# Patient Record
Sex: Male | Born: 1935 | Race: White | Hispanic: No | Marital: Married | Smoking: Former smoker
Health system: Southern US, Community
[De-identification: ages and names within clinical notes are randomized; demographics above are authoritative.]

## PROBLEM LIST (undated history)

## (undated) DIAGNOSIS — N189 Chronic kidney disease, unspecified: Secondary | ICD-10-CM

## (undated) DIAGNOSIS — E785 Hyperlipidemia, unspecified: Secondary | ICD-10-CM

## (undated) DIAGNOSIS — T4145XA Adverse effect of unspecified anesthetic, initial encounter: Secondary | ICD-10-CM

## (undated) DIAGNOSIS — T8859XA Other complications of anesthesia, initial encounter: Secondary | ICD-10-CM

## (undated) DIAGNOSIS — G500 Trigeminal neuralgia: Secondary | ICD-10-CM

## (undated) DIAGNOSIS — M1990 Unspecified osteoarthritis, unspecified site: Secondary | ICD-10-CM

## (undated) DIAGNOSIS — I639 Cerebral infarction, unspecified: Secondary | ICD-10-CM

## (undated) DIAGNOSIS — I10 Essential (primary) hypertension: Secondary | ICD-10-CM

## (undated) DIAGNOSIS — R5383 Other fatigue: Secondary | ICD-10-CM

## (undated) DIAGNOSIS — I359 Nonrheumatic aortic valve disorder, unspecified: Secondary | ICD-10-CM

## (undated) HISTORY — DX: Other fatigue: R53.83

## (undated) HISTORY — DX: Essential (primary) hypertension: I10

## (undated) HISTORY — PX: AORTIC VALVE REPLACEMENT: SHX41

## (undated) HISTORY — DX: Nonrheumatic aortic valve disorder, unspecified: I35.9

## (undated) HISTORY — DX: Hyperlipidemia, unspecified: E78.5

## (undated) HISTORY — DX: Trigeminal neuralgia: G50.0

---

## 1997-10-07 ENCOUNTER — Other Ambulatory Visit: Admission: RE | Admit: 1997-10-07 | Discharge: 1997-10-07 | Payer: Self-pay | Admitting: Cardiology

## 1999-03-16 HISTORY — PX: CORONARY ARTERY BYPASS GRAFT: SHX141

## 1999-06-22 ENCOUNTER — Ambulatory Visit (HOSPITAL_COMMUNITY): Admission: RE | Admit: 1999-06-22 | Discharge: 1999-06-22 | Payer: Self-pay | Admitting: Cardiology

## 1999-06-22 HISTORY — PX: CARDIAC CATHETERIZATION: SHX172

## 1999-07-13 ENCOUNTER — Encounter: Payer: Self-pay | Admitting: Surgery

## 1999-07-15 ENCOUNTER — Encounter: Payer: Self-pay | Admitting: Surgery

## 1999-07-15 ENCOUNTER — Inpatient Hospital Stay (HOSPITAL_COMMUNITY): Admission: RE | Admit: 1999-07-15 | Discharge: 1999-07-23 | Payer: Self-pay | Admitting: Surgery

## 1999-07-15 ENCOUNTER — Encounter (INDEPENDENT_AMBULATORY_CARE_PROVIDER_SITE_OTHER): Payer: Self-pay | Admitting: *Deleted

## 1999-07-16 ENCOUNTER — Encounter: Payer: Self-pay | Admitting: Surgery

## 1999-07-17 ENCOUNTER — Encounter: Payer: Self-pay | Admitting: Surgery

## 1999-07-22 ENCOUNTER — Encounter: Payer: Self-pay | Admitting: Surgery

## 1999-08-11 ENCOUNTER — Encounter (HOSPITAL_COMMUNITY): Admission: RE | Admit: 1999-08-11 | Discharge: 1999-11-09 | Payer: Self-pay | Admitting: Cardiology

## 2000-01-20 ENCOUNTER — Encounter (HOSPITAL_COMMUNITY): Admission: RE | Admit: 2000-01-20 | Discharge: 2000-04-19 | Payer: Self-pay | Admitting: Cardiology

## 2000-04-20 ENCOUNTER — Encounter (HOSPITAL_COMMUNITY): Admission: RE | Admit: 2000-04-20 | Discharge: 2000-06-17 | Payer: Self-pay | Admitting: Cardiology

## 2001-10-31 ENCOUNTER — Ambulatory Visit (HOSPITAL_BASED_OUTPATIENT_CLINIC_OR_DEPARTMENT_OTHER): Admission: RE | Admit: 2001-10-31 | Discharge: 2001-10-31 | Payer: Self-pay | Admitting: Plastic Surgery

## 2002-01-10 ENCOUNTER — Ambulatory Visit (HOSPITAL_COMMUNITY): Admission: RE | Admit: 2002-01-10 | Discharge: 2002-01-10 | Payer: Self-pay | Admitting: Ophthalmology

## 2009-10-20 ENCOUNTER — Encounter: Admission: RE | Admit: 2009-10-20 | Discharge: 2009-10-20 | Payer: Self-pay | Admitting: Neurology

## 2009-11-21 ENCOUNTER — Ambulatory Visit: Payer: Self-pay | Admitting: Cardiology

## 2010-03-20 ENCOUNTER — Ambulatory Visit: Payer: Self-pay | Admitting: Cardiology

## 2010-03-30 ENCOUNTER — Ambulatory Visit: Payer: Self-pay | Admitting: Cardiology

## 2010-06-02 ENCOUNTER — Other Ambulatory Visit: Payer: Self-pay | Admitting: Dermatology

## 2010-07-07 ENCOUNTER — Other Ambulatory Visit: Payer: Self-pay | Admitting: Cardiology

## 2010-07-07 DIAGNOSIS — R52 Pain, unspecified: Secondary | ICD-10-CM

## 2010-07-07 NOTE — Telephone Encounter (Signed)
PT USES [facility] MAIL ORDER AND WANTS HYDROCONDONE REFILLED. ANY QUESTIONS, CALL HOME # LISTED AND PT SAID LEAVE MESG IF NO ANSWER. PLACED CHART IN BOX.

## 2010-07-09 NOTE — Telephone Encounter (Signed)
Dr Patty Sermons signed rx and I faxed it to [facility]

## 2010-07-12 MED ORDER — HYDROCODONE-ACETAMINOPHEN 5-325 MG PO TABS: 1.0000 | ORAL_TABLET | Freq: Three times a day (TID) | ORAL | Status: AC | PRN

## 2010-07-31 NOTE — Op Note (Signed)
NAME:  Michael Blanchard, Michael Blanchard                          ACCOUNT NO.:  [id number]   MEDICAL RECORD NO.:  [id number]                   PATIENT TYPE:  OIB   LOCATION:  2890                                 FACILITY:  [facility]   PHYSICIAN:  Robert L. Dione Booze, [name]               DATE OF BIRTH:  [birth year]   DATE OF PROCEDURE:  01/10/2002  DATE OF DISCHARGE:                                 OPERATIVE REPORT   PREOPERATIVE DIAGNOSES:  Severe blepharochalasis with visual impairment.   POSTOPERATIVE DIAGNOSES:  Severe blepharochalasis with visual impairment.   OPERATION PERFORMED:  Upper eyelid blepharoplasties.   SURGEON:  Robert L. Dione Booze, [name]   ANESTHESIA:  1% Xylocaine with epinephrine.   INDICATIONS FOR PROCEDURE:  The patient was seen in my office on November 29, 2001 with reduced vision and significant difficulty with superior visual  field as well as some redundant skin of his upper eyelid.  It could be  corrected to 20/20 but almost had to lift up the skin to read.  The margin  reflex distance was about 1 to 2 mm but actually dropped down to 0 when he  was relaxed.  Confrontation fields were reduced superiorly.  The pupils,  motility, conjunctiva, cornea, anterior chamber and fundus exam was negative  and he does have early cataracts.  He has very severe blepharochalasis with  the skin completely covering his eyelashes and all virtually covering part  of each pupil.  This was documented with photographs and visual field  testing.  He decided he would opt for blepharoplasties and the plan was for  him to stop his aspirin seven days ahead of time.  He was followed medically  by Dr. Ronny Flurry.   JUSTIFICATION FOR PERFORMING PROCEDURE IN OUTPATIENT SETTING:  Routine.   JUSTIFICATION FOR OVERNIGHT STAY:  None.   DESCRIPTION OF PROCEDURE:  The patient arrived in the minor surgery room and  was prepped and draped in routine fashion.  A frontal nerve block was given  and some 1% Xylocaine  with epinephrine and was given to the skin of each  upper eyelid.  The skin to be removed was carefully demarcated and then  excised along with some underlying fatty tissue and bleeding was controlled  with the cautery and pressure.  Please note that there was a significant  amount of bleeding, much more than usual.  Each wound was closed with a  running 6-0 nylon suture and pressure patches were applied.  The patient  left the minor room having done well.    FOLLOW UP:  Patient to be seen in my office in five days to have the sutures  removed.  He is to remove the patches in several hours.  He is to use warm  compresses twice daily.  He is to use Polysporin ointment in his eyes at  night.  Robert L. Dione Booze, [name]    RLG/MEDQ  D:  01/10/2002  T:  01/10/2002  Job:  [id number]   cc:   Cassell Clement, MD

## 2010-07-31 NOTE — H&P (Signed)
[place]. [facility]  Patient:    Michael Blanchard, Michael Blanchard                         MRN: [id number] Adm. Date:  06/22/99 Attending:  Peter M. Swaziland, [name] CC:         Clovis Pu. Patty Sermons, [name]                         History and Physical  CHIEF COMPLAINT: Mr. Farquharson is a 77 year old white male who has a known history of severe aortic stenosis.  Recently he has been experiencing some symptoms of chest discomfort, particularly with strenuous exertion or exercise.  HISTORY OF PRESENT ILLNESS: Last week he has an episode with sudden light headed feeling where he felt swimmy-headed, and afterward developed tightness around his chest.  This was relieved with rest.  Due to the fact that he is now symptomatic it was recommended that he undergo evaluation for possible aortic valve replacement. The patient did have an echocardiogram in August 2000 which showed a peak gradient of 82 mmHg with severe aortic stenosis.  He had moderate LVH with normal systolic function.  He had an echocardiogram repeated yesterday which reportedly showed o change, but that report is not yet available.  PAST MEDICAL HISTORY:  1. Remote history of rectal bleeding 20 years ago.  2. Previous fracture of the arm.  PAST SURGICAL HISTORY: No prior surgery.  ALLERGIES: No known drug allergies.  CURRENT MEDICATIONS:  1. Cozaar 50 mg q.d.  2. Glucosamine t.i.d.  3. Phenergan capsules t.i.d.  4. Enteric-coated aspirin 325 mg q.d.  5. Vitamin E 400 IU q.d.  6. Aleve 2 tablets q.d.  7. Lipitor 10 mg q.d.  8. Multivitamin q.d.  SOCIAL HISTORY: The patient is married and has four children, in good health. e denies tobacco or alcohol use.  He drinks a moderate amount of caffeine.  He is  semi-retired from the trucking business.  FAMILY HISTORY: Father died at age 69 with a blood clot to the brain.  Mother died at age 21 with a stroke and cerebral hemorrhage.  One brother died with  heart disease.  REVIEW OF SYSTEMS: As per HPI.  PHYSICAL EXAMINATION:  GENERAL: The patient is an elderly white male in no apparent distress.  VITAL SIGNS: Blood pressure 164/82, pulse 80 with frequent extrasystole.  HEENT: PERRL.  Conjunctivae clear.  Oropharynx clear without upper and lower dental plates.  He is edentulous.  NECK: Supple, without JVD, adenopathy, thyromegaly.  His carotid upstrokes are markedly diminished and delayed with radiated murmur.  LUNGS: Clear.  CARDIAC: Regular rate and rhythm with normal S1 and soft S2, with grade 3/6 harsh systolic murmur heard best at the aortic area and radiating to the left sternal  border.  There is no diastolic murmur.  PMI is prominent.  ABDOMEN: Soft, nontender, without masses or hepatosplenomegaly.  EXTREMITIES: Without edema or cyanosis.  Pulses are 2+ and symmetric.  NEUROLOGIC: Nonfocal.  LABORATORY DATA: ECG shows normal sinus rhythm with frequent PVCs, probable LVH; ST-T wave changes; consider lateral and inferior ischemia.  Chest x-ray showed o active disease.  Cholesterol was 179, LDL 122, HDL 42, triglycerides 77.  Coags were normal except for PTT slightly high at 34.  CBC showed WBC 6800, hemoglobin 15.5, hematocrit 45.4, platelets 175,000.  IMPRESSION:  1. Severe aortic stenosis, degenerative, now symptomatic.  2. Borderline hypercholesterolemia.  3. Hypertension.  PLAN: The patient will be admitted for right and left heart catheterization, coronary angiography.  Further therapy pending these results. DD:  06/21/99 TD:  06/22/99 Job: [id number]

## 2010-07-31 NOTE — Cardiovascular Report (Signed)
[place]. [facility]  Patient:    Michael Blanchard, Michael Blanchard                         MRN: [id number] Proc. Date: 06/22/99 Attending:  Peter M. Swaziland, [name] CC:         Alleen Borne, [name]             Thomas A. Patty Sermons, [name]                        Cardiac Catheterization  INDICATION FOR PROCEDURE:  The patient is a 77 year old white male who presents  with recent symptoms with chest discomfort and shortness of breath.  He has known severe aortic stenosis.  ACCESS:  Via the right femoral artery and vein using the standard Seldinger technique.  EQUIPMENT:  A 6 French 4 cm right and left Judkins catheter, 6 French pigtail catheter, 6 French arterial sheath, 7 French arterial sheath, 8 French venous sheath, 7 French balloon-tipped Swan-Ganz catheter.  MEDICATIONS:  Local anesthesia with 1% Xylocaine.  CONTRAST:  Omnipaque 125 cc.  COMMENTARY:  The patient tolerated the procedure well without complications.  HEMODYNAMIC DATA:  Right atrial pressure is 18/14 with a mean of 14 mmHg. Right ventricular pressure is 58 with a EDP of 14 mmHg.  Pulmonary artery pressures 54/29 with a mean of 39 mmHg.  Pulmonary capillary wedge pressure is 28/25 with a mean of 23 mmHg.  Aortic pressure is 152/82 with a mean of 105.  Left ventricular pressure is 176 with an EDP of 27 mmHg.  By simultaneous recordings, aortic valve gradient is 29 mmHg with an aortic valve area of 0.9 cm sq.  Cardiac output by thermodilution is 5 L/min. with an index of 2.28.  By Hiram Comber, cardiac output is 3.95 with an index of 1.80.  There is no shunt.  There is no significant mitral valve gradient.  ANGIOGRAPHIC DATA:  LEFT VENTRICULAR ANGIOGRAPHY:  Left ventricular angiography is performed in the RAO view.  This demonstrates mild left ventricular enlargement.  There is severe hypokinesia of the mid to distal inferior wall and apex.  Overall, there is moderate left ventricular dysfunction with  ejection fraction estimated at 45%.  There is no mitral regurgitation or prolapse.  The aortic valve appears heavily  calcified with reduced mobility.  CORONARY ANGIOGRAPHY:  Left coronary artery:  The left coronary artery arises and distributes normally.  Left main:  The left main coronary artery is calcified without significant disease.  Left anterior descending:  The left anterior descending artery is diffusely diseased proximally in a segmental fashion with diffuse 80% narrowing.  There is an intermediate vessel which is somewhat small in caliber which has a 0% stenosis at its ostium.  Left circumflex:  The left circumflex coronary artery is a large dominant vessel. The first marginal vessel bifurcated into two large marginal vessels.  The second of these branches has a 90% stenosis proximally.  The ongoing circumflex at this point has a 50% narrowing before it terminates into the PDA.  Right coronary artery:  The right coronary artery is a small nondominant vessel. It has a 90% stenosis in the mid vessel prior to the terminal right ventricular  marginal branch.  FINAL INTERPRETATION: 1. Severe three-vessel obstructive atherosclerotic coronary artery disease. 2. Moderate to severe aortic stenosis. 3. Moderate left ventricular dysfunction. 4. Moderate pulmonary hypertension.  PLAN:  Would recommended coronary artery bypass surgery and  aortic valve replacement. DD:  06/25/99 TD:  06/26/99 Job: [id number]

## 2010-07-31 NOTE — Discharge Summary (Signed)
[place]. [facility]  Patient:    Michael Blanchard, Michael Blanchard                       MRN: [id number] Adm. Date:  [id number] Disc. Date: [id number] Attending:  [name] Dictator:   Eugenia Pancoast, P.A. CC:         Alleen Borne, [name]             Thomas A. Patty Sermons, [name]             Peter M. Swaziland, [name]                           Discharge Summary  DATE OF BIRTH:  [birth year]  FINAL DIAGNOSES: 1. Coronary artery disease. 2. Aortic valve stenosis. 3. Hypertension. 4. Hyperlipidemia. 5. Benign prostatic hypertrophy.  PROCEDURES:  Jul 15, 1999, coronary artery bypass graft x 5 with LIMA to the LAD, sequential saphenous vein graft to the first and second obtuse marginals, saphenous vein graft to the acute marginal, and a saphenous vein graft to the intermediate branch, aortic valve replacement with a #23 Carpentier-Edwards pericardial tissue valve.  SURGEON:  Dr. Laneta Simmers.  HISTORY OF PRESENT ILLNESS:  This is a 77 year old gentleman with known history of aortic valve stenosis who reports a several-month history of decreased exercise ______ .  Approximately two to three weeks prior to this admission he had an episode of sudden flushed feeling, lightheadedness, and dizziness, followed by a tight squeezing sensation around his chest.  This was relieved with rest.  The patient had never had any of these symptoms before. He had no associated shortness of breath.  He had no lower extremity edema. Denied any PND or orthopnea.  He had an echocardiogram done August 2000, which revealed a peak transaortic valvular gradient of 82 mmHg, consistent with severe aortic valve stenosis and moderate left ventricular hypertrophy with normal systolic function at that time.  His echocardiogram was repeated on June 21, 1999, which showed no significant change.  He subsequently underwent a cardiac catheterization performed by Dr. Peter Swaziland on June 22, 1999, which  showed severe aortic valve stenosis with an aortic valve gradient of 29 mmHg and a calculated aortic valve area of 0.9 sq cm.  The patient had moderate pulmonary hypertension with a PA pressure of ______ and a wedge pressure of 20/25 with a mean of 32.  His right ventricular pressure was 58/16 and his mean right atrial pressure was 14.  His left ventriculogram showed severe inferior hypokinesis with an ejection fraction of 45%.  Aortic valve was heavily calcified.  There was trivial mitral regurgitation.  There was a 78% long segmental stenosis of the proximal LAD.  The circumflex was dominant, with a 99% stenosis of the second marginal branch.  There was also a 90% ostial stenosis of a small intermediate artery.  The right coronary artery was a small nondominant vessel with a 90% mid vessel stenosis.  Because of these findings, he was referred to Dr. Laneta Simmers for surgical revascularization. Dr. Laneta Simmers spoke with the patient.  He discussed the surgery.  Risks and benefits were explained to the patient.  The patient understood and agreed to surgery.  HOSPITAL COURSE:  The patient was admitted to [facility] on Jul 15, 1999.  At that time he underwent coronary artery bypass x 5 with an LIMA to the LAD, sequential saphenous vein graft to  the first and second obtuse marginals, a saphenous vein graft to the intermediate branch, a saphenous vein graft to the acute marginal.  He then underwent aortic valve replacement with a #23 Carpentier-Edwards pericardial tissue valve.  The patient tolerated the procedure well.  No intraoperative complications occurred.  Postoperatively, the patient did quite well.  No untoward events occurred during his stay.  His first postoperative day, he was ready for transfer to the stepdown unit. There, he continued to progress satisfactorily.  He went through his cardiac rehabilitation without difficulty.  He was up and ambulating by the second postoperative day.   He was started on his Coumadin.  The patient did have some evidence of atrial fibrillation that was noted.  At the time of discharge, he was in sinus rhythm.  He had no other complaints during his progress.  His incisions were healing satisfactorily.  There was noted redness surrounding the lower extremity vein harvest incision.  The patient was started on Keflex prophylactically for this and would continue on this as an outpatient.  The patient continued to do well.  There were no signs of any overt infection.  By Jul 22, 1999, he was doing quite well, tolerating a diet satisfactorily, having no complaints, and was at this time prepared for discharge.  DISCHARGE MEDICATIONS: 1. Lasix 40 mg q.d. x 7 days. 2. K-Dur 20 mEq q.d. x 7 days. 3. Keflex 500 mg t.i.d. x 8 days. 4. Flomax 0.4 mg q.d. 5. Coumadin 5 mg as directed. 6. Zocor 20 mg q.d. 7. Cozaar 50 mg q.d. 8. Lopressor 75 mg b.i.d. 9. Percocet 1-2 p.o. q.4-6h. p.r.n. pain.  FOLLOW-UP:  The patient will follow up with Dr. Patty Sermons in two weeks.  He will see Dr. Laneta Simmers initially on Tuesday, Jul 28, 1999, at 12:15.  Dr. Laneta Simmers would like to follow him up to recheck his incision at that time.  DISCHARGE INSTRUCTIONS:  The patient will continue on Coumadin as an outpatient.  His INR was 2.3 on Jul 22, 1999.  At the time of this dictation, the discharge INR was pending.  The patient was given 2.5 mg of Coumadin at that time on Jul 22, 1999.  The patient will have his blood drawn at home by home health nurses on Friday, and the results will be called to Dr. Jenness Corner office.  Dr. Patty Sermons should take care of the patients Coumadin dosing.  DISPOSITION:  The patient is discharged home on Jul 23, 1999.  CONDITION ON DISCHARGE:  Satisfactory and stable condition. DD:  07/22/99 TD:  07/24/99 Job: [id number]

## 2010-07-31 NOTE — Op Note (Signed)
[place]. [facility]  Patient:    OTHAR, CURTO                       MRN: [id number] Proc. Date: 07/15/99 Adm. Date:  [id number] Attending:  [name] CC:         Alleen Borne, [name]             CVTS Office             Thomas A. Patty Sermons, [name]             Cardiac Catheterization Lab                           Operative Report  PREOPERATIVE DIAGNOSIS:  Severe three-vessel coronary artery disease.  Severe aortic stenosis.  POSTOPERATIVE DIAGNOSIS:  Severe three-vessel coronary artery disease.  Severe aortic stenosis.  OPERATIVE PROCEDURES: 1. Median sternotomy. 2. Extracorporeal circulation. 3. Coronary artery bypass graft surgery x 5.  Using the left internal mammary    artery graft to the left anterior descending coronary artery, with a    saphenous vein graft to the intermediate coronary artery, a sequential    saphenous vein graft to the first and second obtuse marginal branch of the    left circumflex coronary artery, and a saphenous vein graft to the acute    marginal branch of the right coronary artery; and aortic valve replacement    using a 23 mm Carpentier-Edwards pericardial valve.  ATTENDING SURGEON:  Alleen Borne, [name]  ASSISTANT:  Salvatore Decent. Cornelius Moras, [name]  SECOND ASSISTANT:  Eugenia Pancoast, P.A.  ANESTHESIA:  General endotracheal.  CLINICAL HISTORY:  This patient is a 77 year old gentleman with a known history of aortic stenosis, who reported a several-month history of decreased exercise tolerance and fatigue.  Approximately two to three weeks prior to presentation he had an episode of lightheadedness and dizziness, with a sudden flushed feeling, followed by a tight squeezing sensation around the chest.  An echocardiogram on June 21, 1999 showed no significant change from an echocardiogram done August 2000, which showed a peak transvalvular gradient across the aortic valve of 82 mmHg.  This consistent with severe  aortic stenosis, as well as moderate left ventricular hypertrophy and normal left ventricular systolic function.  He underwent cardiac catheterization on March 24, 1999, which showed severe aortic stenosis with an aortic valve gradient of 29 mmHg and a calculated valve area of 0.9 sq cm.  There was moderate pulmonary hypertension, with a PA pressure of 54/29; a wedge pressure of 28, with a mean wedge pressure of 33. His right ventricular pressure was elevated at 58/16, with a mean right atrial pressure of 14.  Left ventriculography showed severe inferior hypokinesis with an ejection fraction of 45%.  The aortic valve was heavily calcified.  There was trivial mitral regurgitation.  There is a 70-80% long segmental stenosis in the proximal LAD.  The left circumflex is dominant, with a 99% stenosis of the second marginal branch.  There was also some narrowing of the proximal portion of the left circumflex.  There was a 90% ostial stenosis in the small intermediate artery.  The right coronary artery was a small nondominant vessel with a 90% mid vessel stenosis prior to the acute marginal branch.  After review of the angiogram with the examiner and the patient, it was felt that coronary artery bypass graft surgery and aortic valve  replacement was the best treatment.  I discussed the operative procedure with the patient and his wife, including the alternatives, benefits, and risks (including: bleeding, possible blood transfusion, infection, stroke, myocardial infarction and death).  They understood and agreed to proceed.  We also discussed the type of valve to use, and I felt that a pericardial valve would be the best choice, given his age of 89 with three-vessel coronary disease.  Also significant arthritis and frequent use of nonsteroidal anti-inflammatory agents.  We discussed the pros and cons of tissue versus mechanical valves; felt that a tissue valve would be the best choice for him.   He and his wife understood all of this and agreed with that decision.  OPERATIVE PROCEDURE:  The patient was taken to the operating room and placed on the table in the supine position.  After induction of general endotracheal anesthesia, a Foley catheter was placed in the bladder using sterile technique.  Then, a transesophageal echocardiogram was performed by Cardiology.  This showed left ventricular hypertrophy with good overall left ventricular systolic function, but severe inferior hypokinesis.  The aortic valve was heavily calcified and stenotic.  There is no significant mitral regurgitation, but there was mitral annular calcification.  Then, the chest, abdomen and both lower extremities were prepped and draped in the usual sterile manner.  The chest was entered through a median sternotomy incision.  The pericardium left of the midline.  Examination of the heart showed good ventricular contractility.  The ascending aorta had no palpable plaques in it.  Then, the left internal mammary artery was harvested from the chest wall to the ______ .  This was a medium-caliber vessel, with excellent blood flow through it.  At the same time, a 7 mm greater saphenous vein was harvested from the right leg; this vein was of medium size and good quality.  Then the patient was heparinized and when adequate ACT was achieved, the distal ascending aorta was cannulated using a 22-French aortic cannula for arterial inflow.  Venous outflow was achieved using a large two-stage venous cannula through the right atrial appendage.  An antegrade cardioplegia and vent cannula was inserted in the aortic root.  A retrograde cardioplegia cannula was inserted through the right atrium into the coronary sinus; and a left ventricular vent was inserted through the the right superior pulmonary vein.  The patient was placed on cardiopulmonary bypass and the distal coronaries identified.  The LAD was a large,  graftable vessel with no significant distal  disease in it.  The intermediate coronary artery was a small to medium-sized vessel, and was graftable.  The first marginal was a small to medium-sized vessel that was graftable.  The second marginal branch was a large vessel that was diffusely diseased, extending out to the end of the artery.  The distal portion of the left circumflex terminated as a small, bifurcating branch, that was lying quite high on the posterior wall; was not felt to be graftable.  The right coronary artery gave off a small to medium-sized acute marginal branch that was suitable for grafting.  Then the aorta was crossclamped and 500 cc of cold blood antegrade cardioplegia was administered in the aortic root, with quick arrest of the heart.  This was followed by 300 cc of cold blood retrograde cardioplegia. Systemic hyperthermia at 20 degrees Centigrade and topical hyperthermia iced saline was used.  The temperature probe was placed in the subcutaneous insulating pad and the pericardium.  The first distal anastomosis was performed  to the first marginal branch.  The internal diameter was 1.6 mm.  The conduit used was a 7 mm greater saphenous vein.   The anastomosis performed in a sequential side-to-side manner using continuous 7-0 Prolene suture.  Flow was admitted to the graft, and it was excellent.  The second distal anastomosis was performed to the second marginal branch. The internal diameter was 1.75 mm.  Conduit used was the same 7 mm greater saphenous vein.  The anastomosis was performed in a sequential end-to-side manner, using continuous 7-0 Prolene suture. Flow was admitted to the graft; it was excellent.  Then another dose of cardioplegia was given in the retrograde manner.  The third distal anastomosis was performed to the intermediate coronary artery.  The internal diameter was 1.6 mm.  The conduit used was a second 7 mm greater saphenous vein.  The  anastomosis was performed in an end-to-side manner using continuous 7-0 Prolene suture.   Flow was admitted through the graft; it was excellent.  The fourth distal anastomosis was performed to the acute marginal branch.  The internal diameter was 1.5 mm.  The conduit used was a third 7 mm greater saphenous vein.  The anastomosis was performed in an end-to-side manner using continuous 7-0 Prolene suture.  Flow was admitted through the graft; it was excellent.  Then, another dose of retrograde cardioplegia was given. Additional doses were given at approximately 20-30 min intervals, maintaining myocardial temperature around 10 degrees Centigrade.  Then, the attention was turned to the fifth distal anastomosis.  This was performed through the mid portion of the left anterior descending coronary artery.  The internal diameter was about 2.5 mm.  The conduit used was the left internal mammary artery.  This was brought through an opening in the left pericardium, anterior to the phrenic nerve.  It was then anastomosed to the LAD in an end-to-side manner using continuous 8-0 Prolene suture.  The pedicle was tacked to epicardium with 6-0 Prolene sutures.  Then, attention was turned to aortic valve replacement.  The aorta was opened at the sinotubular junction in a transverse fashion.  Examination of the native aortic valve showed that it was a three-leaflet valve, with heavy calcification of the leaflets (which were immobile).  There was also heavy annular calcification.  The native valves were excised.  The annulus was decalcified with rongeurs, taking care to remove all particulate debris. Then the aortic root and left ventricle were irrigated with large volume of iced saline.  The annulus was sized and a 23 mm Carpentier-Edwards pericardial valve was chosen. Then a series of pledgeted 2-0 Ethibond horizontal mattress sutures were placed around the annulus, with the pledgets in a  subangular position.  The sutures were placed through the sewing ring and the valve lowered into place.  The sutures were then tied sequentially.  The valve seated nicely and there was no subvalvular obstruction.  The patient was then rewarmed to 37 degrees Centigrade.  The aortotomy was closed in two layers using continuous 4-0 Prolene suture, with pledgets on both ends.  Before tying the suture, the left side of the heart was deaired as much as possible.  The sutures were then tied.  The patients head was placed in a Trendelenburg position.  The clamp was removed from the aortic pedicle. There was rapid warming with ventricular septum.  The crossclamp was then removed; time at 141 minutes.  There was spontaneous return of ventricular fibrillation, and the patient was then defibrillated into sinus rhythm.  Then, a partial occlusion clamp was placed in the aortic root. The three proximal vein graft anastamoses were performed in end-to-side manner using continuous 6-0 Prolene suture.  The clamp was removed and the vein graft deaired, and the clasp in front of it.  The proximal and distal anastomosis appeared hemostatic.  The lie of the graft was satisfactory.  Graft margins were placed around the proximal anastomosis.  Two temporary right ventricular and right atrial pacing wires were placed and brought up through the skin.  With the patient having been rewarmed to 37 degrees Centigrade, further deairing maneuvers were performed.  Then the patient was weaned from cardiopulmonary bypass on low-dose Dopamine.  Total bypass time was 210 mins. Cardiac function appeared excellent, with cardiac output of 7 mm/min.  A transesophageal echocardiogram showed a normal functioning aortic valve prosthesis, with no evidence of paravalvular leak.  There was no significant mitral regurgitation.  Left ventricular function appeared good.  Then the patient was given Protamine intravenously.  Aortic  cannulae were removed.  The left ventricular vent and the retrograde cardioplegia cannulae had already been removed.  Hemostasis was achieved.  The patient was given platelets due to low platelet counts and diffuse oozing from the start of the case.  Then, three chest tubes were placed to the posterior pericardium; one  in the left pleural space, one in the antrum and one in the mediastinum.  The pericardium was reapproximated over the heart.  The sternum was closed with #6 stainless steel wires.  The fascia was closed with a continuous #1 Vicryl suture.  Subcutaneous tissue was closed using 2-0 and 3-0 Vicryl.  The skin was closed with 3-0 Vicryl subcuticular closure.  Lower extremity graft harvest site was closed in a similar manner.  The sponge, needle and instrument counts were correct at the close of the procedure.  Dry sterile dressings were applied over the incisions, around the chest tubes; which were electrocauterized and suctioned.  The patient remained hemodynamically stable; was transported to the SICU in guarded but stable condition. DD:  07/15/99 TD:  07/19/99 Job: [id number]

## 2010-07-31 NOTE — Procedures (Signed)
[place]. [facility]  Patient:    Michael Blanchard, Michael Blanchard                       MRN: [id number] Proc. Date: 07/15/99 Adm. Date:  [id number] Attending:  Cleatrice Blanchard CC:         Michael Blanchard, [name]                           Procedure Report  PROCEDURE PERFORMED:  Transesophageal echocardiogram.  ANESTHESIOLOGIST:  Michael Blanchard, [name]  INDICATIONS FOR PROCEDURE:  The patient is a 77 year old gentleman patient of Dr. Evelene Blanchard, who presented today as an outpatient for coronary artery bypass grafting and aortic valve replacement for his coronary artery disease and aortic stenosis.  Our plan is to place a TEE probe for evaluation of cardiac function and structures and post valvular replacement.  DESCRIPTION OF PROCEDURE:  The patient was brought to the holding area the morning or surgery.  Radial arterial and pulmonary lines were inserted under local anesthesia with sedation.  He was then removed to the operating room for routine induction of general anesthesia, the trachea was intubated.  The TEE probe was then passed oropharyngeally into the stomach and then withdrawn for imaging of the cardiac structures.  Precardiopulmonary TEE examination:  The left ventricle:  This is concentrically a hypertrophied left ventricular chamber.  No masses are noted within.  The papillary muscles were well outlined.  Overall contractility is satisfactory in that there is a satisfactory contractile pattern and then thickening of the wall in the anterior lateral segments primarily.  There is hypocontractility  noted in the inferior and septal walls.  Mitral valve:  This is a sclerotic mitral valve apparatus, thickened both anterior and posterior leaflets.  There is annular calcification noted.  It does appear to coapt below the level of the annulus in appropriate fashion and would open appropriately during diastolic filling.  On Doppler examination, there is a very  trace mitral regurgitant flow noted.  Left atrium:  This is a normal left atrial chamber.  The appendages are outlined.  No masses are noted within.  Pulmonary vein visualized.  The interatrial septum is intact.  Aortic valve:  The aortic valve was noted immediately to be heavily calcified. There were three cusps apparent.  Motion was severely limited in both the short axis and long axis views with a very small opening centrally located. On Doppler examination in the long axis view, there is a central, small 1+ aortic insufficiency jet.  On Doppler examination above the level of the valve, there is a significant what would be considered a 3 to 4+ turbulent jet noted above the level of the aortic valve during systolic contraction.  There are no free or mobile attachments to this valve.  Right ventricle:  This is a normal trabeculated right ventricular chamber.  Tricuspid valve:  Normal valve.  Right atrium:  Normal right atrial chamber.  The patient was placed on cardiopulmonary bypass, hypothermia was induced. The diseased aortic valve was excised and replaced with a #23 pericardial tissue valve.  Coronary artery bypass grafting was subsequently carried out. The patient rewarmed and separated from cardiopulmonary bypass with the initial attempt after deairing maneuvers are carried out.  Postcardiopulmonary bypass transesophageal echocardiogram examination (limited exam):  Left ventricle in the early bypass period:  This is a signficantly depressed left ventricular chamber in the inferior septal wall  areas.  There is satisfactory contractility noted in the anterior and anterolateral walls. With the institution of inotropic support there is subsequent improvement in the contractile pattern in all segments including the septal and inferior walls.  The left ventricular chamber remains thickened but is of satisfactoroy filling and appropriate contractility.  Aortic valve:  In place  of the diseased aortic valve is now seen the struts of the pericardial tissue valve.  On closer examination, we are able to see the leaflets opening appropriately during systolic contraction  and closing in satisfactory manner.  On the long axis view there was one small eccentric-looking jet of aortic insufficiency.  It was difficult to tell whether this was perivalvular or within the valvular leak.  On multiple views it was not able to determine where this leak was originating; however, it was very small in its size.  This was considered of no hemodynamic significance at this time.  The valve in both short axis and long axis appeared to appropriately seated and functioned entirely in appropriate manner.  The rest of the cardiac examination was as previously described.  The patient was returned to the cardiac intensive care unit in stable condition. DD:  07/15/99 TD:  07/17/99 Job: [id number]

## 2010-08-06 ENCOUNTER — Telehealth: Payer: Self-pay | Admitting: Cardiology

## 2010-08-06 NOTE — Telephone Encounter (Signed)
Needs to get handicap sign, wife has shigles can he get them, he has several bumps that are under his arm pits and he thinks maybe someone should look at.

## 2010-08-06 NOTE — Telephone Encounter (Signed)
Wife recently had shingles and he now has places under both arms that are itching, denies pain.  Does not know if he has had chicken pox. Also there are three small sores on arm that are painful and one looks like it may have a "puss pocket".  And lastly, should he get shingles vaccine?  Please advise

## 2010-08-06 NOTE — Telephone Encounter (Signed)
His bilateral symptoms do not sound like shingles.  However he should get a shingles vaccine if he has not had it.

## 2010-08-06 NOTE — Telephone Encounter (Signed)
Advise patient.  Patient stated he may go to dermatologist.  Advised if elbow worse with the antibiotic ointment, needs to get it evaluated by MD. rx for shingles given to patient.

## 2010-09-14 ENCOUNTER — Encounter: Payer: Self-pay | Admitting: Cardiology

## 2010-09-18 ENCOUNTER — Other Ambulatory Visit: Payer: Self-pay | Admitting: *Deleted

## 2010-09-18 DIAGNOSIS — E785 Hyperlipidemia, unspecified: Secondary | ICD-10-CM

## 2010-09-18 DIAGNOSIS — Z79899 Other long term (current) drug therapy: Secondary | ICD-10-CM

## 2010-09-22 ENCOUNTER — Encounter: Payer: Self-pay | Admitting: Cardiology

## 2010-09-24 ENCOUNTER — Other Ambulatory Visit (INDEPENDENT_AMBULATORY_CARE_PROVIDER_SITE_OTHER): Payer: Medicare Other | Admitting: *Deleted

## 2010-09-24 ENCOUNTER — Encounter: Payer: Self-pay | Admitting: Cardiology

## 2010-09-24 ENCOUNTER — Telehealth: Payer: Self-pay | Admitting: *Deleted

## 2010-09-24 ENCOUNTER — Ambulatory Visit (INDEPENDENT_AMBULATORY_CARE_PROVIDER_SITE_OTHER): Payer: Medicare Other | Admitting: Cardiology

## 2010-09-24 DIAGNOSIS — E7800 Pure hypercholesterolemia, unspecified: Secondary | ICD-10-CM

## 2010-09-24 DIAGNOSIS — E785 Hyperlipidemia, unspecified: Secondary | ICD-10-CM

## 2010-09-24 DIAGNOSIS — Z954 Presence of other heart-valve replacement: Secondary | ICD-10-CM

## 2010-09-24 DIAGNOSIS — Z951 Presence of aortocoronary bypass graft: Secondary | ICD-10-CM

## 2010-09-24 DIAGNOSIS — Z79899 Other long term (current) drug therapy: Secondary | ICD-10-CM

## 2010-09-24 DIAGNOSIS — I119 Hypertensive heart disease without heart failure: Secondary | ICD-10-CM | POA: Insufficient documentation

## 2010-09-24 DIAGNOSIS — Z953 Presence of xenogenic heart valve: Secondary | ICD-10-CM

## 2010-09-24 DIAGNOSIS — Z9889 Other specified postprocedural states: Secondary | ICD-10-CM

## 2010-09-24 DIAGNOSIS — M1990 Unspecified osteoarthritis, unspecified site: Secondary | ICD-10-CM

## 2010-09-24 LAB — CBC WITH DIFFERENTIAL/PLATELET
Basophils Absolute: 0 10*3/uL (ref 0.0–0.1)
Basophils Relative: 0.4 % (ref 0.0–3.0)
Eosinophils Absolute: 0.2 10*3/uL (ref 0.0–0.7)
Lymphocytes Relative: 25.7 % (ref 12.0–46.0)
MCHC: 34.3 g/dL (ref 30.0–36.0)
Monocytes Absolute: 0.6 10*3/uL (ref 0.1–1.0)
Neutrophils Relative %: 63.5 % (ref 43.0–77.0)
Platelets: 191 10*3/uL (ref 150.0–400.0)
RBC: 4.3 Mil/uL (ref 4.22–5.81)

## 2010-09-24 LAB — HEPATIC FUNCTION PANEL
AST: 19 U/L (ref 0–37)
Alkaline Phosphatase: 89 U/L (ref 39–117)
Bilirubin, Direct: 0.1 mg/dL (ref 0.0–0.3)
Total Bilirubin: 0.5 mg/dL (ref 0.3–1.2)

## 2010-09-24 LAB — BASIC METABOLIC PANEL
Calcium: 9.3 mg/dL (ref 8.4–10.5)
GFR: 68.7 mL/min (ref >60.00)
Glucose, Bld: 89 mg/dL (ref 70–99)
Potassium: 4.7 mEq/L (ref 3.5–5.1)
Sodium: 139 mEq/L (ref 135–145)

## 2010-09-24 LAB — LIPID PANEL
Cholesterol: 180 mg/dL (ref 0–200)
HDL: 46.7 mg/dL (ref >39.00)
LDL Cholesterol: 103 mg/dL — ABNORMAL HIGH (ref 0–99)
VLDL: 30.8 mg/dL (ref 0.0–40.0)

## 2010-09-24 NOTE — Assessment & Plan Note (Signed)
Patient has a history of hypercholesterolemia.  He has been careful with his diet.  Blood work today is pending.He is on Crestor and Zetia

## 2010-09-24 NOTE — Progress Notes (Signed)
Michael Blanchard Date of Birth:  02-Jul-1925 [facility] / [facility] [address]   [address] [place], [place]  [address] [phone]           Fax   [phone]  History of Present Illness: This pleasant elderly gentleman is seen for a scheduled followup office visit.  He had aortic valve replacement and coronary artery bypass graft surgery in 2001.  He has a pericardial tissue valve.  Dr. Laneta Simmers was his surgeon.  He has not been experiencing any recurrent chest pain or angina.  He's not having any symptoms of congestive heart failure.  Patient also has a past history of trigeminal neuralgia.  He has been evaluated for this at [facility].  He has a history of dyslipidemia and is on Crestor and Zetia his last nuclear stress test was 05/06/05 and showed no evidence of ischemia and he had normal LV function with an ejection fraction of 59% his last echocardiogram was 05/02/08 and showed normal left atrial systolic function with impaired relaxation and normal tissue valve aortic valve replacement with no aortic regurgitation  Current Outpatient Prescriptions  Medication Sig Dispense Refill  . ACAI PO Take by mouth. Taking 2 daily       . aspirin 81 MG EC tablet Take 81 mg by mouth daily.        Marland Kitchen CALCIUM PO Take by mouth daily.        Marland Kitchen CRANBERRY PO Take by mouth. Taking  2000 daily       . Docusate Calcium (STOOL SOFTENER PO) Take by mouth. As needed       . ezetimibe (ZETIA) 10 MG tablet Take 10 mg by mouth daily.        Marland Kitchen HYDROcodone-acetaminophen (NORCO) 5-325 MG per tablet Take 1 tablet by mouth every 6 (six) hours as needed.        Marland Kitchen losartan-hydrochlorothiazide (HYZAAR) 100-25 MG per tablet Take 1 tablet by mouth daily.        . metoprolol (LOPRESSOR) 50 MG tablet Take 75 mg by mouth 2 (two) times daily.        . Multiple Vitamin (MULTIVITAMIN) tablet Take 1 tablet by mouth daily.        . nitroGLYCERIN (NITROSTAT) 0.4 MG SL tablet Place 0.4 mg under the tongue every 5  (five) minutes as needed.        . rosuvastatin (CRESTOR) 10 MG tablet Take 5 mg by mouth every other day.          Allergies  Allergen Reactions  . Gabapentin   . Lipitor (Atorvastatin Calcium)   . Niaspan (Niacin)     Patient Active Problem List  Diagnoses  . Benign hypertensive heart disease without heart failure  . Hx of CABG  . S/P aortic valve replacement with bioprosthetic valve  . Hypercholesterolemia  . Osteoarthritis    History  Smoking status  . Former Smoker  . Quit date: 09/13/1985  Smokeless tobacco  . Not on file    History  Alcohol Use No    Family History  Problem Relation Age of Onset  . Stroke Mother   . Aneurysm Mother   . Aneurysm Father   . Heart disease Brother     Review of Systems: Constitutional: no fever chills diaphoresis or fatigue or change in weight.  Head and neck: no hearing loss, no epistaxis, no photophobia or visual disturbance. Respiratory: No cough, shortness of breath or wheezing. Cardiovascular: No chest pain peripheral  edema, palpitations. Gastrointestinal: No abdominal distention, no abdominal pain, no change in bowel habits hematochezia or melena. Genitourinary: No dysuria, no frequency, no urgency, no nocturia. Musculoskeletal:No arthralgias, no back pain, no gait disturbance or myalgias. Neurological: No dizziness, no headaches, no numbness, no seizures, no syncope, no weakness, no tremors. Hematologic: No lymphadenopathy, no easy bruising. Psychiatric: No confusion, no hallucinations, no sleep disturbance.    Physical Exam: Filed Vitals:   09/24/10 0843  BP: 146/84  Pulse: 76  The general appearance reveals an elderly gentleman in no acute distress.Pupils equal and reactive.   Extraocular Movements are full.  There is no scleral icterus.  The mouth and pharynx are normal.  The neck is supple.  The carotids reveal no bruits.  The jugular venous pressure is normal.  The thyroid is not enlarged.  There is no  lymphadenopathy.The chest is clear to percussion and auscultation. There are no rales or rhonchi. Expansion of the chest is symmetrical.  The heart reveals a soft systolic ejection murmur at the aortic area.  No diastolic murmur.  No gallop or rub.The abdomen is soft and nontender. Bowel sounds are normal. The liver and spleen are not enlarged. There Are no abdominal masses. There are no bruits.The pedal pulses are good.  There is no phlebitis or edema.  There is no cyanosis or clubbing.Strength is normal and symmetrical in all extremities.  There is no lateralizing weakness.  There are no sensory deficits.   Assessment / Plan: Continue present medication.  Recheck in 4 months for followup office visit lipid panel chemistries.

## 2010-09-24 NOTE — Assessment & Plan Note (Signed)
The patient has a past history of essential hypertension.  He has not been having any problems recently with his blood pressure.  He's having no dizziness or syncope.  No TIA symptoms.

## 2010-09-24 NOTE — Assessment & Plan Note (Signed)
Patient is status post aortic valve replacement and CABG.  He's not expressing any symptoms of congestive heart failure or angina pectoris.

## 2010-09-24 NOTE — Telephone Encounter (Signed)
Advised patient of lab

## 2010-09-28 ENCOUNTER — Other Ambulatory Visit: Payer: Self-pay | Admitting: *Deleted

## 2010-09-28 DIAGNOSIS — E7800 Pure hypercholesterolemia, unspecified: Secondary | ICD-10-CM

## 2010-11-20 ENCOUNTER — Other Ambulatory Visit: Payer: Self-pay | Admitting: *Deleted

## 2010-11-20 DIAGNOSIS — M1990 Unspecified osteoarthritis, unspecified site: Secondary | ICD-10-CM

## 2010-11-20 MED ORDER — ALCLOMETASONE DIPROPIONATE 0.05 % EX CREA: TOPICAL_CREAM | CUTANEOUS | Status: DC

## 2010-11-20 NOTE — Telephone Encounter (Signed)
Patient walked in requesting refill on cream.  Not on EMR med sheet, however in paper chart.  [facility]ed into [facility].  Also need refill to [facility] for hydrocodone.  Advised would be next week before  Dr. Patty Sermons in to sign.  Bottle patient brought in for vicodin 5/500, chart says 5/325

## 2010-11-23 ENCOUNTER — Other Ambulatory Visit: Payer: Self-pay | Admitting: *Deleted

## 2010-11-23 DIAGNOSIS — R52 Pain, unspecified: Secondary | ICD-10-CM

## 2010-11-23 MED ORDER — HYDROCODONE-ACETAMINOPHEN 5-500 MG PO TABS: 1.0000 | ORAL_TABLET | Freq: Three times a day (TID) | ORAL | Status: DC | PRN

## 2010-11-23 MED ORDER — HYDROCODONE-ACETAMINOPHEN 5-325 MG PO TABS: 1.0000 | ORAL_TABLET | Freq: Four times a day (QID) | ORAL | Status: DC | PRN

## 2010-12-08 ENCOUNTER — Other Ambulatory Visit: Payer: Self-pay | Admitting: Dermatology

## 2011-01-06 ENCOUNTER — Other Ambulatory Visit: Payer: Self-pay | Admitting: Dermatology

## 2011-01-14 ENCOUNTER — Telehealth: Payer: Self-pay | Admitting: Cardiology

## 2011-01-15 ENCOUNTER — Ambulatory Visit (INDEPENDENT_AMBULATORY_CARE_PROVIDER_SITE_OTHER): Payer: Medicare Other | Admitting: *Deleted

## 2011-01-15 DIAGNOSIS — Z9889 Other specified postprocedural states: Secondary | ICD-10-CM

## 2011-01-15 DIAGNOSIS — Z953 Presence of xenogenic heart valve: Secondary | ICD-10-CM

## 2011-01-15 DIAGNOSIS — M1990 Unspecified osteoarthritis, unspecified site: Secondary | ICD-10-CM

## 2011-01-15 DIAGNOSIS — Z951 Presence of aortocoronary bypass graft: Secondary | ICD-10-CM

## 2011-01-15 DIAGNOSIS — E7800 Pure hypercholesterolemia, unspecified: Secondary | ICD-10-CM

## 2011-01-15 DIAGNOSIS — I119 Hypertensive heart disease without heart failure: Secondary | ICD-10-CM

## 2011-01-15 LAB — BASIC METABOLIC PANEL
Calcium: 9.4 mg/dL (ref 8.4–10.5)
Chloride: 102 mEq/L (ref 96–112)
Creatinine, Ser: 1.2 mg/dL (ref 0.4–1.5)
GFR: 59.71 mL/min — ABNORMAL LOW (ref >60.00)

## 2011-01-15 LAB — CBC WITH DIFFERENTIAL/PLATELET
Basophils Relative: 0.3 % (ref 0.0–3.0)
Eosinophils Relative: 2.6 % (ref 0.0–5.0)
Lymphocytes Relative: 24.6 % (ref 12.0–46.0)
Monocytes Relative: 8 % (ref 3.0–12.0)
Neutrophils Relative %: 64.5 % (ref 43.0–77.0)
RBC: 4.24 Mil/uL (ref 4.22–5.81)
WBC: 8.2 10*3/uL (ref 4.5–10.5)

## 2011-01-15 LAB — HEPATIC FUNCTION PANEL
ALT: 13 U/L (ref 0–53)
Albumin: 4.2 g/dL (ref 3.5–5.2)
Alkaline Phosphatase: 91 U/L (ref 39–117)
Total Protein: 7.3 g/dL (ref 6.0–8.3)

## 2011-01-15 LAB — LIPID PANEL
Cholesterol: 168 mg/dL (ref 0–200)
LDL Cholesterol: 92 mg/dL (ref 0–99)
Triglycerides: 164 mg/dL — ABNORMAL HIGH (ref 0.0–149.0)

## 2011-01-21 ENCOUNTER — Other Ambulatory Visit: Payer: Medicare Other | Admitting: *Deleted

## 2011-01-25 ENCOUNTER — Ambulatory Visit (INDEPENDENT_AMBULATORY_CARE_PROVIDER_SITE_OTHER): Payer: Medicare Other | Admitting: Cardiology

## 2011-01-25 ENCOUNTER — Encounter: Payer: Self-pay | Admitting: Cardiology

## 2011-01-25 VITALS — BP 118/56 | HR 60 | Ht 70.0 in | Wt 205.0 lb

## 2011-01-25 DIAGNOSIS — R5381 Other malaise: Secondary | ICD-10-CM

## 2011-01-25 DIAGNOSIS — R52 Pain, unspecified: Secondary | ICD-10-CM

## 2011-01-25 DIAGNOSIS — Z953 Presence of xenogenic heart valve: Secondary | ICD-10-CM

## 2011-01-25 DIAGNOSIS — I119 Hypertensive heart disease without heart failure: Secondary | ICD-10-CM

## 2011-01-25 DIAGNOSIS — R5383 Other fatigue: Secondary | ICD-10-CM | POA: Insufficient documentation

## 2011-01-25 DIAGNOSIS — I359 Nonrheumatic aortic valve disorder, unspecified: Secondary | ICD-10-CM

## 2011-01-25 DIAGNOSIS — E7800 Pure hypercholesterolemia, unspecified: Secondary | ICD-10-CM

## 2011-01-25 MED ORDER — LOSARTAN POTASSIUM-HCTZ 100-25 MG PO TABS: 1.0000 | ORAL_TABLET | Freq: Every day | ORAL | Status: DC

## 2011-01-25 MED ORDER — HYDROCODONE-ACETAMINOPHEN 5-500 MG PO TABS: 1.0000 | ORAL_TABLET | Freq: Three times a day (TID) | ORAL | Status: AC | PRN

## 2011-01-25 NOTE — Assessment & Plan Note (Signed)
Patient is tolerating his current blood pressure medicines without any side effects

## 2011-01-25 NOTE — Assessment & Plan Note (Signed)
The patient is not having angina pectoris.  Is not having any symptoms of CHF.

## 2011-01-25 NOTE — Telephone Encounter (Signed)
Wanted to give info needed for refills, done in office encounter

## 2011-01-25 NOTE — Telephone Encounter (Signed)
Pt was here this morning . He wants to talk to you

## 2011-01-25 NOTE — Assessment & Plan Note (Signed)
Patient complains of lack of energy.  His heart rate is only 60.  We will cut back on his metoprolol to just 50 mg twice a day instead of 75 mg twice a day and see if he does better.  When he returns at his next visit we will also check a TSH

## 2011-01-25 NOTE — Patient Instructions (Signed)
Decrease your Lopressor (metoprolol) from 75 mg twice daily to 50 mg twice daily Your physician recommends that you schedule a follow-up appointment in: 4 months with fasting labs (lp/bmet/hfp/tsh)

## 2011-01-25 NOTE — Progress Notes (Signed)
Michael Blanchard Date of Birth:  Jan 05, 1926 [facility] [address]   [address] [place], [place]  [address] [phone]           Fax   [phone]  History of Present Illness: This pleasant 77 year old gentleman is seen for a scheduled followup office visit.  He has a history of ischemic heart disease and valvular heart disease.  He had coronary artery bypass graft surgery and aortic valve replacement with a tissue valve in 2001.  Dr. Lavinia Sharps is his surgeon.  The patient has done well since then with no recurrent angina.  His last nuclear stress test was in 2007 and showed no evidence of ischemia and his ejection fraction was 59%.  Echocardiogram was 05/02/08 showing normal left ventricular systolic function with impaired relaxation and normal tissue valve aortic valve replacement with no aortic insufficiency.  Current Outpatient Prescriptions  Medication Sig Dispense Refill  . ACAI PO Take by mouth. Taking 2 daily       . alclomethasone (ACLOVATE) 0.05 % cream As directed  15 g  4  . aspirin 81 MG EC tablet Take 81 mg by mouth daily.        Marland Kitchen CALCIUM PO Take by mouth daily.        Marland Kitchen CRANBERRY PO Take by mouth. Taking  2000 daily       . Docusate Calcium (STOOL SOFTENER PO) Take by mouth. As needed       . ezetimibe (ZETIA) 10 MG tablet Take 10 mg by mouth daily.        Marland Kitchen losartan-hydrochlorothiazide (HYZAAR) 100-25 MG per tablet Take 1 tablet by mouth daily.        . metoprolol (LOPRESSOR) 50 MG tablet Take 50 mg by mouth 2 (two) times daily.       . Multiple Vitamin (MULTIVITAMIN) tablet Take 1 tablet by mouth daily.        . nitroGLYCERIN (NITROSTAT) 0.4 MG SL tablet Place 0.4 mg under the tongue every 5 (five) minutes as needed.        . rosuvastatin (CRESTOR) 10 MG tablet Take 5 mg by mouth every other day.          Allergies  Allergen Reactions  . Gabapentin   . Lipitor (Atorvastatin Calcium)   . Niaspan (Niacin)     Patient Active Problem List    Diagnoses  . Benign hypertensive heart disease without heart failure  . Hx of CABG  . S/P aortic valve replacement with bioprosthetic valve  . Hypercholesterolemia  . Osteoarthritis    History  Smoking status  . Former Smoker  . Quit date: 09/13/1985  Smokeless tobacco  . Not on file    History  Alcohol Use No    Family History  Problem Relation Age of Onset  . Stroke Mother   . Aneurysm Mother   . Aneurysm Father   . Heart disease Brother     Review of Systems: Constitutional: no fever chills diaphoresis or fatigue or change in weight.  Head and neck: no hearing loss, no epistaxis, no photophobia or visual disturbance. Respiratory: No cough, shortness of breath or wheezing. Cardiovascular: No chest pain peripheral edema, palpitations. Gastrointestinal: No abdominal distention, no abdominal pain, no change in bowel habits hematochezia or melena. Genitourinary: No dysuria, no frequency, no urgency, no nocturia. Musculoskeletal:No arthralgias, no back pain, no gait disturbance or myalgias. Neurological: No dizziness, no headaches, no numbness, no seizures, no syncope, no weakness, no  tremors. Hematologic: No lymphadenopathy, no easy bruising. Psychiatric: No confusion, no hallucinations, no sleep disturbance.    Physical Exam: Filed Vitals:   01/25/11 0929  BP: 118/56  Pulse: 60   The patient appears to be in no distress.  Head and neck exam reveals that the pupils are equal and reactive.  The extraocular movements are full.  There is no scleral icterus.  Mouth and pharynx are benign.  No lymphadenopathy.  No carotid bruits.  The jugular venous pressure is normal.  Thyroid is not enlarged or tender.  Chest is clear to percussion and auscultation.  No rales or rhonchi.  Expansion of the chest is symmetrical.  Heart reveals no abnormal lift or heave.  First and second heart sounds are normal.  There is no  gallop rub or click.  There is a soft systolic murmur  across his prosthetic aortic valve.  No diastolic murmur.  The abdomen is soft and nontender.  Bowel sounds are normoactive.  There is no hepatosplenomegaly or mass.  There are no abdominal bruits.  Extremities reveal no phlebitis or edema.  Pedal pulses are good.  There is no cyanosis or clubbing.  Neurologic exam is normal strength and no lateralizing weakness.  No sensory deficits.  Integument reveals no rash   Assessment / Plan:  Continue same medication except for reduction and metoprolol dose.  Recheck 4 months for office visit fasting lab work and TSH

## 2011-01-25 NOTE — Assessment & Plan Note (Signed)
Patient has a history of hypercholesterolemia.  We reviewed his recent labs which are satisfactory on current therapy of Crestor and Zetia.  No significant side effects from his medication

## 2011-02-12 ENCOUNTER — Telehealth: Payer: Self-pay | Admitting: *Deleted

## 2011-02-12 DIAGNOSIS — H9209 Otalgia, unspecified ear: Secondary | ICD-10-CM

## 2011-02-12 MED ORDER — NEOMYCIN-POLYMYXIN-HC 1 % OT SOLN: OTIC | Status: DC

## 2011-02-12 NOTE — Telephone Encounter (Signed)
Patient came in for labs and spoke with  Dr. Patty Sermons,  requested refill for ear drops.  Ok per  Dr. Patty Sermons

## 2011-02-15 ENCOUNTER — Telehealth: Payer: Self-pay | Admitting: Cardiology

## 2011-02-15 DIAGNOSIS — H9209 Otalgia, unspecified ear: Secondary | ICD-10-CM

## 2011-02-15 MED ORDER — AZITHROMYCIN 250 MG PO TABS: ORAL_TABLET | ORAL | Status: AC

## 2011-02-15 NOTE — Telephone Encounter (Signed)
Okay to call in a Z-Pak

## 2011-02-15 NOTE — Telephone Encounter (Signed)
Productive cough with colored sputum and ear pain.  Getting ready to go out of town and would like a Zpak called in.

## 2011-02-15 NOTE — Telephone Encounter (Signed)
New Msg: Pt calling wanting to speak to nurse about getting c-pap? Pt c/o cough that he can't seem to get rid of. As well as [facility] that [facility]. Please return pt call to discuss further.

## 2011-02-15 NOTE — Telephone Encounter (Signed)
Advised patient

## 2011-03-17 ENCOUNTER — Other Ambulatory Visit: Payer: Self-pay | Admitting: Cardiology

## 2011-03-17 MED ORDER — METOPROLOL TARTRATE 50 MG PO TABS: 50.0000 mg | ORAL_TABLET | Freq: Two times a day (BID) | ORAL | Status: DC

## 2011-04-21 DIAGNOSIS — H02039 Senile entropion of unspecified eye, unspecified eyelid: Secondary | ICD-10-CM | POA: Diagnosis not present

## 2011-04-22 ENCOUNTER — Telehealth: Payer: Self-pay | Admitting: Cardiology

## 2011-04-22 ENCOUNTER — Telehealth: Payer: Self-pay | Admitting: Cardiovascular Disease

## 2011-04-22 NOTE — Telephone Encounter (Signed)
Michael Blanchard return call to triage because they need to know Is it ok for pt to come off asa for 5-10 days prior to procedure

## 2011-04-22 NOTE — Telephone Encounter (Signed)
See phone note from Dr. Laruth Bouchard office regarding holding of ASA.  I spoke with Michael Blanchard and told him we would be in touch with him after we heard from Dr. Patty Sermons

## 2011-04-22 NOTE — Telephone Encounter (Signed)
Okay to hold ASA 5-7 days before eye surgery

## 2011-04-22 NOTE — Telephone Encounter (Signed)
Spoke with Lyla Son at Dr. Laruth Bouchard office and told her Dr. Patty Sermons is out of office today and I will route to him to address when back in office. She requests call back as soon as addressed so she can contact pt. (see previous phone call from pt regarding procedure).

## 2011-04-22 NOTE — Telephone Encounter (Signed)
New Problem   Patient would like a return call regarding procedure he is scheduled for tomorrow, he can be reached at hm# .  Please respond ASAP surgery depends on meds

## 2011-04-22 NOTE — Telephone Encounter (Signed)
Spoke with pt. He reports his right lower eyelid/lash is turning in toward eye ball and Dr. Dione Booze would like to do surgery to repair this.  Pt states Dr. Dione Booze is asking about ASA and if OK to do surgery.  Pt not sure if clearance needed or if ASA needs to be held and would like [place] to contact Keri at Dr. Laruth Bouchard office to clarify.  Phone is [phone]. Left message at Dr. Laruth Bouchard office for Lorina Rabon to call us

## 2011-04-23 NOTE — Telephone Encounter (Signed)
Lynnell Grain and sent to Dr Laruth Bouchard office

## 2011-04-30 DIAGNOSIS — H02039 Senile entropion of unspecified eye, unspecified eyelid: Secondary | ICD-10-CM | POA: Diagnosis not present

## 2011-05-25 ENCOUNTER — Ambulatory Visit (INDEPENDENT_AMBULATORY_CARE_PROVIDER_SITE_OTHER): Payer: Medicare Other | Admitting: Cardiology

## 2011-05-25 ENCOUNTER — Encounter: Payer: Self-pay | Admitting: Cardiology

## 2011-05-25 ENCOUNTER — Other Ambulatory Visit: Payer: Medicare Other

## 2011-05-25 DIAGNOSIS — Z954 Presence of other heart-valve replacement: Secondary | ICD-10-CM | POA: Diagnosis not present

## 2011-05-25 DIAGNOSIS — Z951 Presence of aortocoronary bypass graft: Secondary | ICD-10-CM

## 2011-05-25 DIAGNOSIS — E7800 Pure hypercholesterolemia, unspecified: Secondary | ICD-10-CM | POA: Diagnosis not present

## 2011-05-25 DIAGNOSIS — M1990 Unspecified osteoarthritis, unspecified site: Secondary | ICD-10-CM

## 2011-05-25 DIAGNOSIS — R5381 Other malaise: Secondary | ICD-10-CM | POA: Diagnosis not present

## 2011-05-25 DIAGNOSIS — R5383 Other fatigue: Secondary | ICD-10-CM | POA: Diagnosis not present

## 2011-05-25 DIAGNOSIS — Z952 Presence of prosthetic heart valve: Secondary | ICD-10-CM

## 2011-05-25 DIAGNOSIS — I119 Hypertensive heart disease without heart failure: Secondary | ICD-10-CM

## 2011-05-25 LAB — HEPATIC FUNCTION PANEL
AST: 18 U/L (ref 0–37)
Alkaline Phosphatase: 83 U/L (ref 39–117)
Bilirubin, Direct: 0.1 mg/dL (ref 0.0–0.3)
Total Bilirubin: 0.3 mg/dL (ref 0.3–1.2)

## 2011-05-25 LAB — LIPID PANEL
HDL: 46.5 mg/dL (ref >39.00)
LDL Cholesterol: 102 mg/dL — ABNORMAL HIGH (ref 0–99)
Total CHOL/HDL Ratio: 4
VLDL: 33.2 mg/dL (ref 0.0–40.0)

## 2011-05-25 LAB — TSH: TSH: 1.28 u[IU]/mL (ref 0.35–5.50)

## 2011-05-25 LAB — BASIC METABOLIC PANEL
GFR: 64.48 mL/min (ref >60.00)
Glucose, Bld: 93 mg/dL (ref 70–99)
Potassium: 4.1 mEq/L (ref 3.5–5.1)
Sodium: 139 mEq/L (ref 135–145)

## 2011-05-25 MED ORDER — NITROGLYCERIN 0.4 MG SL SUBL: 0.4000 mg | SUBLINGUAL_TABLET | SUBLINGUAL | Status: DC | PRN

## 2011-05-25 MED ORDER — AMLODIPINE BESYLATE 2.5 MG PO TABS: 2.5000 mg | ORAL_TABLET | Freq: Every day | ORAL | Status: DC

## 2011-05-25 NOTE — Assessment & Plan Note (Signed)
The patient is on Crestor and ezetimibe 4 hypercholesterolemia.  We are checking blood work today.  The patient complains of lack of energy and we will also check a CBC and TSH

## 2011-05-25 NOTE — Progress Notes (Signed)
Quick Note:  Please report to patient. The recent labs are stable. Continue same medication and careful diet. ______ 

## 2011-05-25 NOTE — Assessment & Plan Note (Signed)
Patient has not been expressing any recurrent angina pectoris.

## 2011-05-25 NOTE — Patient Instructions (Signed)
Will obtain labs today and call you with the results (LP/BMET/HFP/TSH)  Add Amlodipine 2.5 mg daily to your medications for blood pressure   Your physician wants you to follow-up in: 4 MONTHS  You will receive a reminder letter in the mail two months in advance. If you don't receive a letter, please call our office to schedule the follow-up appointment.

## 2011-05-25 NOTE — Progress Notes (Signed)
Michael Blanchard Date of Birth:  09-18-1925 [facility] [address] [place], [place]  [address] [phone]         Fax   [phone]  History of Present Illness: This pleasant 78 year old gentleman is seen for a scheduled followup office visit.  He has a past history of coronary artery disease and valvular heart disease.  He had coronary artery bypass graft surgery and aortic valve replacement using a tissue valve in 2001.  The patient has done well since then.  His last nuclear stress test was in 2007 and showed no evidence of ischemia.  His ejection fraction was 59%.  His echocardiogram in February 2010 showed normal LV systolic function with impaired relaxation and normal tissue valve aortic valve replacement with no aortic valve insufficiency.  She has a history of hypercholesterolemia.  He has a history of essential hypertension.  Recently his blood pressures have been running higher.  Current Outpatient Prescriptions  Medication Sig Dispense Refill  . ACAI PO Take by mouth. Taking 2 daily       . alclomethasone (ACLOVATE) 0.05 % cream As directed  15 g  4  . aspirin 81 MG EC tablet Take 81 mg by mouth daily.        Marland Kitchen CALCIUM PO Take by mouth daily.        Marland Kitchen CRANBERRY PO Take by mouth. Taking  2000 daily       . Docusate Calcium (STOOL SOFTENER PO) Take by mouth. As needed       . ezetimibe (ZETIA) 10 MG tablet Take 10 mg by mouth daily.        Marland Kitchen HYDROcodone-acetaminophen (LORTAB) 10-500 MG per tablet Take 1 tablet by mouth every 6 (six) hours as needed.      Marland Kitchen losartan-hydrochlorothiazide (HYZAAR) 100-25 MG per tablet Take 1 tablet by mouth daily.  90 tablet  3  . metoprolol (LOPRESSOR) 50 MG tablet Take 1 tablet (50 mg total) by mouth 2 (two) times daily.  90 tablet  3  . Multiple Vitamin (MULTIVITAMIN) tablet Take 1 tablet by mouth daily.        . NEOMYCIN-POLYMYXIN-HC, OTIC, (CORTISPORIN) 1 % SOLN 2 drops in ear 3 times a day  10 mL  1  . nitroGLYCERIN  (NITROSTAT) 0.4 MG SL tablet Place 1 tablet (0.4 mg total) under the tongue every 5 (five) minutes as needed.  25 tablet  prn  . rosuvastatin (CRESTOR) 10 MG tablet Take 5 mg by mouth every other day.        Marland Kitchen amLODipine (NORVASC) 2.5 MG tablet Take 1 tablet (2.5 mg total) by mouth daily.  30 tablet  5    Allergies  Allergen Reactions  . Gabapentin   . Lipitor (Atorvastatin Calcium)   . Niaspan (Niacin)     Patient Active Problem List  Diagnoses  . Benign hypertensive heart disease without heart failure  . Hx of CABG  . S/P aortic valve replacement with bioprosthetic valve  . Hypercholesterolemia  . Osteoarthritis  . Malaise and fatigue    History  Smoking status  . Former Smoker  . Quit date: 09/13/1985  Smokeless tobacco  . Not on file    History  Alcohol Use No    Family History  Problem Relation Age of Onset  . Stroke Mother   . Aneurysm Mother   . Aneurysm Father   . Heart disease Brother     Review of Systems: Constitutional: no fever chills diaphoresis  or fatigue or change in weight.  Head and neck: no hearing loss, no epistaxis, no photophobia or visual disturbance. Respiratory: No cough, shortness of breath or wheezing. Cardiovascular: No chest pain peripheral edema, palpitations. Gastrointestinal: No abdominal distention, no abdominal pain, no change in bowel habits hematochezia or melena. Genitourinary: No dysuria, no frequency, no urgency, no nocturia. Musculoskeletal:No arthralgias, no back pain, no gait disturbance or myalgias. Neurological: No dizziness, no headaches, no numbness, no seizures, no syncope, no weakness, no tremors. Hematologic: No lymphadenopathy, no easy bruising. Psychiatric: No confusion, no hallucinations, no sleep disturbance.    Physical Exam: Filed Vitals:   05/25/11 0955  BP: 160/80  Pulse: 54   the general appearance reveals a well-developed well-nourished elderly gentleman in no distress.Pupils equal and reactive.    Extraocular Movements are full.  There is no scleral icterus.  The mouth and pharynx are normal.  The neck is supple.  The carotids reveal no bruits.  The jugular venous pressure is normal.  The thyroid is not enlarged.  There is no lymphadenopathy.  The chest is clear to percussion and auscultation. There are no rales or rhonchi. Expansion of the chest is symmetrical.  Heart reveals a soft systolic ejection murmur across the prosthetic aortic valve.  No diastolic murmur.The abdomen is soft and nontender. Bowel sounds are normal. The liver and spleen are not enlarged. There Are no abdominal masses. There are no bruits.  The pedal pulses are good.  There is no phlebitis or edema.  There is no cyanosis or clubbing. Strength is normal and symmetrical in all extremities.  There is no lateralizing weakness.  There are no sensory deficits.     Assessment / Plan: Continue same medication and add amlodipine 2.5 mg one daily.  Recheck in 4 months for followup office visit and fasting lab work

## 2011-05-25 NOTE — Assessment & Plan Note (Signed)
Patient continues to have a lot of problems with osteoarthritis of the knees which limits his ability to ambulate

## 2011-05-25 NOTE — Assessment & Plan Note (Signed)
Blood pressure has been running high on current therapy.  His pulse is not rapid.  We will add amlodipine 2.5 mg one daily.

## 2011-05-26 ENCOUNTER — Telehealth: Payer: Self-pay | Admitting: *Deleted

## 2011-05-26 NOTE — Telephone Encounter (Signed)
Message copied by Burnell Blanks on Wed May 26, 2011  9:17 AM ------      Message from: Cassell Clement      Created: Tue May 25, 2011  9:50 PM       Please report to patient.  The recent labs are stable. Continue same medication and careful diet.

## 2011-05-26 NOTE — Telephone Encounter (Signed)
Mailed copy of labs and left message to call if any questions  

## 2011-06-08 ENCOUNTER — Other Ambulatory Visit: Payer: Self-pay | Admitting: Dermatology

## 2011-06-08 ENCOUNTER — Other Ambulatory Visit: Payer: Self-pay | Admitting: Cardiology

## 2011-06-08 DIAGNOSIS — D239 Other benign neoplasm of skin, unspecified: Secondary | ICD-10-CM | POA: Diagnosis not present

## 2011-06-08 DIAGNOSIS — Z85828 Personal history of other malignant neoplasm of skin: Secondary | ICD-10-CM | POA: Diagnosis not present

## 2011-06-08 DIAGNOSIS — I119 Hypertensive heart disease without heart failure: Secondary | ICD-10-CM

## 2011-06-08 DIAGNOSIS — L570 Actinic keratosis: Secondary | ICD-10-CM | POA: Diagnosis not present

## 2011-06-08 MED ORDER — AMLODIPINE BESYLATE 2.5 MG PO TABS: 2.5000 mg | ORAL_TABLET | Freq: Every day | ORAL | Status: DC

## 2011-06-15 DIAGNOSIS — G500 Trigeminal neuralgia: Secondary | ICD-10-CM | POA: Diagnosis not present

## 2011-07-05 ENCOUNTER — Telehealth: Payer: Self-pay | Admitting: Cardiology

## 2011-07-05 NOTE — Telephone Encounter (Signed)
Had lunch and went to bedroom to change a light bulb.  He took the bulb out and when he went to put the new one in he became what he described as numb all over.  He stated he lost control of his body and could not move or do anything.  He said it didn't last long but has never had this happen to him before.  He didn't know if he should call  Dr. Patty Sermons or Dr Sandria Manly.  Advised him  Dr. Patty Sermons not in the office and to call Dr Sandria Manly.  If Dr Sandria Manly unable to see him he needs to go to urgent care to evaluated.  Verbalized understanding.

## 2011-07-05 NOTE — Telephone Encounter (Signed)
Agree with advice given

## 2011-07-05 NOTE — Telephone Encounter (Signed)
Pt calling some symptoms he is having, pls call

## 2011-07-06 DIAGNOSIS — G459 Transient cerebral ischemic attack, unspecified: Secondary | ICD-10-CM | POA: Diagnosis not present

## 2011-07-13 ENCOUNTER — Other Ambulatory Visit: Payer: Self-pay | Admitting: Neurology

## 2011-07-13 DIAGNOSIS — G459 Transient cerebral ischemic attack, unspecified: Secondary | ICD-10-CM

## 2011-07-20 ENCOUNTER — Ambulatory Visit
Admission: RE | Admit: 2011-07-20 | Discharge: 2011-07-20 | Disposition: A | Discharge status: Home / Self Care | Payer: Medicare Other | Source: Ambulatory Visit | Attending: Neurology | Admitting: Neurology

## 2011-07-20 DIAGNOSIS — G459 Transient cerebral ischemic attack, unspecified: Secondary | ICD-10-CM

## 2011-07-20 MED ORDER — GADOBENATE DIMEGLUMINE 529 MG/ML IV SOLN
18.0000 mL | Freq: Once | INTRAVENOUS | Status: AC | PRN
  Administered 2011-07-20: 18 mL via INTRAVENOUS

## 2011-07-27 ENCOUNTER — Other Ambulatory Visit: Payer: Self-pay | Admitting: Cardiology

## 2011-07-27 MED ORDER — EZETIMIBE 10 MG PO TABS: 10.0000 mg | ORAL_TABLET | Freq: Every day | ORAL | Status: DC

## 2011-08-02 ENCOUNTER — Other Ambulatory Visit: Payer: Self-pay | Admitting: Cardiology

## 2011-08-02 DIAGNOSIS — M1990 Unspecified osteoarthritis, unspecified site: Secondary | ICD-10-CM

## 2011-08-02 NOTE — Telephone Encounter (Signed)
Refill- HYDROcodone-acetaminophen (LORTAB) 10-500 MG per tablet   verified preferred [facility], patient can be reached at [phone], patient is running low

## 2011-08-02 NOTE — Telephone Encounter (Signed)
Refill on hydrocodone

## 2011-08-04 NOTE — Telephone Encounter (Signed)
Spoke with patient and advised Dr. Patty Sermons  Not in ,would check with him on his return  And call him.

## 2011-08-05 DIAGNOSIS — Z961 Presence of intraocular lens: Secondary | ICD-10-CM | POA: Diagnosis not present

## 2011-08-05 DIAGNOSIS — H02139 Senile ectropion of unspecified eye, unspecified eyelid: Secondary | ICD-10-CM | POA: Diagnosis not present

## 2011-08-05 DIAGNOSIS — H1130 Conjunctival hemorrhage, unspecified eye: Secondary | ICD-10-CM | POA: Diagnosis not present

## 2011-08-09 MED ORDER — HYDROCODONE-ACETAMINOPHEN 10-500 MG PO TABS: 1.0000 | ORAL_TABLET | Freq: Four times a day (QID) | ORAL | Status: DC | PRN

## 2011-08-10 DIAGNOSIS — Z961 Presence of intraocular lens: Secondary | ICD-10-CM | POA: Diagnosis not present

## 2011-08-10 DIAGNOSIS — H1130 Conjunctival hemorrhage, unspecified eye: Secondary | ICD-10-CM | POA: Diagnosis not present

## 2011-08-13 ENCOUNTER — Telehealth: Payer: Self-pay | Admitting: Cardiology

## 2011-08-13 NOTE — Telephone Encounter (Signed)
Refill f/u- HYDROcodone-acetaminophen (LORTAB) 10-500 MG per tablet   Patient said he is waiting on this RX, Verified [facility] as preferred.  Patient checked with [facility] today and they have not received a prescription for this RX since last November 2012.  Please research and advise patient, [phone].      Med Notes state 08/09/11 refill .

## 2011-08-17 NOTE — Telephone Encounter (Signed)
Med was not received by [facility], called script in for lortab as rx states

## 2011-08-17 NOTE — Telephone Encounter (Signed)
Fu call Pt called back again for this refill he hasnt heard anything. He is out of pills.

## 2011-08-17 NOTE — Telephone Encounter (Signed)
Called pt and informed it appears that the med was filled 5/13, i will call [facility] and inquire on medication, pt agreed to plan.

## 2011-08-18 DIAGNOSIS — G459 Transient cerebral ischemic attack, unspecified: Secondary | ICD-10-CM | POA: Diagnosis not present

## 2011-08-31 ENCOUNTER — Telehealth: Payer: Self-pay | Admitting: Cardiology

## 2011-08-31 DIAGNOSIS — M1990 Unspecified osteoarthritis, unspecified site: Secondary | ICD-10-CM

## 2011-08-31 MED ORDER — HYDROCODONE-ACETAMINOPHEN 10-500 MG PO TABS: 1.0000 | ORAL_TABLET | Freq: Four times a day (QID) | ORAL | Status: DC | PRN

## 2011-08-31 NOTE — Telephone Encounter (Signed)
3 months supply ok by Dr. Domenica Fail of 300 with 1 refill

## 2011-09-21 ENCOUNTER — Other Ambulatory Visit: Payer: Medicare Other

## 2011-09-22 ENCOUNTER — Encounter: Payer: Self-pay | Admitting: Cardiology

## 2011-09-22 ENCOUNTER — Ambulatory Visit (INDEPENDENT_AMBULATORY_CARE_PROVIDER_SITE_OTHER): Payer: Medicare Other | Admitting: Cardiology

## 2011-09-22 ENCOUNTER — Other Ambulatory Visit (INDEPENDENT_AMBULATORY_CARE_PROVIDER_SITE_OTHER): Payer: Medicare Other

## 2011-09-22 VITALS — BP 128/70 | HR 78 | Ht 70.0 in | Wt 189.0 lb

## 2011-09-22 DIAGNOSIS — I2510 Atherosclerotic heart disease of native coronary artery without angina pectoris: Secondary | ICD-10-CM | POA: Diagnosis not present

## 2011-09-22 DIAGNOSIS — I119 Hypertensive heart disease without heart failure: Secondary | ICD-10-CM | POA: Diagnosis not present

## 2011-09-22 DIAGNOSIS — E7800 Pure hypercholesterolemia, unspecified: Secondary | ICD-10-CM | POA: Diagnosis not present

## 2011-09-22 DIAGNOSIS — M1990 Unspecified osteoarthritis, unspecified site: Secondary | ICD-10-CM | POA: Diagnosis not present

## 2011-09-22 LAB — LIPID PANEL
HDL: 44 mg/dL (ref >39.00)
LDL Cholesterol: 76 mg/dL (ref 0–99)
Total CHOL/HDL Ratio: 3
Triglycerides: 113 mg/dL (ref 0.0–149.0)
VLDL: 22.6 mg/dL (ref 0.0–40.0)

## 2011-09-22 LAB — HEPATIC FUNCTION PANEL
Alkaline Phosphatase: 72 U/L (ref 39–117)
Bilirubin, Direct: 0.1 mg/dL (ref 0.0–0.3)
Total Bilirubin: 0.8 mg/dL (ref 0.3–1.2)
Total Protein: 7.6 g/dL (ref 6.0–8.3)

## 2011-09-22 LAB — BASIC METABOLIC PANEL
CO2: 27 mEq/L (ref 19–32)
Calcium: 9.9 mg/dL (ref 8.4–10.5)
GFR: 54.95 mL/min — ABNORMAL LOW (ref >60.00)
Sodium: 140 mEq/L (ref 135–145)

## 2011-09-22 NOTE — Assessment & Plan Note (Signed)
The patient has severe osteoarthritis involving especially his knees.  Because of his wife's blindness he has had to do all the housework and cooking etc. and he stays tired

## 2011-09-22 NOTE — Progress Notes (Signed)
Michael Blanchard Date of Birth:  [birth year] [facility] [address] [place], [place]  [address] [phone]         Fax   [phone]  History of Present Illness: This pleasant 78 year old gentleman is seen for a scheduled two-month followup office visit.  He has a history of ischemic heart disease and valvular heart disease.  He had coronary artery bypass graft surgery as well as aortic valve replacement using a tissue valve in 2001.  He has done well postoperatively.  He had a nuclear stress test in 2007 showing no evidence of ischemia.  His ejection fraction was 59%.  He had an echocardiogram in February 2010 showing normal LV systolic function with impaired relaxation and showed normal tissue aortic valve replacement with no aortic insufficiency.  He has a history of high blood pressure and history of hypercholesterolemia.  Current Outpatient Prescriptions  Medication Sig Dispense Refill  . ACAI PO Take by mouth. Taking 2 daily       . amLODipine (NORVASC) 2.5 MG tablet Take 1 tablet (2.5 mg total) by mouth daily.  90 tablet  3  . CALCIUM PO Take by mouth daily.        . carbamazepine (TEGRETOL) 200 MG tablet Take 200 mg by mouth Daily.      . clopidogrel (PLAVIX) 75 MG tablet Take 75 mg by mouth Daily.      Marland Kitchen CRANBERRY PO Take by mouth. Taking  2000 daily       . Docusate Calcium (STOOL SOFTENER PO) Take by mouth. As needed       . ezetimibe (ZETIA) 10 MG tablet Take 1 tablet (10 mg total) by mouth daily.  90 tablet  3  . HYDROcodone-acetaminophen (LORTAB) 10-500 MG per tablet Take 1 tablet by mouth every 6 (six) hours as needed.  300 tablet  1  . losartan-hydrochlorothiazide (HYZAAR) 100-25 MG per tablet Take 1 tablet by mouth daily.  90 tablet  3  . metoprolol (LOPRESSOR) 50 MG tablet Take 1 tablet (50 mg total) by mouth 2 (two) times daily.  90 tablet  3  . Multiple Vitamin (MULTIVITAMIN) tablet Take 1 tablet by mouth daily.        . nitroGLYCERIN (NITROSTAT)  0.4 MG SL tablet Place 1 tablet (0.4 mg total) under the tongue every 5 (five) minutes as needed.  25 tablet  prn  . rosuvastatin (CRESTOR) 10 MG tablet Take 5 mg by mouth every other day.          Allergies  Allergen Reactions  . Gabapentin   . Lipitor (Atorvastatin Calcium)   . Niaspan (Niacin)     Patient Active Problem List  Diagnosis  . Benign hypertensive heart disease without heart failure  . Hx of CABG  . S/P aortic valve replacement with bioprosthetic valve  . Hypercholesterolemia  . Osteoarthritis  . Malaise and fatigue    History  Smoking status  . Former Smoker  . Quit date: 09/13/1985  Smokeless tobacco  . Not on file    History  Alcohol Use No    Family History  Problem Relation Age of Onset  . Stroke Mother   . Aneurysm Mother   . Aneurysm Father   . Heart disease Brother     Review of Systems: Constitutional: no fever chills diaphoresis or fatigue or change in weight.  Head and neck: no hearing loss, no epistaxis, no photophobia or visual disturbance. Respiratory: No cough, shortness of breath or wheezing.  Cardiovascular: No chest pain peripheral edema, palpitations. Gastrointestinal: No abdominal distention, no abdominal pain, no change in bowel habits hematochezia or melena. Genitourinary: No dysuria, no frequency, no urgency, no nocturia. Musculoskeletal:No arthralgias, no back pain, no gait disturbance or myalgias. Neurological: No dizziness, no headaches, no numbness, no seizures, no syncope, no weakness, no tremors. Hematologic: No lymphadenopathy, no easy bruising. Psychiatric: No confusion, no hallucinations, no sleep disturbance.    Physical Exam: Filed Vitals:   09/22/11 0934  BP: 128/70  Pulse: 78   The general appearance reveals an elderly gentleman in no acute distress.  The head and neck exam reveals pupils equal and reactive.  Extraocular movements are full.  There is no scleral icterus.  The mouth and pharynx are normal.   The neck is supple.  The carotids reveal no bruits.  The jugular venous pressure is normal.  The  thyroid is not enlarged.  There is no lymphadenopathy.  The chest is clear to percussion and auscultation.  There are no rales or rhonchi.  Expansion of the chest is symmetrical.  The precordium is quiet.  The first heart sound is normal.  The second heart sound is physiologically split.  There is soft systolic ejection murmur at the base across his prosthetic aortic valve.  There is no abnormal lift or heave.  The abdomen is soft and nontender.  The bowel sounds are normal.  The liver and spleen are not enlarged.  There are no abdominal masses.  There are no abdominal bruits.  Extremities reveal good pedal pulses.  There is no phlebitis or edema.  There is no cyanosis or clubbing.  Strength is normal and symmetrical in all extremities.  There is no lateralizing weakness.  There are no sensory deficits.  The skin is warm and dry.  There is no rash.   Assessment / Plan: The patient is to continue same medication.  He has lost 12 pounds intentionally since last visit.  I have encouraged him to continue to try to walk each day for exercise and also stays busy with housework.  Recheck in 4 months for followup office visit and fasting lab work

## 2011-09-22 NOTE — Patient Instructions (Signed)
Your physician recommends that you continue on your current medications as directed. Please refer to the Current Medication list given to you today.  Your physician recommends that you schedule a follow-up appointment in: 4 months with fasting labs (lp/bmet/hfp)  

## 2011-09-22 NOTE — Assessment & Plan Note (Signed)
Patient has a history of hypercholesterolemia and is on Crestor and ezetimibe.  Cholesterol is being checked today.  He has significant osteoarthritis but is not having any myalgias from the statin therapy

## 2011-09-22 NOTE — Assessment & Plan Note (Signed)
His last visit his systolic blood pressures were running high and we added amlodipine 2.5 mg one daily which she has tolerated well.  His blood pressures have come down to normal.  He has not had any ankle edema problems

## 2011-09-23 ENCOUNTER — Ambulatory Visit: Payer: Medicare Other | Admitting: Cardiology

## 2011-09-23 NOTE — Progress Notes (Signed)
Quick Note:  Please report to patient. The recent labs are stable. Continue same medication and careful diet. ______ 

## 2011-09-28 ENCOUNTER — Telehealth: Payer: Self-pay | Admitting: *Deleted

## 2011-09-28 NOTE — Telephone Encounter (Signed)
Message copied by Burnell Blanks on Tue Sep 28, 2011  9:17 AM ------      Message from: Cassell Clement      Created: Thu Sep 23, 2011  1:03 PM       Please report to patient.  The recent labs are stable. Continue same medication and careful diet.

## 2011-09-28 NOTE — Telephone Encounter (Signed)
Advised of labs 

## 2011-10-04 ENCOUNTER — Telehealth: Payer: Self-pay | Admitting: Cardiology

## 2011-10-04 NOTE — Telephone Encounter (Signed)
Pt calling re a test he had last week

## 2011-10-04 NOTE — Telephone Encounter (Signed)
Called patient, actually calling about his wife.  Advised wife of urinalysis

## 2011-10-21 ENCOUNTER — Other Ambulatory Visit: Payer: Self-pay | Admitting: *Deleted

## 2011-10-21 MED ORDER — METOPROLOL TARTRATE 50 MG PO TABS: 50.0000 mg | ORAL_TABLET | Freq: Two times a day (BID) | ORAL | Status: DC

## 2011-10-21 NOTE — Telephone Encounter (Signed)
Refilled lopressor

## 2011-10-27 ENCOUNTER — Other Ambulatory Visit: Payer: Self-pay | Admitting: *Deleted

## 2011-10-27 MED ORDER — METOPROLOL TARTRATE 50 MG PO TABS: 50.0000 mg | ORAL_TABLET | Freq: Two times a day (BID) | ORAL | Status: DC

## 2011-10-27 NOTE — Telephone Encounter (Signed)
Refilled lopressor

## 2011-11-03 DIAGNOSIS — I2510 Atherosclerotic heart disease of native coronary artery without angina pectoris: Secondary | ICD-10-CM | POA: Diagnosis not present

## 2011-11-03 DIAGNOSIS — E7800 Pure hypercholesterolemia, unspecified: Secondary | ICD-10-CM | POA: Diagnosis not present

## 2011-11-03 DIAGNOSIS — I10 Essential (primary) hypertension: Secondary | ICD-10-CM | POA: Diagnosis not present

## 2011-11-03 DIAGNOSIS — G500 Trigeminal neuralgia: Secondary | ICD-10-CM | POA: Diagnosis not present

## 2011-11-18 ENCOUNTER — Other Ambulatory Visit: Payer: Self-pay | Admitting: Cardiology

## 2011-11-18 DIAGNOSIS — L309 Dermatitis, unspecified: Secondary | ICD-10-CM

## 2011-12-06 DIAGNOSIS — E7800 Pure hypercholesterolemia, unspecified: Secondary | ICD-10-CM | POA: Diagnosis not present

## 2011-12-06 DIAGNOSIS — Z23 Encounter for immunization: Secondary | ICD-10-CM | POA: Diagnosis not present

## 2011-12-06 DIAGNOSIS — I10 Essential (primary) hypertension: Secondary | ICD-10-CM | POA: Diagnosis not present

## 2011-12-21 ENCOUNTER — Other Ambulatory Visit: Payer: Self-pay | Admitting: *Deleted

## 2011-12-21 DIAGNOSIS — I119 Hypertensive heart disease without heart failure: Secondary | ICD-10-CM

## 2011-12-21 MED ORDER — LOSARTAN POTASSIUM-HCTZ 100-25 MG PO TABS: 1.0000 | ORAL_TABLET | Freq: Every day | ORAL | Status: DC

## 2012-01-19 DIAGNOSIS — H02039 Senile entropion of unspecified eye, unspecified eyelid: Secondary | ICD-10-CM | POA: Diagnosis not present

## 2012-01-19 DIAGNOSIS — Z961 Presence of intraocular lens: Secondary | ICD-10-CM | POA: Diagnosis not present

## 2012-01-25 ENCOUNTER — Other Ambulatory Visit (INDEPENDENT_AMBULATORY_CARE_PROVIDER_SITE_OTHER): Payer: Medicare Other

## 2012-01-25 ENCOUNTER — Ambulatory Visit (INDEPENDENT_AMBULATORY_CARE_PROVIDER_SITE_OTHER): Payer: Medicare Other | Admitting: Cardiology

## 2012-01-25 ENCOUNTER — Telehealth: Payer: Self-pay | Admitting: *Deleted

## 2012-01-25 VITALS — BP 156/57 | HR 52 | Wt 183.0 lb

## 2012-01-25 DIAGNOSIS — Z952 Presence of prosthetic heart valve: Secondary | ICD-10-CM

## 2012-01-25 DIAGNOSIS — Z951 Presence of aortocoronary bypass graft: Secondary | ICD-10-CM

## 2012-01-25 DIAGNOSIS — E7800 Pure hypercholesterolemia, unspecified: Secondary | ICD-10-CM | POA: Diagnosis not present

## 2012-01-25 DIAGNOSIS — I119 Hypertensive heart disease without heart failure: Secondary | ICD-10-CM | POA: Diagnosis not present

## 2012-01-25 DIAGNOSIS — Z954 Presence of other heart-valve replacement: Secondary | ICD-10-CM

## 2012-01-25 DIAGNOSIS — R0989 Other specified symptoms and signs involving the circulatory and respiratory systems: Secondary | ICD-10-CM

## 2012-01-25 DIAGNOSIS — Z953 Presence of xenogenic heart valve: Secondary | ICD-10-CM

## 2012-01-25 LAB — BASIC METABOLIC PANEL WITH GFR
BUN: 20 mg/dL (ref 6–23)
CO2: 30 meq/L (ref 19–32)
Calcium: 9.4 mg/dL (ref 8.4–10.5)
Chloride: 101 meq/L (ref 96–112)
Creatinine, Ser: 1.2 mg/dL (ref 0.4–1.5)
GFR: 59.01 mL/min — ABNORMAL LOW
Glucose, Bld: 87 mg/dL (ref 70–99)
Potassium: 4.1 meq/L (ref 3.5–5.1)
Sodium: 138 meq/L (ref 135–145)

## 2012-01-25 LAB — HEPATIC FUNCTION PANEL
ALT: 10 U/L (ref 0–53)
AST: 17 U/L (ref 0–37)
Albumin: 4 g/dL (ref 3.5–5.2)
Alkaline Phosphatase: 68 U/L (ref 39–117)
Bilirubin, Direct: 0.1 mg/dL (ref 0.0–0.3)
Total Bilirubin: 0.9 mg/dL (ref 0.3–1.2)
Total Protein: 7.1 g/dL (ref 6.0–8.3)

## 2012-01-25 LAB — LIPID PANEL: Cholesterol: 127 mg/dL (ref 0–200)

## 2012-01-25 NOTE — Patient Instructions (Signed)
Will obtain labs today and call you with the results (lp/bmet/hfp)  Your physician recommends that you continue on your current medications as directed. Please refer to the Current Medication list given to you today.  Your physician recommends that you schedule a follow-up appointment in: 4 months with fasting labs (lp/bmet/hfp)  

## 2012-01-25 NOTE — Assessment & Plan Note (Signed)
Blood work was drawn today.  The patient is not having any symptoms from his Crestor in terms of myalgias

## 2012-01-25 NOTE — Progress Notes (Signed)
Michael Blanchard Date of Birth:  12-Jul-1925 [facility] [address] [place], [place]  [address] [phone]         Fax   [phone]  History of Present Illness: This pleasant 78 year old gentleman is seen for a scheduled  followup office visit. He has a history of ischemic heart disease and valvular heart disease. He had coronary artery bypass graft surgery as well as aortic valve replacement using a tissue valve in 2001. He has done well postoperatively. He had a nuclear stress test in 2007 showing no evidence of ischemia. His ejection fraction was 59%. He had an echocardiogram in February 2010 showing normal LV systolic function with impaired relaxation and showed normal tissue aortic valve replacement with no aortic insufficiency. He has a history of high blood pressure and history of hypercholesterolemia.  Since last visit he has been doing well except for chronic arthritis of his knees.  His primary care provider is Dr. Nehemiah Settle.  Current Outpatient Prescriptions  Medication Sig Dispense Refill  . ACAI PO Take by mouth. Taking 2 daily       . alclomethasone (ACLOVATE) 0.05 % cream USE AS DIRECTED  15 g  4  . amLODipine (NORVASC) 2.5 MG tablet Take 1 tablet (2.5 mg total) by mouth daily.  90 tablet  3  . CALCIUM PO Take by mouth daily.        . carbamazepine (TEGRETOL) 200 MG tablet Take 200 mg by mouth Daily.      . clopidogrel (PLAVIX) 75 MG tablet Take 75 mg by mouth Daily.      Marland Kitchen CRANBERRY PO Take by mouth. Taking  2000 daily       . Docusate Calcium (STOOL SOFTENER PO) Take by mouth. As needed       . ezetimibe (ZETIA) 10 MG tablet Take 1 tablet (10 mg total) by mouth daily.  90 tablet  3  . HYDROcodone-acetaminophen (LORTAB) 10-500 MG per tablet Take 1 tablet by mouth every 6 (six) hours as needed.  300 tablet  1  . losartan-hydrochlorothiazide (HYZAAR) 100-25 MG per tablet Take 1 tablet by mouth daily.  90 tablet  3  . metoprolol (LOPRESSOR) 50 MG tablet  Take 1 tablet (50 mg total) by mouth 2 (two) times daily.  180 tablet  3  . Multiple Vitamin (MULTIVITAMIN) tablet Take 1 tablet by mouth daily.        . nitroGLYCERIN (NITROSTAT) 0.4 MG SL tablet Place 1 tablet (0.4 mg total) under the tongue every 5 (five) minutes as needed.  25 tablet  prn  . rosuvastatin (CRESTOR) 10 MG tablet Take 5 mg by mouth every other day.          Allergies  Allergen Reactions  . Gabapentin   . Lipitor (Atorvastatin Calcium)   . Niaspan (Niacin)     Patient Active Problem List  Diagnosis  . Benign hypertensive heart disease without heart failure  . Hx of CABG  . S/P aortic valve replacement with bioprosthetic valve  . Hypercholesterolemia  . Osteoarthritis  . Malaise and fatigue    History  Smoking status  . Former Smoker  . Quit date: 09/13/1985  Smokeless tobacco  . Not on file    History  Alcohol Use No    Family History  Problem Relation Age of Onset  . Stroke Mother   . Aneurysm Mother   . Aneurysm Father   . Heart disease Brother     Review of Systems: Constitutional:  no fever chills diaphoresis or fatigue or change in weight.  Head and neck: no hearing loss, no epistaxis, no photophobia or visual disturbance. Respiratory: No cough, shortness of breath or wheezing. Cardiovascular: No chest pain peripheral edema, palpitations. Gastrointestinal: No abdominal distention, no abdominal pain, no change in bowel habits hematochezia or melena. Genitourinary: No dysuria, no frequency, no urgency, no nocturia. Musculoskeletal:No arthralgias, no back pain, no gait disturbance or myalgias. Neurological: No dizziness, no headaches, no numbness, no seizures, no syncope, no weakness, no tremors. Hematologic: No lymphadenopathy, no easy bruising. Psychiatric: No confusion, no hallucinations, no sleep disturbance.    Physical Exam: Filed Vitals:   01/25/12 1023  BP: 156/57  Pulse: 52   the general appearance reveals a well-developed  elderly gentleman in no distress.The head and neck exam reveals pupils equal and reactive.  Extraocular movements are full.  There is no scleral icterus.  The mouth and pharynx are normal.  The neck is supple.  The carotids reveal no bruits.  The jugular venous pressure is normal.  The  thyroid is not enlarged.  There is no lymphadenopathy.  The chest is clear to percussion and auscultation.  There are no rales or rhonchi.  Expansion of the chest is symmetrical.  The precordium is quiet.  The first heart sound is normal.  The second heart sound is physiologically split.  There is a grade 2/6 systolic ejection murmur at the aortic area across the prosthetic valve.  There is no diastolic murmur.  There is no abnormal lift or heave.  The abdomen is soft and nontender.  The bowel sounds are normal.  The liver and spleen are not enlarged.  There are no abdominal masses.  There are no abdominal bruits.  Extremities reveal good pedal pulses.  There is no phlebitis or edema.  There is no cyanosis or clubbing.  Strength is normal and symmetrical in all extremities.  There is no lateralizing weakness.  There are no sensory deficits.  The skin is warm and dry.  There is no rash.    Assessment / Plan: Continue same medication.  Lab work drawn today.  Recheck in 4 months for office visit lipid panel hepatic function panel and basal metabolic panel

## 2012-01-25 NOTE — Assessment & Plan Note (Signed)
No dizziness or syncope.  No symptoms of congestive heart failure.  No angina.

## 2012-01-25 NOTE — Assessment & Plan Note (Signed)
Energy level is satisfactory.  The patient is having to do all of the housework and cooking because his wife is a semi-invalid because of her blindness.

## 2012-01-25 NOTE — Telephone Encounter (Signed)
Advised patient of lab results  

## 2012-01-25 NOTE — Telephone Encounter (Signed)
Message copied by Burnell Blanks on Tue Jan 25, 2012  5:02 PM ------      Message from: Cassell Clement      Created: Tue Jan 25, 2012  4:49 PM       Please report to patient.  The recent labs are stable. Continue same medication and careful diet.

## 2012-01-25 NOTE — Assessment & Plan Note (Signed)
The patient has not been experiencing any chest pain or angina. 

## 2012-01-25 NOTE — Progress Notes (Signed)
Quick Note:  Please report to patient. The recent labs are stable. Continue same medication and careful diet. ______ 

## 2012-02-15 DIAGNOSIS — I10 Essential (primary) hypertension: Secondary | ICD-10-CM | POA: Diagnosis not present

## 2012-02-15 DIAGNOSIS — E7800 Pure hypercholesterolemia, unspecified: Secondary | ICD-10-CM | POA: Diagnosis not present

## 2012-02-15 DIAGNOSIS — M25569 Pain in unspecified knee: Secondary | ICD-10-CM | POA: Diagnosis not present

## 2012-02-28 DIAGNOSIS — M25569 Pain in unspecified knee: Secondary | ICD-10-CM | POA: Diagnosis not present

## 2012-02-28 DIAGNOSIS — M1710 Unilateral primary osteoarthritis, unspecified knee: Secondary | ICD-10-CM | POA: Diagnosis not present

## 2012-03-16 DIAGNOSIS — M25569 Pain in unspecified knee: Secondary | ICD-10-CM | POA: Diagnosis not present

## 2012-03-16 DIAGNOSIS — R413 Other amnesia: Secondary | ICD-10-CM | POA: Diagnosis not present

## 2012-03-16 DIAGNOSIS — G459 Transient cerebral ischemic attack, unspecified: Secondary | ICD-10-CM | POA: Diagnosis not present

## 2012-03-28 ENCOUNTER — Other Ambulatory Visit: Payer: Self-pay | Admitting: *Deleted

## 2012-03-28 DIAGNOSIS — I119 Hypertensive heart disease without heart failure: Secondary | ICD-10-CM

## 2012-03-28 MED ORDER — AMLODIPINE BESYLATE 2.5 MG PO TABS: 2.5000 mg | ORAL_TABLET | Freq: Every day | ORAL | Status: DC

## 2012-05-08 ENCOUNTER — Other Ambulatory Visit: Payer: Self-pay | Admitting: *Deleted

## 2012-05-08 MED ORDER — EZETIMIBE 10 MG PO TABS: 10.0000 mg | ORAL_TABLET | Freq: Every day | ORAL | Status: DC

## 2012-05-11 DIAGNOSIS — I10 Essential (primary) hypertension: Secondary | ICD-10-CM | POA: Diagnosis not present

## 2012-05-22 ENCOUNTER — Ambulatory Visit (INDEPENDENT_AMBULATORY_CARE_PROVIDER_SITE_OTHER): Payer: Medicare Other | Admitting: *Deleted

## 2012-05-22 DIAGNOSIS — I119 Hypertensive heart disease without heart failure: Secondary | ICD-10-CM

## 2012-05-22 DIAGNOSIS — Z953 Presence of xenogenic heart valve: Secondary | ICD-10-CM

## 2012-05-22 DIAGNOSIS — E7800 Pure hypercholesterolemia, unspecified: Secondary | ICD-10-CM

## 2012-05-22 DIAGNOSIS — Z952 Presence of prosthetic heart valve: Secondary | ICD-10-CM | POA: Diagnosis not present

## 2012-05-22 LAB — LIPID PANEL
Cholesterol: 159 mg/dL (ref 0–200)
LDL Cholesterol: 89 mg/dL (ref 0–99)
Total CHOL/HDL Ratio: 4
Triglycerides: 131 mg/dL (ref 0.0–149.0)
VLDL: 26.2 mg/dL (ref 0.0–40.0)

## 2012-05-22 LAB — CBC WITH DIFFERENTIAL/PLATELET
Basophils Relative: 0.3 % (ref 0.0–3.0)
Eosinophils Relative: 1.3 % (ref 0.0–5.0)
HCT: 39.6 % (ref 39.0–52.0)
Lymphs Abs: 2 10*3/uL (ref 0.7–4.0)
MCV: 96.5 fl (ref 78.0–100.0)
Monocytes Absolute: 0.5 10*3/uL (ref 0.1–1.0)
Monocytes Relative: 6.6 % (ref 3.0–12.0)
Neutrophils Relative %: 67.3 % (ref 43.0–77.0)
Platelets: 214 10*3/uL (ref 150.0–400.0)
RBC: 4.11 Mil/uL — ABNORMAL LOW (ref 4.22–5.81)
WBC: 8.3 10*3/uL (ref 4.5–10.5)

## 2012-05-22 LAB — BASIC METABOLIC PANEL
BUN: 23 mg/dL (ref 6–23)
Calcium: 9.6 mg/dL (ref 8.4–10.5)
Creatinine, Ser: 1.2 mg/dL (ref 0.4–1.5)
GFR: 60.66 mL/min (ref >60.00)

## 2012-05-22 LAB — HEPATIC FUNCTION PANEL
AST: 16 U/L (ref 0–37)
Albumin: 4.2 g/dL (ref 3.5–5.2)
Total Bilirubin: 0.8 mg/dL (ref 0.3–1.2)

## 2012-05-22 NOTE — Progress Notes (Signed)
Quick Note:  Please make copy of labs for patient visit. ______ 

## 2012-05-23 ENCOUNTER — Encounter: Payer: Self-pay | Admitting: Cardiology

## 2012-05-23 ENCOUNTER — Ambulatory Visit (INDEPENDENT_AMBULATORY_CARE_PROVIDER_SITE_OTHER): Payer: Medicare Other | Admitting: Cardiology

## 2012-05-23 VITALS — BP 116/60 | HR 59 | Ht 70.0 in | Wt 180.8 lb

## 2012-05-23 DIAGNOSIS — I119 Hypertensive heart disease without heart failure: Secondary | ICD-10-CM

## 2012-05-23 DIAGNOSIS — E7800 Pure hypercholesterolemia, unspecified: Secondary | ICD-10-CM

## 2012-05-23 DIAGNOSIS — Z951 Presence of aortocoronary bypass graft: Secondary | ICD-10-CM

## 2012-05-23 MED ORDER — NITROGLYCERIN 0.4 MG SL SUBL: 0.4000 mg | SUBLINGUAL_TABLET | SUBLINGUAL | Status: DC | PRN

## 2012-05-23 NOTE — Assessment & Plan Note (Signed)
The patient has had no recurrent chest pain or angina.  He carries sublingual nitroglycerin in his pocket but has not had to use them.  We gave him a new prescription today because his old ones are outdated.

## 2012-05-23 NOTE — Patient Instructions (Signed)
Your physician recommends that you continue on your current medications as directed. Please refer to the Current Medication list given to you today.  Your physician wants you to follow-up in: 4 months with fasting labs (lp/bmet/hfp) You will receive a reminder letter in the mail two months in advance. If you don't receive a letter, please call our office to schedule the follow-up appointment.  

## 2012-05-23 NOTE — Progress Notes (Signed)
Michael Blanchard Date of Birth:  07-08-1925 [address] [place], [place]  [address] [phone]         Fax   [phone]  History of Present Illness: This pleasant 79 year old gentleman is seen for a scheduled followup office visit. He has a history of ischemic heart disease and valvular heart disease. He had coronary artery bypass graft surgery as well as aortic valve replacement using a tissue valve in 2001. He has done well postoperatively. He had a nuclear stress test in 2007 showing no evidence of ischemia. His ejection fraction was 59%. He had an echocardiogram in February 2010 showing normal LV systolic function with impaired relaxation and showed normal tissue aortic valve replacement with no aortic insufficiency. He has a history of high blood pressure and history of hypercholesterolemia. Since last visit he has been doing well except for chronic arthritis of his knees. His primary care provider is Dr. Nehemiah Settle.   Current Outpatient Prescriptions  Medication Sig Dispense Refill  . ACAI PO Take by mouth. Taking 2 daily       . alclomethasone (ACLOVATE) 0.05 % cream USE AS DIRECTED  15 g  4  . amLODipine (NORVASC) 2.5 MG tablet Take 1 tablet (2.5 mg total) by mouth daily.  90 tablet  3  . CALCIUM PO Take by mouth daily.        . carbamazepine (TEGRETOL) 200 MG tablet Take 200 mg by mouth Daily.      . clopidogrel (PLAVIX) 75 MG tablet Take 75 mg by mouth Daily.      Marland Kitchen CRANBERRY PO Take by mouth. Taking  2000 daily       . Docusate Calcium (STOOL SOFTENER PO) Take by mouth. As needed       . ezetimibe (ZETIA) 10 MG tablet Take 1 tablet (10 mg total) by mouth daily.  90 tablet  3  . HYDROcodone-acetaminophen (LORTAB) 10-500 MG per tablet Take 1 tablet by mouth every 6 (six) hours as needed.  300 tablet  1  . losartan-hydrochlorothiazide (HYZAAR) 100-25 MG per tablet Take 1 tablet by mouth daily.  90 tablet  3  . metoprolol (LOPRESSOR) 50 MG tablet Take  1 tablet (50 mg total) by mouth 2 (two) times daily.  180 tablet  3  . Misc Natural Products (OSTEO BI-FLEX JOINT SHIELD PO) Take by mouth 2 (two) times daily.      . Multiple Vitamin (MULTIVITAMIN) tablet Take 1 tablet by mouth daily.        . nitroGLYCERIN (NITROSTAT) 0.4 MG SL tablet Place 1 tablet (0.4 mg total) under the tongue every 5 (five) minutes as needed.  100 tablet  prn  . rosuvastatin (CRESTOR) 10 MG tablet Take 5 mg by mouth every other day.         No current facility-administered medications for this visit.    Allergies  Allergen Reactions  . Gabapentin   . Lipitor (Atorvastatin Calcium)   . Niaspan (Niacin)     Patient Active Problem List  Diagnosis  . Benign hypertensive heart disease without heart failure  . Hx of CABG  . S/P aortic valve replacement with bioprosthetic valve  . Hypercholesterolemia  . Osteoarthritis  . Malaise and fatigue    History  Smoking status  . Former Smoker  . Quit date: 09/13/1985  Smokeless tobacco  . Not on file    History  Alcohol Use No    Family History  Problem Relation Age of Onset  .  Stroke Mother   . Aneurysm Mother   . Aneurysm Father   . Heart disease Brother     Review of Systems: Constitutional: no fever chills diaphoresis or fatigue or change in weight.  Head and neck: no hearing loss, no epistaxis, no photophobia or visual disturbance. Respiratory: No cough, shortness of breath or wheezing. Cardiovascular: No chest pain peripheral edema, palpitations. Gastrointestinal: No abdominal distention, no abdominal pain, no change in bowel habits hematochezia or melena. Genitourinary: No dysuria, no frequency, no urgency, no nocturia. Musculoskeletal:No arthralgias, no back pain, no gait disturbance or myalgias. Neurological: No dizziness, no headaches, no numbness, no seizures, no syncope, no weakness, no tremors. Hematologic: No lymphadenopathy, no easy bruising. Psychiatric: No confusion, no hallucinations,  no sleep disturbance.    Physical Exam: Filed Vitals:   05/23/12 1041  BP: 116/60  Pulse: 59   the general appearance reveals an elderly gentleman in no acute distress.The head and neck exam reveals pupils equal and reactive.  Extraocular movements are full.  There is no scleral icterus.  The mouth and pharynx are normal.  The neck is supple.  The carotids reveal no bruits.  The jugular venous pressure is normal.  The  thyroid is not enlarged.  There is no lymphadenopathy.  The chest is clear to percussion and auscultation.  There are no rales or rhonchi.  Expansion of the chest is symmetrical.  The precordium is quiet.  The first heart sound is normal.  The second heart sound is physiologically split.  There is no  gallop rub or click.  There is a soft systolic flow murmur across his prosthetic aortic valve.  No diastolic murmur.  There is no abnormal lift or heave.  The abdomen is soft and nontender.  The bowel sounds are normal.  The liver and spleen are not enlarged.  There are no abdominal masses.  There are no abdominal bruits.  Extremities reveal good pedal pulses.  There is no phlebitis or edema.  There is no cyanosis or clubbing.  Strength is normal and symmetrical in all extremities.  There is no lateralizing weakness.  There are no sensory deficits.  The skin is warm and dry.  There is no rash.     Assessment / Plan: Continue same medication.  Recheck in 4 months for followup office visit CBC lipid panel hepatic function panel and basal metabolic panel.  Continue heart healthy diet.  He and his wife continue to be able to live in their own home.  A daughter lives nearby and prepares his medications one week at a time it also comes and helps him pain his bowls once a week etc.

## 2012-05-23 NOTE — Assessment & Plan Note (Signed)
The patient is on ezetimibe and Crestor for his hypercholesterolemia.  He is not having any myalgias.  Blood work is satisfactory.Marland Kitchen

## 2012-05-23 NOTE — Assessment & Plan Note (Signed)
His blood pressure is running good on current therapy.  He is not having symptoms of dizziness or syncope.  No symptoms of CHF.

## 2012-06-15 DIAGNOSIS — M25569 Pain in unspecified knee: Secondary | ICD-10-CM | POA: Diagnosis not present

## 2012-08-02 ENCOUNTER — Other Ambulatory Visit: Payer: Self-pay | Admitting: *Deleted

## 2012-08-02 MED ORDER — METOPROLOL TARTRATE 50 MG PO TABS: 50.0000 mg | ORAL_TABLET | Freq: Two times a day (BID) | ORAL | Status: DC

## 2012-08-31 DIAGNOSIS — G8929 Other chronic pain: Secondary | ICD-10-CM | POA: Diagnosis not present

## 2012-08-31 DIAGNOSIS — I10 Essential (primary) hypertension: Secondary | ICD-10-CM | POA: Diagnosis not present

## 2012-08-31 DIAGNOSIS — E7800 Pure hypercholesterolemia, unspecified: Secondary | ICD-10-CM | POA: Diagnosis not present

## 2012-09-18 ENCOUNTER — Encounter: Payer: Self-pay | Admitting: Neurology

## 2012-09-18 ENCOUNTER — Ambulatory Visit (INDEPENDENT_AMBULATORY_CARE_PROVIDER_SITE_OTHER): Payer: Medicare Other | Admitting: Neurology

## 2012-09-18 VITALS — BP 125/55 | HR 60 | Temp 98.3°F | Ht 69.0 in | Wt 177.0 lb

## 2012-09-18 DIAGNOSIS — R413 Other amnesia: Secondary | ICD-10-CM

## 2012-09-18 DIAGNOSIS — G500 Trigeminal neuralgia: Secondary | ICD-10-CM

## 2012-09-18 DIAGNOSIS — Z8673 Personal history of transient ischemic attack (TIA), and cerebral infarction without residual deficits: Secondary | ICD-10-CM

## 2012-09-18 MED ORDER — CARBAMAZEPINE 200 MG PO TABS: 200.0000 mg | ORAL_TABLET | Freq: Every day | ORAL | Status: DC

## 2012-09-18 NOTE — Patient Instructions (Addendum)
I think overall you are doing fairly well but I do want to suggest a few things today:  Remember to drink plenty of fluid, eat healthy meals and do not skip any meals. Try to eat protein with a every meal and eat a healthy snack such as fruit or nuts in between meals. Try to keep a regular sleep-wake schedule and try to exercise daily, particularly in the form of walking, 20-30 minutes a day, if you can.   Engage in social activities in your community and with your family and try to keep up with current events by reading the newspaper or watching the news.   As far as your medications are concerned, I would like to suggest no medication changes.    As far as diagnostic testing: blood work today.  I suggested followup in 6 months with one of my associates. We will have our nurse, Hermenia Fiscal call you for your appointment.  Our phone number is [phone]. We also have an after hours call service for urgent matters and there is a physician on-call for urgent questions. For any emergencies you know to call 911 or go to the nearest emergency room.

## 2012-09-18 NOTE — Progress Notes (Signed)
Subjective:    Patient ID: Michael Blanchard is a 79 y.o. male.  HPI   Interim history:   Mr. Michael Blanchard is a very pleasant 70 -year-old right-handed gentleman who presents for followup consultation of his memory loss, TN, and prior history of TIA. The patient is unaccompanied today. This is his first followup appointment after Dr. Imagene Gurney retirement and he was last seen by Dr. Sandria Manly on 03/16/2012, and which time Dr. Sandria Manly felt that the patient was stable. The patient has an underlying medical history of hypertension, hyperlipidemia, heart disease, status post aortic valve replacement and five-vessel CABG 25 years ago, trigeminal neuralgia, arthritis and memory loss. His current medications include calcium and vitamin D, metoprolol, Plavix, amlodipine, Osteo Bi-Flex, Crestor, Cynthia, losartan-hydrochlorothiazide, multivitamin, cranberry capsules, Acai capsules, hydrocodone, carbamazepine 200 mg daily.  I reviewed Dr. losartan notes and the patient's records and below is a summary of that revealed:  79 year old right-handed gentleman with a history of trigeminal neuralgia of 25 less years duration. He was first treated with gabapentin for this. He was then placed on carbamazepine. He started having memory problems. In April 2013 he had a TIA with weakness and fall. He was on a baby aspirin daily and Plavix was added. 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 There was kinking of the left ICA. MRA brain showed diffuse atherosclerotic disease with severe narrowing of the bilateral internal carotid arteries at the siphon and moderate to severe stenosis of the inferior basilar artery and focal stenosis of the right PCA. MRI brain showed mild diffuse and moderate mesial temporal atrophy and chronic left cerebellar ischemic infarct. In January 2014 his MMSE was 28, clock drawing was 4, animal fluency was 15.  He states his trigeminal neuralgia is stable. At times, he has to take a second pill in the day.  He has pain in both knees, and wears braces on both knees. He helps take care of his wife who is blind due to glaucoma and macular degeneration.  His Past Medical History Is Significant For: Past Medical History  Diagnosis Date  . Hyperlipidemia   . Hypertension   . AVD (aortic valve disease)   . Coronary atherosclerosis   . Fatigue   . Trigeminal neuralgia     His Past Surgical History Is Significant For: Past Surgical History  Procedure Laterality Date  . Cardiac catheterization  06/22/1999    SEVERE HYPOKINESIA OF THE MID TO DISTAL INFERIOR WALL AND APEX.MODERATE LEFT VENTRICULAR DYSFUNCTION WITH EF 45%  . Aortic valve replacement    . Coronary artery bypass graft  2001    His Family History Is Significant For: Family History  Problem Relation Age of Onset  . Stroke Mother   . Aneurysm Mother   . Aneurysm Father   . Heart disease Brother     His Social History Is Significant For: History   Social History  . Marital Status: Married    Spouse Name: Karena Addison    Number of Children: N/A  . Years of Education: N/A   Occupational History  .     Social History Main Topics  . Smoking status: Former Smoker    Quit date: 09/13/1985  . Smokeless tobacco: None  . Alcohol Use: No  . Drug Use: No  . Sexually Active:    Other Topics Concern  . None   Social History Narrative   Pt lives at home with his spouse.   Caffeine Use:    His Allergies Are:  Allergies  Allergen Reactions  . Gabapentin   . Lipitor (Atorvastatin Calcium)   . Niaspan (Niacin)   :   His Current Medications Are:  Outpatient Encounter Prescriptions as of 09/18/2012  Medication Sig Dispense Refill  . amLODipine (NORVASC) 2.5 MG tablet Take 1 tablet (2.5 mg total) by mouth daily.  90 tablet  3  . Calcium Carbonate-Vitamin D (CALCIUM 600 + D PO) Take 1 tablet by mouth daily.      . carbamazepine (TEGRETOL) 200 MG tablet Take 1 tablet (200 mg total) by mouth daily.  90 tablet  3  . clopidogrel  (PLAVIX) 75 MG tablet Take 75 mg by mouth Daily.      Marland Kitchen CRANBERRY PO Take by mouth. Taking  2000 daily       . ezetimibe (ZETIA) 10 MG tablet Take 1 tablet (10 mg total) by mouth daily.  90 tablet  3  . HYDROcodone-acetaminophen (LORTAB) 10-500 MG per tablet Take 1 tablet by mouth every 6 (six) hours as needed.  300 tablet  1  . losartan-hydrochlorothiazide (HYZAAR) 100-25 MG per tablet Take 1 tablet by mouth daily.  90 tablet  3  . metoprolol (LOPRESSOR) 50 MG tablet Take 1 tablet (50 mg total) by mouth 2 (two) times daily.  180 tablet  3  . Misc Natural Products (OSTEO BI-FLEX JOINT SHIELD PO) Take by mouth 2 (two) times daily.      . Multiple Vitamin (MULTIVITAMIN) tablet Take 1 tablet by mouth daily.        . rosuvastatin (CRESTOR) 10 MG tablet Take 5 mg by mouth every other day.        . [DISCONTINUED] carbamazepine (TEGRETOL) 200 MG tablet Take 200 mg by mouth Daily.      . [DISCONTINUED] ACAI PO Take by mouth. Taking 2 daily       . [DISCONTINUED] alclomethasone (ACLOVATE) 0.05 % cream USE AS DIRECTED  15 g  4  . [DISCONTINUED] CALCIUM PO Take by mouth daily.        . [DISCONTINUED] Docusate Calcium (STOOL SOFTENER PO) Take by mouth. As needed       . [DISCONTINUED] nitroGLYCERIN (NITROSTAT) 0.4 MG SL tablet Place 1 tablet (0.4 mg total) under the tongue every 5 (five) minutes as needed.  100 tablet  prn   No facility-administered encounter medications on file as of 09/18/2012.   Review of Systems  Constitutional: Positive for fatigue.  HENT: Positive for hearing loss.   Eyes: Positive for pain.  Respiratory: Positive for shortness of breath.   Cardiovascular: Positive for leg swelling.  Musculoskeletal: Positive for arthralgias.  Neurological:       Memory loss  Psychiatric/Behavioral: Positive for sleep disturbance (restles legs, not enough sleep).    Objective:  Neurologic Exam  Physical Exam Physical Examination:   Filed Vitals:   09/18/12 1439  BP: 125/55  Pulse: 60   Temp: 98.3 F (36.8 C)    General Examination: The patient is a very pleasant 79 y.o. male in no acute distress. He appears well-developed and well-nourished and well groomed.   HEENT: Normocephalic, atraumatic, pupils are equal, round and reactive to light and accommodation. Funduscopic exam is normal with sharp disc margins noted. Extraocular tracking is good without limitation to gaze excursion or nystagmus noted. Normal smooth pursuit is noted. Hearing is grossly intact. Tympanic membranes are clear bilaterally. Face is symmetric with normal facial animation and normal facial sensation. Speech is clear with no dysarthria noted. There is no hypophonia. There is no  lip, neck/head, jaw or voice tremor. Neck is supple with full range of passive and active motion. There are no carotid bruits on auscultation. Oropharynx exam reveals: mild mouth dryness, adequate dental hygiene and mild airway crowding. Mallampati is class I. Tongue protrudes centrally and palate elevates symmetrically.    Chest: Clear to auscultation without wheezing, rhonchi or crackles noted.  Heart: S1+S2+0, regular and normal without murmurs, rubs or gallops noted.   Abdomen: Soft, non-tender and non-distended with normal bowel sounds appreciated on auscultation.  Extremities: There is 1+ pitting edema in the distal lower extremities bilaterally around the ankles. He wears braces on both knees.    Skin: Warm and dry without trophic changes noted.  Musculoskeletal: exam reveals no obvious joint deformities, tenderness or joint swelling or erythema.   Neurologically:  Mental status: The patient is awake, alert and oriented in all 4 spheres. His memory, attention, language and knowledge are fairly well preserved. There is no aphasia, agnosia, apraxia or anomia. Speech is clear with normal prosody and enunciation. Thought process is linear. Mood is congruent and affect is normal.  Cranial nerves are as described above under  HEENT exam. In addition, shoulder shrug is normal with equal shoulder height noted. Motor exam: Normal bulk, strength and tone is noted. There is no drift, tremor or rebound. Reflexes are 1+ throughout. Fine motor skills are mildly impaired.  Cerebellar testing shows no dysmetria or intention tremor on finger to nose testing. There is no truncal or gait ataxia.  Sensory exam is intact to light touch, pinprick, vibration, temperature sense and proprioception in the upper and lower extremities.  Gait, station and balance: he stands slowly and walks slowly with a slight waddle. No veering to one side is noted. No leaning to one side is noted. Posture is age-appropriate and stance is mildly wide based. He turns in 3 steps.                Assessment and Plan:   Assessment and Plan:  In summary, KISEAN ROLLO is a very pleasant 79 y.o.-year old male with a history of trigeminal neuralgia, hx of TIA, and mild memory loss. His physical exam is stable and he has not progressed in the last 6 months. I reassured the patient in that regard.  I had a long chat with the patient my findings and the diagnosis of TN, TIA and memory loss. We talked about medical treatments and non-pharmacological approaches. We talked about trying to maintain a healthy lifestyle in general. I encouraged the patient to eat healthy, exercise daily and keep well hydrated, to keep a scheduled bedtime and wake time routine, to not skip any meals and eat healthy snacks in between meals and to have protein with every meal. He tries to keep up with current events.  I suggested no new medication changes today, but didn't suggest some blood work today because he is on chronic Tegretol treatment. Clinically,  he is stable and I would suggest a followup in 6 months. I asked him to followup with one of my associates. I explained to him that since Dr. Imagene Gurney retirement we are trying to make sure that all his patients are getting a smooth transition. We  are also trying to get his patients scheduled with the neurologist with the most appropriate subspecialty training which is right for the patient. He understands this and is in agreement. To that and I will have our nurse, Hermenia Fiscal, call him for his appointment. I renewed his  Tegretol prescription and we will be calling him with his blood work results.

## 2012-09-19 LAB — COMPREHENSIVE METABOLIC PANEL
ALT: 11 IU/L (ref 0–44)
Albumin: 4.5 g/dL (ref 3.5–4.7)
BUN: 26 mg/dL (ref 8–27)
CO2: 25 mmol/L (ref 18–29)
Calcium: 9.6 mg/dL (ref 8.6–10.2)
Chloride: 103 mmol/L (ref 97–108)
Glucose: 89 mg/dL (ref 65–99)
Potassium: 4.1 mmol/L (ref 3.5–5.2)
Total Bilirubin: 0.1 mg/dL (ref 0.0–1.2)
Total Protein: 7.2 g/dL (ref 6.0–8.5)

## 2012-09-19 LAB — CBC WITH DIFFERENTIAL
Basos: 0 % (ref 0–3)
Eosinophils Absolute: 0.2 10*3/uL (ref 0.0–0.4)
Hemoglobin: 13.1 g/dL (ref 12.6–17.7)
Immature Grans (Abs): 0 10*3/uL (ref 0.0–0.1)
Lymphs: 30 % (ref 14–46)
MCHC: 33.2 g/dL (ref 31.5–35.7)
Monocytes: 8 % (ref 4–12)
Neutrophils Relative %: 59 % (ref 40–74)
RBC: 4.03 x10E6/uL — ABNORMAL LOW (ref 4.14–5.80)
WBC: 7.2 10*3/uL (ref 3.4–10.8)

## 2012-09-19 LAB — CARBAMAZEPINE LEVEL, TOTAL: Carbamazepine Lvl: 3.4 ug/mL — ABNORMAL LOW (ref 4.0–12.0)

## 2012-09-19 NOTE — Progress Notes (Signed)
Quick Note:  Please call and advise the patient that the recent labs we checked were within normal limits. We checked cell count, blood sugar, electrolytes, kidney function, liver function. No further action is required on these tests at this time. Please remind patient to keep any upcoming appointments or tests and to call us with any interim questions, concerns, problems or updates. Thanks,  Shonta Bourque, MD, PhD    ______ 

## 2012-09-20 NOTE — Progress Notes (Signed)
Quick Note:  Spoke with patient and relayed results of blood work. Patient understood and had no questions.  ______ 

## 2012-09-21 ENCOUNTER — Ambulatory Visit (INDEPENDENT_AMBULATORY_CARE_PROVIDER_SITE_OTHER): Payer: Medicare Other | Admitting: Cardiology

## 2012-09-21 ENCOUNTER — Encounter: Payer: Self-pay | Admitting: Cardiology

## 2012-09-21 VITALS — BP 126/62 | HR 60 | Ht 69.0 in | Wt 179.4 lb

## 2012-09-21 DIAGNOSIS — Z952 Presence of prosthetic heart valve: Secondary | ICD-10-CM

## 2012-09-21 DIAGNOSIS — Z954 Presence of other heart-valve replacement: Secondary | ICD-10-CM

## 2012-09-21 DIAGNOSIS — E7800 Pure hypercholesterolemia, unspecified: Secondary | ICD-10-CM | POA: Diagnosis not present

## 2012-09-21 DIAGNOSIS — I119 Hypertensive heart disease without heart failure: Secondary | ICD-10-CM | POA: Diagnosis not present

## 2012-09-21 DIAGNOSIS — Z951 Presence of aortocoronary bypass graft: Secondary | ICD-10-CM

## 2012-09-21 DIAGNOSIS — Z953 Presence of xenogenic heart valve: Secondary | ICD-10-CM

## 2012-09-21 NOTE — Patient Instructions (Addendum)
Your physician recommends that you continue on your current medications as directed. Please refer to the Current Medication list given to you today.  Your physician wants you to follow-up in: 4 month ov/ekg  You will receive a reminder letter in the mail two months in advance. If you don't receive a letter, please call our office to schedule the follow-up appointment.  

## 2012-09-21 NOTE — Assessment & Plan Note (Signed)
No angina pectoris. 

## 2012-09-21 NOTE — Assessment & Plan Note (Signed)
The patient has not been having any symptoms of congestive heart failure.  No angina pectoris.  No dizziness or syncope.. 

## 2012-09-21 NOTE — Assessment & Plan Note (Addendum)
The patient has a history of hypercholesterolemia.  He is on low-dose Crestor 5 mg every other day.  He had recent lab work from his PCP stated to be good.  He has not been having any myalgias from the statin therapy.  He is also on ezetimibe

## 2012-09-21 NOTE — Assessment & Plan Note (Signed)
Blood pressure was remaining stable on current therapy 

## 2012-09-21 NOTE — Progress Notes (Signed)
Michael Blanchard Date of Birth:  September 21, 1925 [facility] [address] [place], [place]  [address] [phone]         Fax   [phone]  History of Present Illness: This pleasant 79 year old gentleman is seen for a scheduled followup office visit. He has a history of ischemic heart disease and valvular heart disease. He had coronary artery bypass graft surgery as well as aortic valve replacement using a tissue valve in 2001. He has done well postoperatively. He had a nuclear stress test in 2007 showing no evidence of ischemia. His ejection fraction was 59%. He had an echocardiogram in February 2010 showing normal LV systolic function with impaired relaxation and showed normal tissue aortic valve replacement with no aortic insufficiency. He has a history of high blood pressure and history of hypercholesterolemia. Since last visit he has been doing well except for chronic arthritis of both of his knees. His primary care provider is Dr. Nehemiah Settle.   Current Outpatient Prescriptions  Medication Sig Dispense Refill  . amLODipine (NORVASC) 2.5 MG tablet Take 1 tablet (2.5 mg total) by mouth daily.  90 tablet  3  . Calcium Carbonate-Vitamin D (CALCIUM 600 + D PO) Take 1 tablet by mouth daily.      . carbamazepine (TEGRETOL) 200 MG tablet Take 1 tablet (200 mg total) by mouth daily.  90 tablet  3  . clopidogrel (PLAVIX) 75 MG tablet Take 75 mg by mouth Daily.      Marland Kitchen CRANBERRY PO Take by mouth. Taking  2000 daily       . ezetimibe (ZETIA) 10 MG tablet Take 1 tablet (10 mg total) by mouth daily.  90 tablet  3  . HYDROcodone-acetaminophen (LORTAB) 10-500 MG per tablet Take 1 tablet by mouth every 6 (six) hours as needed.  300 tablet  1  . losartan-hydrochlorothiazide (HYZAAR) 100-25 MG per tablet Take 1 tablet by mouth daily.  90 tablet  3  . metoprolol (LOPRESSOR) 50 MG tablet Take 1 tablet (50 mg total) by mouth 2 (two) times daily.  180 tablet  3  . Misc Natural Products (OSTEO  BI-FLEX JOINT SHIELD PO) Take by mouth 2 (two) times daily.      . Multiple Vitamin (MULTIVITAMIN) tablet Take 1 tablet by mouth daily.        . rosuvastatin (CRESTOR) 10 MG tablet Take 5 mg by mouth every other day.         No current facility-administered medications for this visit.    Allergies  Allergen Reactions  . Gabapentin   . Lipitor (Atorvastatin Calcium)   . Niaspan (Niacin)     Patient Active Problem List   Diagnosis Date Noted  . Malaise and fatigue 01/25/2011  . Benign hypertensive heart disease without heart failure 09/24/2010  . Hx of CABG 09/24/2010  . S/P aortic valve replacement with bioprosthetic valve 09/24/2010  . Hypercholesterolemia 09/24/2010  . Osteoarthritis 09/24/2010    History  Smoking status  . Former Smoker  . Quit date: 09/13/1985  Smokeless tobacco  . Not on file    History  Alcohol Use No    Family History  Problem Relation Age of Onset  . Stroke Mother   . Aneurysm Mother   . Aneurysm Father   . Heart disease Brother     Review of Systems: Constitutional: no fever chills diaphoresis or fatigue or change in weight.  Head and neck: no hearing loss, no epistaxis, no photophobia or visual disturbance. Respiratory: No  cough, shortness of breath or wheezing. Cardiovascular: No chest pain peripheral edema, palpitations. Gastrointestinal: No abdominal distention, no abdominal pain, no change in bowel habits hematochezia or melena. Genitourinary: No dysuria, no frequency, no urgency, no nocturia. Musculoskeletal:No arthralgias, no back pain, no gait disturbance or myalgias. Neurological: No dizziness, no headaches, no numbness, no seizures, no syncope, no weakness, no tremors. Hematologic: No lymphadenopathy, no easy bruising. Psychiatric: No confusion, no hallucinations, no sleep disturbance.    Physical Exam: Filed Vitals:   09/21/12 0935  BP: 126/62  Pulse: 60   the general appearance reveals a well-developed well-nourished  elderly gentleman in no distress.The head and neck exam reveals pupils equal and reactive.  Extraocular movements are full.  There is no scleral icterus.  The mouth and pharynx are normal.  The neck is supple.  The carotids reveal no bruits.  The jugular venous pressure is normal.  The  thyroid is not enlarged.  There is no lymphadenopathy.  The chest is clear to percussion and auscultation.  There are no rales or rhonchi.  Expansion of the chest is symmetrical.  The precordium is quiet.  The first heart sound is normal.  The second heart sound is physiologically split.  There is  a soft systolic ejection murmur across her prosthetic aortic valve.  There is no diastolic murmur There is no abnormal lift or heave.  The abdomen is soft and nontender.  The bowel sounds are normal.  The liver and spleen are not enlarged.  There are no abdominal masses.  There are no abdominal bruits.  Extremities reveal good pedal pulses.  There is no phlebitis or edema.  There is no cyanosis or clubbing.  Strength is normal and symmetrical in all extremities.  There is no lateralizing weakness.  There are no sensory deficits.  The skin is warm and dry.  There is no rash.    Assessment / Plan: Continue on same medication.  No blood work today since he recently had blood work at [employer] in 4 months for office visit and EKG

## 2012-10-31 ENCOUNTER — Other Ambulatory Visit: Payer: Self-pay

## 2012-10-31 MED ORDER — CLOPIDOGREL BISULFATE 75 MG PO TABS: 75.0000 mg | ORAL_TABLET | Freq: Every day | ORAL | Status: DC

## 2012-11-03 ENCOUNTER — Telehealth: Payer: Self-pay | Admitting: Cardiology

## 2012-11-03 NOTE — Telephone Encounter (Signed)
New Prob  Pt daughter would like to speak with a nurse a medication of her dads.

## 2012-11-03 NOTE — Telephone Encounter (Signed)
I spoke with patient's daughter and let her know at last office visit with Dr Patty Sermons ASA 81mg  was not on his list.  She appreciated my call

## 2012-11-08 ENCOUNTER — Telehealth: Payer: Self-pay | Admitting: Cardiology

## 2012-11-08 NOTE — Telephone Encounter (Signed)
**Note De-Identified  Obfuscation** [name], pts daughter, advised. She verbalized understanding.

## 2012-11-08 NOTE — Telephone Encounter (Signed)
New Problem  Daughter would like to discuss medications for father.. Confused about BP medications.

## 2013-01-24 ENCOUNTER — Ambulatory Visit (INDEPENDENT_AMBULATORY_CARE_PROVIDER_SITE_OTHER): Payer: Medicare Other | Admitting: Cardiology

## 2013-01-24 ENCOUNTER — Encounter: Payer: Self-pay | Admitting: Cardiology

## 2013-01-24 VITALS — BP 122/68 | HR 56 | Ht 69.0 in | Wt 170.0 lb

## 2013-01-24 DIAGNOSIS — Z951 Presence of aortocoronary bypass graft: Secondary | ICD-10-CM

## 2013-01-24 DIAGNOSIS — Z952 Presence of prosthetic heart valve: Secondary | ICD-10-CM

## 2013-01-24 DIAGNOSIS — I119 Hypertensive heart disease without heart failure: Secondary | ICD-10-CM

## 2013-01-24 DIAGNOSIS — E7800 Pure hypercholesterolemia, unspecified: Secondary | ICD-10-CM | POA: Diagnosis not present

## 2013-01-24 DIAGNOSIS — Z953 Presence of xenogenic heart valve: Secondary | ICD-10-CM

## 2013-01-24 NOTE — Assessment & Plan Note (Signed)
The patient has not had any symptoms of CHF.  No fever chills or night sweats or constitutional symptoms.

## 2013-01-24 NOTE — Assessment & Plan Note (Signed)
Blood pressure was remaining stable.  No headaches or dizzy spells.

## 2013-01-24 NOTE — Patient Instructions (Signed)
Your physician recommends that you continue on your current medications as directed. Please refer to the Current Medication list given to you today.  Your physician recommends that you schedule a follow-up appointment in: 4 months with  Dr. Patty Sermons.

## 2013-01-24 NOTE — Assessment & Plan Note (Signed)
The patient has not been experiencing any exertional chest discomfort or angina pectoris.  Because his wife is blind, the patient has to do all of the housework as well as caring for his wife.  This gives him very busy.  Fortunately he is not experiencing any exertional chest pain.

## 2013-01-24 NOTE — Progress Notes (Signed)
Michael Blanchard Date of Birth:  1926/03/08 [facility] [address] [place], [place]  [address] [phone]         Fax   [phone]  History of Present Illness: This pleasant 79 year old gentleman is seen for a scheduled followup office visit. He has a history of ischemic heart disease and valvular heart disease. He had coronary artery bypass graft surgery as well as aortic valve replacement using a tissue valve in 2001. He has done well postoperatively. He had a nuclear stress test in 2007 showing no evidence of ischemia. His ejection fraction was 59%. He had an echocardiogram in February 2010 showing normal LV systolic function with impaired relaxation and showed normal tissue aortic valve replacement with no aortic insufficiency. He has a history of high blood pressure and history of hypercholesterolemia. Since last visit he has been doing well except for chronic arthritis of both of his knees. His primary care provider is Dr. Nehemiah Settle.   Current Outpatient Prescriptions  Medication Sig Dispense Refill  . amLODipine (NORVASC) 2.5 MG tablet Take 1 tablet (2.5 mg total) by mouth daily.  90 tablet  3  . Calcium Carbonate-Vitamin D (CALCIUM 600 + D PO) Take 1 tablet by mouth daily.      . carbamazepine (TEGRETOL) 200 MG tablet Take 1 tablet (200 mg total) by mouth daily.  90 tablet  3  . clopidogrel (PLAVIX) 75 MG tablet Take 1 tablet (75 mg total) by mouth daily.  90 tablet  1  . CRANBERRY PO Take by mouth. Taking  2000 daily       . ezetimibe (ZETIA) 10 MG tablet Take 1 tablet (10 mg total) by mouth daily.  90 tablet  3  . HYDROcodone-acetaminophen (LORTAB) 10-500 MG per tablet Take 1 tablet by mouth every 6 (six) hours as needed.  300 tablet  1  . losartan-hydrochlorothiazide (HYZAAR) 100-25 MG per tablet Take 1 tablet by mouth daily.  90 tablet  3  . metoprolol (LOPRESSOR) 50 MG tablet Take 1 tablet (50 mg total) by mouth 2 (two) times daily.  180 tablet  3  . Misc  Natural Products (OSTEO BI-FLEX JOINT SHIELD PO) Take by mouth 2 (two) times daily.      . Multiple Vitamin (MULTIVITAMIN) tablet Take 1 tablet by mouth daily.        . rosuvastatin (CRESTOR) 10 MG tablet Take 5 mg by mouth every other day.         No current facility-administered medications for this visit.    Allergies  Allergen Reactions  . Gabapentin   . Lipitor [Atorvastatin Calcium]   . Niaspan [Niacin]     Patient Active Problem List   Diagnosis Date Noted  . Malaise and fatigue 01/25/2011  . Benign hypertensive heart disease without heart failure 09/24/2010  . Hx of CABG 09/24/2010  . S/P aortic valve replacement with bioprosthetic valve 09/24/2010  . Hypercholesterolemia 09/24/2010  . Osteoarthritis 09/24/2010    History  Smoking status  . Former Smoker  . Quit date: 09/13/1985  Smokeless tobacco  . Not on file    History  Alcohol Use No    Family History  Problem Relation Age of Onset  . Stroke Mother   . Aneurysm Mother   . Aneurysm Father   . Heart disease Brother     Review of Systems: Constitutional: no fever chills diaphoresis or fatigue or change in weight.  Head and neck: no hearing loss, no epistaxis, no photophobia or  visual disturbance. Respiratory: No cough, shortness of breath or wheezing. Cardiovascular: No chest pain peripheral edema, palpitations. Gastrointestinal: No abdominal distention, no abdominal pain, no change in bowel habits hematochezia or melena. Genitourinary: No dysuria, no frequency, no urgency, no nocturia. Musculoskeletal:No arthralgias, no back pain, no gait disturbance or myalgias. Neurological: No dizziness, no headaches, no numbness, no seizures, no syncope, no weakness, no tremors. Hematologic: No lymphadenopathy, no easy bruising. Psychiatric: No confusion, no hallucinations, no sleep disturbance.    Physical Exam: Filed Vitals:   01/24/13 1635  BP: 122/68  Pulse: 56   the general appearance reveals a  well-developed well-nourished elderly gentleman in no distress.The head and neck exam reveals pupils equal and reactive.  Extraocular movements are full.  There is no scleral icterus.  The mouth and pharynx are normal.  The neck is supple.  The carotids reveal no bruits.  The jugular venous pressure is normal.  The  thyroid is not enlarged.  There is no lymphadenopathy.  The chest is clear to percussion and auscultation.  There are no rales or rhonchi.  Expansion of the chest is symmetrical.  The precordium is quiet.  The first heart sound is normal.  The second heart sound is physiologically split.  There is  a soft systolic ejection murmur across her prosthetic aortic valve.  There is no diastolic murmur There is no abnormal lift or heave.  The abdomen is soft and nontender.  The bowel sounds are normal.  The liver and spleen are not enlarged.  There are no abdominal masses.  There are no abdominal bruits.  Extremities reveal good pedal pulses.  There is no phlebitis or edema.  There is no cyanosis or clubbing.  Strength is normal and symmetrical in all extremities.  There is no lateralizing weakness.  There are no sensory deficits.  The skin is warm and dry.  There is no rash.  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  No old EKGs to compare.  Assessment / Plan: Continue on same medication.  He will be getting his blood work at his PCP office Recheck in 4 months for office visit and repeat EKG at that time to assure stability.

## 2013-01-26 ENCOUNTER — Other Ambulatory Visit: Payer: Self-pay

## 2013-01-26 DIAGNOSIS — I119 Hypertensive heart disease without heart failure: Secondary | ICD-10-CM

## 2013-01-26 MED ORDER — LOSARTAN POTASSIUM-HCTZ 100-25 MG PO TABS: 1.0000 | ORAL_TABLET | Freq: Every day | ORAL | Status: DC

## 2013-03-01 DIAGNOSIS — G894 Chronic pain syndrome: Secondary | ICD-10-CM | POA: Diagnosis not present

## 2013-03-01 DIAGNOSIS — Z79899 Other long term (current) drug therapy: Secondary | ICD-10-CM | POA: Diagnosis not present

## 2013-03-01 DIAGNOSIS — M19019 Primary osteoarthritis, unspecified shoulder: Secondary | ICD-10-CM | POA: Diagnosis not present

## 2013-03-01 DIAGNOSIS — M79609 Pain in unspecified limb: Secondary | ICD-10-CM | POA: Diagnosis not present

## 2013-03-02 ENCOUNTER — Other Ambulatory Visit: Payer: Self-pay | Admitting: *Deleted

## 2013-03-02 DIAGNOSIS — I119 Hypertensive heart disease without heart failure: Secondary | ICD-10-CM

## 2013-03-02 MED ORDER — AMLODIPINE BESYLATE 2.5 MG PO TABS: 2.5000 mg | ORAL_TABLET | Freq: Every day | ORAL | Status: DC

## 2013-03-20 DIAGNOSIS — I10 Essential (primary) hypertension: Secondary | ICD-10-CM | POA: Diagnosis not present

## 2013-03-20 DIAGNOSIS — Z23 Encounter for immunization: Secondary | ICD-10-CM | POA: Diagnosis not present

## 2013-03-20 DIAGNOSIS — E7800 Pure hypercholesterolemia, unspecified: Secondary | ICD-10-CM | POA: Diagnosis not present

## 2013-03-20 DIAGNOSIS — L989 Disorder of the skin and subcutaneous tissue, unspecified: Secondary | ICD-10-CM | POA: Diagnosis not present

## 2013-03-20 DIAGNOSIS — G8929 Other chronic pain: Secondary | ICD-10-CM | POA: Diagnosis not present

## 2013-03-21 ENCOUNTER — Ambulatory Visit (INDEPENDENT_AMBULATORY_CARE_PROVIDER_SITE_OTHER): Payer: Medicare Other | Admitting: Neurology

## 2013-03-21 ENCOUNTER — Encounter (INDEPENDENT_AMBULATORY_CARE_PROVIDER_SITE_OTHER): Payer: Self-pay

## 2013-03-21 ENCOUNTER — Encounter: Payer: Self-pay | Admitting: Neurology

## 2013-03-21 VITALS — BP 158/64 | HR 63 | Ht 69.0 in | Wt 180.0 lb

## 2013-03-21 DIAGNOSIS — G500 Trigeminal neuralgia: Secondary | ICD-10-CM | POA: Insufficient documentation

## 2013-03-21 DIAGNOSIS — E7800 Pure hypercholesterolemia, unspecified: Secondary | ICD-10-CM | POA: Diagnosis not present

## 2013-03-21 DIAGNOSIS — Z951 Presence of aortocoronary bypass graft: Secondary | ICD-10-CM

## 2013-03-21 DIAGNOSIS — Z952 Presence of prosthetic heart valve: Secondary | ICD-10-CM

## 2013-03-21 DIAGNOSIS — M1990 Unspecified osteoarthritis, unspecified site: Secondary | ICD-10-CM

## 2013-03-21 DIAGNOSIS — I119 Hypertensive heart disease without heart failure: Secondary | ICD-10-CM

## 2013-03-21 DIAGNOSIS — Z953 Presence of xenogenic heart valve: Secondary | ICD-10-CM

## 2013-03-21 MED ORDER — CLOPIDOGREL BISULFATE 75 MG PO TABS: 75.0000 mg | ORAL_TABLET | Freq: Every day | ORAL | Status: DC

## 2013-03-21 MED ORDER — CARBAMAZEPINE 200 MG PO TABS: 200.0000 mg | ORAL_TABLET | Freq: Every day | ORAL | Status: DC

## 2013-03-21 NOTE — Progress Notes (Signed)
Subjective:    Patient ID: Michael Blanchard is a 80 y.o. male.  HPI   Interim history:   Michael Blanchard is a very pleasant 33 -year-old right-handed gentleman who presents for followup consultation of his memory loss, trigeminal neuralgia, and prior history of TIA. The patient is unaccompanied today. Last visit was with Dr. Rexene Alberts in July 2014.   He was last seen by Dr. Erling Cruz in 03/16/2012,  He has past medical history of hypertension, hyperlipidemia, heart disease, status post aortic valve replacement and five-vessel CABG 25 years ago, trigeminal neuralgia, arthritis and memory loss. His current medications include calcium and vitamin D, metoprolol, Plavix, amlodipine, Osteo Bi-Flex, Crestor, Cynthia, losartan-hydrochlorothiazide, multivitamin, cranberry capsules, Acai capsules, hydrocodone, carbamazepine 200 mg daily.  He had a history of trigeminal neuralgia of 25 less years duration. He was first treated with gabapentin for this. He was then placed on carbamazepine. He started having memory problems. In April 2013 he had a TIA with weakness and fall. He was on a baby aspirin daily and Plavix was added. 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 There was kinking of the left ICA. MRA brain showed diffuse atherosclerotic disease with severe narrowing of the bilateral internal carotid arteries at the siphon and moderate to severe stenosis of the inferior basilar artery and focal stenosis of the right PCA. MRI brain showed mild diffuse and moderate mesial temporal atrophy and chronic left cerebellar ischemic infarct.  In January 2014 his MMSE was 28, clock drawing was 4, animal fluency was 15.  He states his trigeminal neuralgia is stable. He is taking carbamazepine 200 mg daily, he still has occasionally left upper eyelid, left nasal bridge shooting pain, he is also on frequent oxycodone, hydrocodone for his chronic bilateral knee pain, and low back pain, he is happy about current pain  control,   Last Tegretol level was 3 point 4 in July 2014  Past Medical History Is Significant For: Past Medical History  Diagnosis Date  . Hyperlipidemia   . Hypertension   . AVD (aortic valve disease)   . Coronary atherosclerosis   . Fatigue   . Trigeminal neuralgia     His Past Surgical History Is Significant For: Past Surgical History  Procedure Laterality Date  . Cardiac catheterization  06/22/1999    SEVERE HYPOKINESIA OF THE MID TO DISTAL INFERIOR WALL AND APEX.MODERATE LEFT VENTRICULAR DYSFUNCTION WITH EF 45%  . Aortic valve replacement    . Coronary artery bypass graft  2001    His Family History Is Significant For: Family History  Problem Relation Age of Onset  . Stroke Mother   . Aneurysm Mother   . Aneurysm Father   . Heart disease Brother     His Social History Is Significant For: History   Social History  . Marital Status: Married    Spouse Name: Murray Hodgkins    Number of Children: N/A  . Years of Education: N/A   Occupational History  .     Social History Main Topics  . Smoking status: Former Smoker    Quit date: 09/13/1985  . Smokeless tobacco: Not on file  . Alcohol Use: No  . Drug Use: No  . Sexual Activity:    Other Topics Concern  . Not on file   Social History Narrative   Pt lives at home with his spouse.   Caffeine Use:    His Allergies Are:  Allergies  Allergen Reactions  . Gabapentin   . Lipitor [Atorvastatin Calcium]   .  Niaspan [Niacin]   :   His Current Medications Are:  Outpatient Encounter Prescriptions as of 03/21/2013  Medication Sig  . amLODipine (NORVASC) 2.5 MG tablet Take 1 tablet (2.5 mg total) by mouth daily.  . Calcium Carbonate-Vitamin D (CALCIUM 600 + D PO) Take 1 tablet by mouth daily.  . carbamazepine (TEGRETOL) 200 MG tablet Take 1 tablet (200 mg total) by mouth daily.  . clopidogrel (PLAVIX) 75 MG tablet Take 1 tablet (75 mg total) by mouth daily.  Marland Kitchen CRANBERRY PO Take by mouth. Taking  2000 daily   .  ezetimibe (ZETIA) 10 MG tablet Take 1 tablet (10 mg total) by mouth daily.  Marland Kitchen HYDROcodone-acetaminophen (LORTAB) 10-500 MG per tablet Take 1 tablet by mouth every 6 (six) hours as needed.  Marland Kitchen losartan-hydrochlorothiazide (HYZAAR) 100-25 MG per tablet Take 1 tablet by mouth daily.  . metoprolol (LOPRESSOR) 50 MG tablet Take 1 tablet (50 mg total) by mouth 2 (two) times daily.  . Misc Natural Products (OSTEO BI-FLEX JOINT SHIELD PO) Take by mouth 2 (two) times daily.  . Multiple Vitamin (MULTIVITAMIN) tablet Take 1 tablet by mouth daily.    . rosuvastatin (CRESTOR) 10 MG tablet Take 5 mg by mouth every other day.     Review of Systems Is positive for bilateral knee pain, gait difficulty,   PHYSICAL EXAMINATOINS:  Generalized: In no acute distress  Neck: Supple, no carotid bruits   Cardiac: Regular rate rhythm  Pulmonary: Clear to auscultation bilaterally  Musculoskeletal: No deformity  Neurological examination  Mentation: Alert oriented to time, place, history taking, and causual conversation, MMSE 30/30  Cranial nerve II-XII: Pupils were equal round reactive to light extraocular movements were full, visual field were full on confrontational test.  Bilateral fundi were sharp  Facial sensation and strength were normal. hearing was intact to finger rubbing bilaterally. Uvula tongue midline.  head turning and shoulder shrug and were normal and symmetric.Tongue protrusion into cheek strength was normal.  Motor: normal tone, bulk and strength.  Sensory: Intact to fine touch   Coordination: Normal finger to nose, heel-to-shin bilaterally there was no truncal ataxia  Gait: atalgic, cautious gait.  Deep tendon reflexes: Brachioradialis 1/1, biceps 1/1, triceps 1/1, patellar absent, Achilles absent, plantar responses were flexor bilaterally.  Assessment and Plan:  80 years old Caucasian male, with past medical history of left trigeminal neuralgia, doing very well, with his current dose of  carbamazepine, 200 mg every day, he is also has multiple vascular risk factors, on aspirin, and Plavix, and refill his Plavix, and  carbamazepine, a year supply, return to clinic in one year,

## 2013-03-29 DIAGNOSIS — Z79899 Other long term (current) drug therapy: Secondary | ICD-10-CM | POA: Diagnosis not present

## 2013-03-29 DIAGNOSIS — IMO0002 Reserved for concepts with insufficient information to code with codable children: Secondary | ICD-10-CM | POA: Diagnosis not present

## 2013-03-29 DIAGNOSIS — G894 Chronic pain syndrome: Secondary | ICD-10-CM | POA: Diagnosis not present

## 2013-04-09 ENCOUNTER — Other Ambulatory Visit: Payer: Self-pay | Admitting: Dermatology

## 2013-04-09 DIAGNOSIS — D485 Neoplasm of uncertain behavior of skin: Secondary | ICD-10-CM | POA: Diagnosis not present

## 2013-04-09 DIAGNOSIS — L570 Actinic keratosis: Secondary | ICD-10-CM | POA: Diagnosis not present

## 2013-04-09 DIAGNOSIS — Z85828 Personal history of other malignant neoplasm of skin: Secondary | ICD-10-CM | POA: Diagnosis not present

## 2013-04-09 DIAGNOSIS — C44319 Basal cell carcinoma of skin of other parts of face: Secondary | ICD-10-CM | POA: Diagnosis not present

## 2013-04-26 DIAGNOSIS — M19019 Primary osteoarthritis, unspecified shoulder: Secondary | ICD-10-CM | POA: Diagnosis not present

## 2013-04-26 DIAGNOSIS — B0222 Postherpetic trigeminal neuralgia: Secondary | ICD-10-CM | POA: Diagnosis not present

## 2013-04-26 DIAGNOSIS — M1710 Unilateral primary osteoarthritis, unspecified knee: Secondary | ICD-10-CM | POA: Diagnosis not present

## 2013-04-26 DIAGNOSIS — IMO0002 Reserved for concepts with insufficient information to code with codable children: Secondary | ICD-10-CM | POA: Diagnosis not present

## 2013-04-26 DIAGNOSIS — G894 Chronic pain syndrome: Secondary | ICD-10-CM | POA: Diagnosis not present

## 2013-06-01 ENCOUNTER — Ambulatory Visit: Payer: Medicare Other | Admitting: Cardiology

## 2013-06-07 DIAGNOSIS — G894 Chronic pain syndrome: Secondary | ICD-10-CM | POA: Diagnosis not present

## 2013-06-07 DIAGNOSIS — G5690 Unspecified mononeuropathy of unspecified upper limb: Secondary | ICD-10-CM | POA: Diagnosis not present

## 2013-06-07 DIAGNOSIS — G500 Trigeminal neuralgia: Secondary | ICD-10-CM | POA: Diagnosis not present

## 2013-06-13 ENCOUNTER — Other Ambulatory Visit: Payer: Self-pay

## 2013-06-13 DIAGNOSIS — I119 Hypertensive heart disease without heart failure: Secondary | ICD-10-CM

## 2013-06-13 MED ORDER — LOSARTAN POTASSIUM-HCTZ 100-25 MG PO TABS: 1.0000 | ORAL_TABLET | Freq: Every day | ORAL | Status: DC

## 2013-06-13 MED ORDER — EZETIMIBE 10 MG PO TABS: 10.0000 mg | ORAL_TABLET | Freq: Every day | ORAL | Status: DC

## 2013-06-21 ENCOUNTER — Other Ambulatory Visit: Payer: Self-pay

## 2013-06-21 DIAGNOSIS — I119 Hypertensive heart disease without heart failure: Secondary | ICD-10-CM

## 2013-06-21 MED ORDER — AMLODIPINE BESYLATE 2.5 MG PO TABS: 2.5000 mg | ORAL_TABLET | Freq: Every day | ORAL | Status: DC

## 2013-06-27 ENCOUNTER — Telehealth: Payer: Self-pay | Admitting: Cardiology

## 2013-06-27 NOTE — Telephone Encounter (Signed)
Okay to stop Zetia and just continue his statin Crestor.

## 2013-06-27 NOTE — Telephone Encounter (Signed)
New Message  Pt daughter called requests a call back to discuss alternative medications for Zetia. Zetia is considered a tier 3 at $300 per refill. The alternatives suggested was Lovastatin or Pravastatin. Please call back to discuss.

## 2013-06-27 NOTE — Telephone Encounter (Signed)
[name] pt's daughter is aware to stop Zetia and continue with his statin Crestor. [name] verbalized understanding.

## 2013-06-27 NOTE — Telephone Encounter (Signed)
Michael Blanchard pt's daughter called she said that pt will start to pay closed to $300 dollars for a 90 day refill of Zetia 10 mg daily. Michael Blanchard said that she knows her father; and  when he gets to that; pt will stop taking this medication. Michael Blanchard states that the pharmacist suggested a tear 1 medication:  Lovastatin or Pravastatin, and pt did not have a co-pay. Michael Blanchard would like for Dr. Mare Ferrari to prescribe one of these medications for pt. Michael Blanchard is aware that I will send this message to Dr. Mare Ferrari for recommendations.

## 2013-07-17 DIAGNOSIS — G8929 Other chronic pain: Secondary | ICD-10-CM | POA: Diagnosis not present

## 2013-07-17 DIAGNOSIS — E7800 Pure hypercholesterolemia, unspecified: Secondary | ICD-10-CM | POA: Diagnosis not present

## 2013-07-17 DIAGNOSIS — R350 Frequency of micturition: Secondary | ICD-10-CM | POA: Diagnosis not present

## 2013-07-17 DIAGNOSIS — I10 Essential (primary) hypertension: Secondary | ICD-10-CM | POA: Diagnosis not present

## 2013-07-19 DIAGNOSIS — G894 Chronic pain syndrome: Secondary | ICD-10-CM | POA: Diagnosis not present

## 2013-07-19 DIAGNOSIS — IMO0001 Reserved for inherently not codable concepts without codable children: Secondary | ICD-10-CM | POA: Diagnosis not present

## 2013-07-19 DIAGNOSIS — M169 Osteoarthritis of hip, unspecified: Secondary | ICD-10-CM | POA: Diagnosis not present

## 2013-07-19 DIAGNOSIS — M1710 Unilateral primary osteoarthritis, unspecified knee: Secondary | ICD-10-CM | POA: Diagnosis not present

## 2013-07-19 DIAGNOSIS — M1610 Unilateral primary osteoarthritis, unspecified hip: Secondary | ICD-10-CM | POA: Diagnosis not present

## 2013-07-20 ENCOUNTER — Other Ambulatory Visit: Payer: Self-pay | Admitting: Dermatology

## 2013-07-20 DIAGNOSIS — D485 Neoplasm of uncertain behavior of skin: Secondary | ICD-10-CM | POA: Diagnosis not present

## 2013-07-20 DIAGNOSIS — Z85828 Personal history of other malignant neoplasm of skin: Secondary | ICD-10-CM | POA: Diagnosis not present

## 2013-07-20 DIAGNOSIS — L570 Actinic keratosis: Secondary | ICD-10-CM | POA: Diagnosis not present

## 2013-07-20 DIAGNOSIS — C44320 Squamous cell carcinoma of skin of unspecified parts of face: Secondary | ICD-10-CM | POA: Diagnosis not present

## 2013-07-26 DIAGNOSIS — N390 Urinary tract infection, site not specified: Secondary | ICD-10-CM | POA: Diagnosis not present

## 2013-08-03 DIAGNOSIS — N3941 Urge incontinence: Secondary | ICD-10-CM | POA: Diagnosis not present

## 2013-08-03 DIAGNOSIS — R350 Frequency of micturition: Secondary | ICD-10-CM | POA: Diagnosis not present

## 2013-08-03 DIAGNOSIS — R351 Nocturia: Secondary | ICD-10-CM | POA: Diagnosis not present

## 2013-08-30 DIAGNOSIS — G894 Chronic pain syndrome: Secondary | ICD-10-CM | POA: Diagnosis not present

## 2013-08-30 DIAGNOSIS — M1710 Unilateral primary osteoarthritis, unspecified knee: Secondary | ICD-10-CM | POA: Diagnosis not present

## 2013-08-30 DIAGNOSIS — G500 Trigeminal neuralgia: Secondary | ICD-10-CM | POA: Diagnosis not present

## 2013-08-30 DIAGNOSIS — Z79899 Other long term (current) drug therapy: Secondary | ICD-10-CM | POA: Diagnosis not present

## 2013-09-11 ENCOUNTER — Telehealth: Payer: Self-pay | Admitting: Neurology

## 2013-09-11 DIAGNOSIS — G500 Trigeminal neuralgia: Secondary | ICD-10-CM

## 2013-09-11 MED ORDER — CARBAMAZEPINE 200 MG PO TABS: 200.0000 mg | ORAL_TABLET | Freq: Two times a day (BID) | ORAL | Status: DC

## 2013-09-11 NOTE — Telephone Encounter (Signed)
Patient's daughter [name] calling to state that patient has had to take his carbamazepine 2 times a day and now he needs a refill. [name] states that if he doesn't take his medication 2 times a day he cannot touch his eye without setting off the pain. Please return call to patient's daughter and advise.

## 2013-09-11 NOTE — Telephone Encounter (Signed)
Patients daughter indicates the patient had pain, so they decided on their own to increase Carbamazepine from one tab daily to one tab twice daily.  They would like the Rx updated to refect these directions so they can get a refill.  Okay to change?  Please advise.  Thank you.

## 2013-09-11 NOTE — Telephone Encounter (Signed)
I have called his daughter, his father Mr. Hatfield is having some increased facial pain, doing well taking Carbamazepine 200mg  bid.  Renewed his Rx.200mg  bid 90 days supple

## 2013-09-19 DIAGNOSIS — N3941 Urge incontinence: Secondary | ICD-10-CM | POA: Diagnosis not present

## 2013-09-19 DIAGNOSIS — R350 Frequency of micturition: Secondary | ICD-10-CM | POA: Diagnosis not present

## 2013-09-20 DIAGNOSIS — H6120 Impacted cerumen, unspecified ear: Secondary | ICD-10-CM | POA: Diagnosis not present

## 2013-09-20 DIAGNOSIS — H905 Unspecified sensorineural hearing loss: Secondary | ICD-10-CM | POA: Diagnosis not present

## 2013-09-20 DIAGNOSIS — H9190 Unspecified hearing loss, unspecified ear: Secondary | ICD-10-CM | POA: Diagnosis not present

## 2013-10-05 ENCOUNTER — Other Ambulatory Visit: Payer: Self-pay

## 2013-10-05 DIAGNOSIS — I119 Hypertensive heart disease without heart failure: Secondary | ICD-10-CM

## 2013-10-05 MED ORDER — AMLODIPINE BESYLATE 2.5 MG PO TABS: 2.5000 mg | ORAL_TABLET | Freq: Every day | ORAL | Status: DC

## 2013-10-10 ENCOUNTER — Other Ambulatory Visit: Payer: Self-pay

## 2013-10-10 MED ORDER — METOPROLOL TARTRATE 50 MG PO TABS: 50.0000 mg | ORAL_TABLET | Freq: Two times a day (BID) | ORAL | Status: DC

## 2013-10-25 DIAGNOSIS — M1710 Unilateral primary osteoarthritis, unspecified knee: Secondary | ICD-10-CM | POA: Diagnosis not present

## 2013-10-25 DIAGNOSIS — M1990 Unspecified osteoarthritis, unspecified site: Secondary | ICD-10-CM | POA: Diagnosis not present

## 2013-10-25 DIAGNOSIS — G894 Chronic pain syndrome: Secondary | ICD-10-CM | POA: Diagnosis not present

## 2013-10-29 DIAGNOSIS — M1710 Unilateral primary osteoarthritis, unspecified knee: Secondary | ICD-10-CM | POA: Diagnosis not present

## 2013-10-29 DIAGNOSIS — M25519 Pain in unspecified shoulder: Secondary | ICD-10-CM | POA: Diagnosis not present

## 2013-10-29 DIAGNOSIS — M75120 Complete rotator cuff tear or rupture of unspecified shoulder, not specified as traumatic: Secondary | ICD-10-CM | POA: Diagnosis not present

## 2013-10-31 ENCOUNTER — Telehealth: Payer: Self-pay | Admitting: Neurology

## 2013-10-31 MED ORDER — DONEPEZIL HCL 10 MG PO TABS: 10.0000 mg | ORAL_TABLET | Freq: Every day | ORAL | Status: DC

## 2013-10-31 NOTE — Telephone Encounter (Signed)
Patient's daughter called saying the patient was previously prescribed Donepezil 10mg  from Dr Vira Blanco, however, they will no longer provide this Rx, as they feel neurologist should prescribe it.  She would like to know if Dr Krista Blue would be able to start filling it instead.   Dr Krista Blue is out of the office.  Forwarding request to [facility] for review.  Please advise.  Thank you.

## 2013-10-31 NOTE — Telephone Encounter (Signed)
I called patient, talked with the daughter. The patient has been placed on Aricept by her primary care doctor, and this has helped his memory. They wish to continue the medication, I will call in a 90 day supply to [facility].

## 2013-10-31 NOTE — Telephone Encounter (Signed)
Michael Blanchard DOB 07/25/2025--patient's daughter [name] is calling--Patient had previously seen Dr. Vira Blanco and he had prescribed Donepezil 10mg  for his memory--has been on the medication 3-4 months and is working--he needs a new Rx called in because Dr. Vira Blanco says since he is seeing a neurologist we should call Rx in--[facility]-Fax#-[phone]--thank you.

## 2013-11-20 DIAGNOSIS — R413 Other amnesia: Secondary | ICD-10-CM | POA: Diagnosis not present

## 2013-11-20 DIAGNOSIS — G894 Chronic pain syndrome: Secondary | ICD-10-CM | POA: Diagnosis not present

## 2013-11-20 DIAGNOSIS — I10 Essential (primary) hypertension: Secondary | ICD-10-CM | POA: Diagnosis not present

## 2013-11-20 DIAGNOSIS — E7800 Pure hypercholesterolemia, unspecified: Secondary | ICD-10-CM | POA: Diagnosis not present

## 2013-11-20 DIAGNOSIS — Z23 Encounter for immunization: Secondary | ICD-10-CM | POA: Diagnosis not present

## 2013-11-20 DIAGNOSIS — N183 Chronic kidney disease, stage 3 unspecified: Secondary | ICD-10-CM | POA: Diagnosis not present

## 2013-11-21 DIAGNOSIS — Z79899 Other long term (current) drug therapy: Secondary | ICD-10-CM | POA: Diagnosis not present

## 2013-11-21 DIAGNOSIS — G894 Chronic pain syndrome: Secondary | ICD-10-CM | POA: Diagnosis not present

## 2013-11-21 DIAGNOSIS — M1710 Unilateral primary osteoarthritis, unspecified knee: Secondary | ICD-10-CM | POA: Diagnosis not present

## 2013-11-21 DIAGNOSIS — IMO0002 Reserved for concepts with insufficient information to code with codable children: Secondary | ICD-10-CM | POA: Diagnosis not present

## 2013-11-21 DIAGNOSIS — M1990 Unspecified osteoarthritis, unspecified site: Secondary | ICD-10-CM | POA: Diagnosis not present

## 2013-11-23 DIAGNOSIS — Z85828 Personal history of other malignant neoplasm of skin: Secondary | ICD-10-CM | POA: Diagnosis not present

## 2013-11-23 DIAGNOSIS — D692 Other nonthrombocytopenic purpura: Secondary | ICD-10-CM | POA: Diagnosis not present

## 2013-11-23 DIAGNOSIS — L570 Actinic keratosis: Secondary | ICD-10-CM | POA: Diagnosis not present

## 2013-11-23 DIAGNOSIS — L821 Other seborrheic keratosis: Secondary | ICD-10-CM | POA: Diagnosis not present

## 2013-11-27 ENCOUNTER — Other Ambulatory Visit: Payer: Self-pay | Admitting: *Deleted

## 2013-11-27 DIAGNOSIS — I119 Hypertensive heart disease without heart failure: Secondary | ICD-10-CM

## 2013-11-27 MED ORDER — LOSARTAN POTASSIUM-HCTZ 100-25 MG PO TABS: 1.0000 | ORAL_TABLET | Freq: Every day | ORAL | Status: DC

## 2013-12-28 NOTE — Telephone Encounter (Signed)
Nothing was typed/tmj 

## 2014-01-22 DIAGNOSIS — H02831 Dermatochalasis of right upper eyelid: Secondary | ICD-10-CM | POA: Diagnosis not present

## 2014-01-22 DIAGNOSIS — H04123 Dry eye syndrome of bilateral lacrimal glands: Secondary | ICD-10-CM | POA: Diagnosis not present

## 2014-01-22 DIAGNOSIS — Z961 Presence of intraocular lens: Secondary | ICD-10-CM | POA: Diagnosis not present

## 2014-01-22 DIAGNOSIS — H02834 Dermatochalasis of left upper eyelid: Secondary | ICD-10-CM | POA: Diagnosis not present

## 2014-01-22 DIAGNOSIS — H26491 Other secondary cataract, right eye: Secondary | ICD-10-CM | POA: Diagnosis not present

## 2014-01-28 DIAGNOSIS — M170 Bilateral primary osteoarthritis of knee: Secondary | ICD-10-CM | POA: Diagnosis not present

## 2014-01-29 ENCOUNTER — Ambulatory Visit (INDEPENDENT_AMBULATORY_CARE_PROVIDER_SITE_OTHER): Payer: Medicare Other | Admitting: Cardiology

## 2014-01-29 ENCOUNTER — Encounter: Payer: Self-pay | Admitting: Cardiology

## 2014-01-29 VITALS — BP 146/64 | HR 83 | Ht 69.0 in | Wt 179.1 lb

## 2014-01-29 DIAGNOSIS — Z953 Presence of xenogenic heart valve: Secondary | ICD-10-CM

## 2014-01-29 DIAGNOSIS — Z954 Presence of other heart-valve replacement: Secondary | ICD-10-CM

## 2014-01-29 DIAGNOSIS — M150 Primary generalized (osteo)arthritis: Secondary | ICD-10-CM | POA: Diagnosis not present

## 2014-01-29 DIAGNOSIS — I119 Hypertensive heart disease without heart failure: Secondary | ICD-10-CM

## 2014-01-29 DIAGNOSIS — Z951 Presence of aortocoronary bypass graft: Secondary | ICD-10-CM

## 2014-01-29 DIAGNOSIS — E7800 Pure hypercholesterolemia, unspecified: Secondary | ICD-10-CM

## 2014-01-29 DIAGNOSIS — M159 Polyosteoarthritis, unspecified: Secondary | ICD-10-CM

## 2014-01-29 MED ORDER — AMLODIPINE BESYLATE 2.5 MG PO TABS: 2.5000 mg | ORAL_TABLET | Freq: Every day | ORAL | Status: DC

## 2014-01-29 MED ORDER — METOPROLOL TARTRATE 50 MG PO TABS: 50.0000 mg | ORAL_TABLET | Freq: Two times a day (BID) | ORAL | Status: DC

## 2014-01-29 MED ORDER — LOSARTAN POTASSIUM-HCTZ 100-25 MG PO TABS: 1.0000 | ORAL_TABLET | Freq: Every day | ORAL | Status: DC

## 2014-01-29 NOTE — Assessment & Plan Note (Signed)
His valve continues to work well.  No evidence of CHF

## 2014-01-29 NOTE — Assessment & Plan Note (Signed)
The patient has had no angina pectoris.  He carries sublingual nitroglycerin but has not had to use it

## 2014-01-29 NOTE — Assessment & Plan Note (Signed)
No headaches.  No dizzy spells.  No symptoms of CHF.

## 2014-01-29 NOTE — Progress Notes (Signed)
Michael Blanchard Date of Birth:  10-07-1925 [place] [address] [place], [place]  [address] [phone]        Fax   [phone]   History of Present Illness: This pleasant 80 year old gentleman is seen for a scheduled followup office visit. He has a history of ischemic heart disease and valvular heart disease. He had coronary artery bypass graft surgery as well as aortic valve replacement using a tissue valve in 2001. He has done well postoperatively. He had a nuclear stress test in 2007 showing no evidence of ischemia. His ejection fraction was 59%. He had an echocardiogram in February 2010 showing normal LV systolic function with impaired relaxation and showed normal tissue aortic valve replacement with no aortic insufficiency. He has a history of high blood pressure and history of hypercholesterolemia. Since last visit he has been doing well except for chronic arthritis of both of his knees.he has been getting steroid shots in his knees at intervals by Dr. Jean Rosenthal. His primary care provider is Dr. Delfina Redwood.  Current Outpatient Prescriptions  Medication Sig Dispense Refill  . Amantadine HCl 100 MG tablet Take 100 mg by mouth daily.    Marland Kitchen amLODipine (NORVASC) 2.5 MG tablet Take 1 tablet (2.5 mg total) by mouth daily. 90 tablet 1  . Calcium Carbonate-Vitamin D (CALCIUM 600 + D PO) Take 1 tablet by mouth daily.    . carbamazepine (TEGRETOL) 200 MG tablet Take 1 tablet (200 mg total) by mouth 2 (two) times daily. 180 tablet 3  . clopidogrel (PLAVIX) 75 MG tablet Take 1 tablet (75 mg total) by mouth daily. 90 tablet 3  . CRANBERRY PO Take by mouth. Taking  2000 daily     . donepezil (ARICEPT) 10 MG tablet Take 10 mg by mouth at bedtime.    . donepezil (ARICEPT) 10 MG tablet Take 1 tablet (10 mg total) by mouth at bedtime. 90 tablet 3  . HYDROcodone-acetaminophen (LORTAB) 10-500 MG per tablet Take 1 tablet by mouth every 6 (six) hours as needed. 300 tablet 1   . losartan-hydrochlorothiazide (HYZAAR) 100-25 MG per tablet Take 1 tablet by mouth daily. 90 tablet 1  . metoprolol (LOPRESSOR) 50 MG tablet Take 1 tablet (50 mg total) by mouth 2 (two) times daily. 180 tablet 1  . Misc Natural Products (OSTEO BI-FLEX JOINT SHIELD PO) Take by mouth 2 (two) times daily.    . Multiple Vitamin (MULTIVITAMIN) tablet Take 1 tablet by mouth daily.      Marland Kitchen oxyCODONE-acetaminophen (PERCOCET/ROXICET) 5-325 MG per tablet Take 325 tablets by mouth daily.    . rosuvastatin (CRESTOR) 10 MG tablet Take 5 mg by mouth every other day.      . tamsulosin (FLOMAX) 0.4 MG CAPS capsule   11   No current facility-administered medications for this visit.    Allergies  Allergen Reactions  . Gabapentin   . Lipitor [Atorvastatin Calcium]   . Niaspan [Niacin]     Patient Active Problem List   Diagnosis Date Noted  . Trigeminal neuralgia 03/21/2013  . Malaise and fatigue 01/25/2011  . Benign hypertensive heart disease without heart failure 09/24/2010  . Hx of CABG 09/24/2010  . S/P aortic valve replacement with bioprosthetic valve 09/24/2010  . Hypercholesterolemia 09/24/2010  . Osteoarthritis 09/24/2010    History  Smoking status  . Former Smoker  . Quit date: 09/13/1985  Smokeless tobacco  . Never Used    History  Alcohol Use No  Family History  Problem Relation Age of Onset  . Stroke Mother   . Aneurysm Mother   . Aneurysm Father   . Heart disease Brother     Review of Systems: Constitutional: no fever chills diaphoresis or fatigue or change in weight.  Head and neck: no hearing loss, no epistaxis, no photophobia or visual disturbance. Respiratory: No cough, shortness of breath or wheezing. Cardiovascular: No chest pain peripheral edema, palpitations. Gastrointestinal: No abdominal distention, no abdominal pain, no change in bowel habits hematochezia or melena. Genitourinary: No dysuria, no frequency, no urgency, no nocturia. Musculoskeletal:No  arthralgias, no back pain, no gait disturbance or myalgias. Neurological: No dizziness, no headaches, no numbness, no seizures, no syncope, no weakness, no tremors. Hematologic: No lymphadenopathy, no easy bruising. Psychiatric: No confusion, no hallucinations, no sleep disturbance.    Physical Exam: Filed Vitals:   01/29/14 1609  BP: 146/64  Pulse: 83  The patient appears to be in no distress.  Head and neck exam reveals that the pupils are equal and reactive.  The extraocular movements are full.  There is no scleral icterus.  Mouth and pharynx are benign.  No lymphadenopathy.  No carotid bruits.  The jugular venous pressure is normal.  Thyroid is not enlarged or tender.  Chest is clear to percussion and auscultation.  No rales or rhonchi.  Expansion of the chest is symmetrical.  Heart reveals no abnormal lift or heave.  First and second heart sounds are normal.  There is grade 2/6 systolic ejection murmur across the prosthetic aortic valve.  No diastolic murmur.  No gallop or rub.  The abdomen is soft and nontender.  Bowel sounds are normoactive.  There is no hepatosplenomegaly or mass.  There are no abdominal bruits.  Extremities reveal no phlebitis.  there is trace edema.  Pedal pulses are good.  There is no cyanosis or clubbing.  Neurologic exam is normal strength and no lateralizing weakness.  No sensory deficits.  Integument reveals no rash  EKG today shows normal sinus rhythm with first-degree AV block.  Since 01/24/13, the previous anterolateral T wave inversions have improved.  Assessment / Plan: 1.ischemic heart disease status post CABG in 2001 2.  Valvular heart disease with prior severe aortic stenosis and history of aortic valve replacement using a tissue valve in 2001 3.  Essential hypertension without heart failure 4.  Hypercholesterolemia followed by his primary care provider Dr. Delfina Redwood 5. Osteoarthritis of both knees  Disposition: Continue current medication.   Recheck in 6 months for office visit

## 2014-01-29 NOTE — Assessment & Plan Note (Signed)
The steroid shots which she has received into his knees have helped considerably with his pain and mobility issues

## 2014-01-29 NOTE — Patient Instructions (Signed)
Your physician recommends that you continue on your current medications as directed. Please refer to the Current Medication list given to you today.  Your physician wants you to follow-up in: 6 months with Dr. Mare Ferrari.  You will receive a reminder letter in the mail two months in advance. If you don't receive a letter, please call our office to schedule the follow-up appointment.

## 2014-02-13 DIAGNOSIS — M791 Myalgia: Secondary | ICD-10-CM | POA: Diagnosis not present

## 2014-02-13 DIAGNOSIS — M1990 Unspecified osteoarthritis, unspecified site: Secondary | ICD-10-CM | POA: Diagnosis not present

## 2014-02-13 DIAGNOSIS — G5790 Unspecified mononeuropathy of unspecified lower limb: Secondary | ICD-10-CM | POA: Diagnosis not present

## 2014-02-13 DIAGNOSIS — M179 Osteoarthritis of knee, unspecified: Secondary | ICD-10-CM | POA: Diagnosis not present

## 2014-02-13 DIAGNOSIS — G609 Hereditary and idiopathic neuropathy, unspecified: Secondary | ICD-10-CM | POA: Diagnosis not present

## 2014-02-13 DIAGNOSIS — G894 Chronic pain syndrome: Secondary | ICD-10-CM | POA: Diagnosis not present

## 2014-03-21 ENCOUNTER — Encounter: Payer: Self-pay | Admitting: Neurology

## 2014-03-21 ENCOUNTER — Ambulatory Visit (INDEPENDENT_AMBULATORY_CARE_PROVIDER_SITE_OTHER): Payer: Medicare Other | Admitting: Neurology

## 2014-03-21 ENCOUNTER — Telehealth: Payer: Self-pay | Admitting: Neurology

## 2014-03-21 VITALS — BP 117/51 | HR 57 | Ht 69.0 in | Wt 181.0 lb

## 2014-03-21 DIAGNOSIS — G500 Trigeminal neuralgia: Secondary | ICD-10-CM

## 2014-03-21 DIAGNOSIS — I119 Hypertensive heart disease without heart failure: Secondary | ICD-10-CM

## 2014-03-21 MED ORDER — CARBAMAZEPINE 200 MG PO TABS: 200.0000 mg | ORAL_TABLET | Freq: Two times a day (BID) | ORAL | Status: DC

## 2014-03-21 MED ORDER — DICLOFENAC EPOLAMINE 1.3 % TD PTCH: 1.0000 | MEDICATED_PATCH | Freq: Two times a day (BID) | TRANSDERMAL | Status: DC

## 2014-03-21 MED ORDER — CLOPIDOGREL BISULFATE 75 MG PO TABS: 75.0000 mg | ORAL_TABLET | Freq: Every day | ORAL | Status: DC

## 2014-03-21 NOTE — Telephone Encounter (Signed)
I called the pharmacy.  The number provided was for member services, they were not ale to assist me and asked that I call [phone].  I called this number and spoke with Juanita.  She viewed the patient's file and verified they did receive Rx's without any issues.

## 2014-03-21 NOTE — Telephone Encounter (Signed)
Michael Blanchard, please call his pharmacy, [facility] to make sure that His Rx is sent in correctly  [phone]

## 2014-03-21 NOTE — Progress Notes (Signed)
Subjective:    Patient ID: Michael Blanchard is a 81 y.o. male.  Michael Blanchard is a very pleasant 58 -year-old right-handed gentleman who presents for followup consultation of his memory loss, trigeminal neuralgia, and prior history of TIA.  He has past medical history of hypertension, hyperlipidemia, heart disease, status post aortic valve replacement and five-vessel CABG 25 years ago, trigeminal neuralgia, arthritis and memory loss.   He had a history of trigeminal neuralgia for more than 2 decades. He was first treated with gabapentin for this. He was then placed on carbamazepine. He started having memory problems.  In April 2013 he had a TIA with weakness and fall. He was on a baby aspirin daily and Plavix was added. 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 There was kinking of the left ICA. MRA brain showed diffuse atherosclerotic disease with severe narrowing of the bilateral internal carotid arteries at the siphon and moderate to severe stenosis of the inferior basilar artery and focal stenosis of the right PCA.   MRI brain showed mild diffuse and moderate mesial temporal atrophy and chronic left cerebellar ischemic infarct.  In January 2014 his MMSE was 28, clock drawing was 4, animal fluency was 15.  He states his trigeminal neuralgia is stable. He is taking carbamazepine 200 mg daily, he still has occasionally left upper eyelid, left nasal bridge shooting pain, he is also on frequent oxycodone, hydrocodone for his chronic bilateral knee pain, and low back pain, he is happy about current pain control,   Last Tegretol level was 3.4 in July 2014  UPDATE Jan 7th 2016: He drove himself to office today, he lives with his wife, with the help of his children, he has mild gait difficulty due to joints pain, he only has occasionally recurrent left trigeminal facial pain, doing well on current cup as appearing 200 mg twice a day, does not want to make any change, also need refill of his  Plavix.   Review of system: As above  Past Medical History Is Significant For: Past Medical History  Diagnosis Date  . Hyperlipidemia   . Hypertension   . AVD (aortic valve disease)   . Coronary atherosclerosis   . Fatigue   . Trigeminal neuralgia     His Past Surgical History Is Significant For: Past Surgical History  Procedure Laterality Date  . Cardiac catheterization  06/22/1999    SEVERE HYPOKINESIA OF THE MID TO DISTAL INFERIOR WALL AND APEX.MODERATE LEFT VENTRICULAR DYSFUNCTION WITH EF 45%  . Aortic valve replacement    . Coronary artery bypass graft  2001    His Family History Is Significant For: Family History  Problem Relation Age of Onset  . Stroke Mother   . Aneurysm Mother   . Aneurysm Father   . Heart disease Brother     His Social History Is Significant For: History   Social History  . Marital Status: Married    Spouse Name: Murray Hodgkins    Number of Children: 4  . Years of Education: 12   Occupational History  .      retired   Social History Main Topics  . Smoking status: Former Smoker    Quit date: 09/13/1985  . Smokeless tobacco: Never Used  . Alcohol Use: No  . Drug Use: No  . Sexual Activity: None   Other Topics Concern  . None   Social History Narrative   Pt lives at home with his spouse. [name]) [name]   Caffeine Use: - Two  cups of coffee daily and one pepsi daily.   Right handed.   Education- High school.    His Allergies Are:  Allergies  Allergen Reactions  . Gabapentin   . Lipitor [Atorvastatin Calcium]   . Niaspan [Niacin]   :   His Current Medications Are:  Outpatient Encounter Prescriptions as of 03/21/2014  Medication Sig  . Amantadine HCl 100 MG tablet Take 100 mg by mouth daily.  Marland Kitchen amLODipine (NORVASC) 2.5 MG tablet Take 1 tablet (2.5 mg total) by mouth daily.  . Calcium Carbonate-Vitamin D (CALCIUM 600 + D PO) Take 1 tablet by mouth daily.  . carbamazepine (TEGRETOL) 200 MG tablet Take 1 tablet (200 mg total) by mouth  2 (two) times daily.  . clopidogrel (PLAVIX) 75 MG tablet Take 1 tablet (75 mg total) by mouth daily.  Marland Kitchen CRANBERRY PO Take by mouth. Taking  2000 daily   . donepezil (ARICEPT) 10 MG tablet Take 10 mg by mouth at bedtime.  . donepezil (ARICEPT) 10 MG tablet Take 1 tablet (10 mg total) by mouth at bedtime.  Marland Kitchen HYDROcodone-acetaminophen (LORTAB) 10-500 MG per tablet Take 1 tablet by mouth every 6 (six) hours as needed.  Marland Kitchen losartan-hydrochlorothiazide (HYZAAR) 100-25 MG per tablet Take 1 tablet by mouth daily.  . metoprolol (LOPRESSOR) 50 MG tablet Take 1 tablet (50 mg total) by mouth 2 (two) times daily.  . Misc Natural Products (OSTEO BI-FLEX JOINT SHIELD PO) Take by mouth 2 (two) times daily.  . Multiple Vitamin (MULTIVITAMIN) tablet Take 1 tablet by mouth daily.    Marland Kitchen NITROSTAT 0.4 MG SL tablet   . oxyCODONE-acetaminophen (PERCOCET/ROXICET) 5-325 MG per tablet Take 325 tablets by mouth daily.  . rosuvastatin (CRESTOR) 10 MG tablet Take 5 mg by mouth every other day.    . tamsulosin (FLOMAX) 0.4 MG CAPS capsule    Review of Systems Is positive for bilateral knee pain, gait difficulty,   PHYSICAL EXAMINATOINS:  Generalized: In no acute distress  Neck: Supple, no carotid bruits   Cardiac: Regular rate rhythm  Pulmonary: Clear to auscultation bilaterally  Musculoskeletal: No deformity  Neurological examination  Mentation: Alert oriented to time, place, history taking, and causual conversation, MMSE 30/30  Cranial nerve II-XII: Pupils were equal round reactive to light extraocular movements were full, visual field were full on confrontational test.  Bilateral fundi were sharp  Facial sensation and strength were normal. hearing was intact to finger rubbing bilaterally. Uvula tongue midline.  head turning and shoulder shrug and were normal and symmetric.Tongue protrusion into cheek strength was normal.  Motor: normal tone, bulk and strength.  Sensory: Intact to fine touch    Coordination: Normal finger to nose, heel-to-shin bilaterally there was no truncal ataxia  Gait: atalgic, cautious gait.  Deep tendon reflexes: Brachioradialis 1/1, biceps 1/1, triceps 1/1, patellar absent, Achilles absent, plantar responses were flexor bilaterally.  Assessment and Plan:  81 years old Caucasian male, with past medical history of left trigeminal neuralgia, doing very well, with his current dose of carbamazepine, 200 mg twice daily,, he is also has multiple vascular risk factors, on  Plavix, and refill his Plavix, and  carbamazepine, a year supply, return to clinic in one year,  Marcial Pacas, [name] Ph.D.  [facility] [place], [place] [address] Phone: [phone] Fax:      [phone]

## 2014-04-22 DIAGNOSIS — M25511 Pain in right shoulder: Secondary | ICD-10-CM | POA: Diagnosis not present

## 2014-04-22 DIAGNOSIS — M170 Bilateral primary osteoarthritis of knee: Secondary | ICD-10-CM | POA: Diagnosis not present

## 2014-05-09 DIAGNOSIS — E780 Pure hypercholesterolemia: Secondary | ICD-10-CM | POA: Diagnosis not present

## 2014-05-09 DIAGNOSIS — I10 Essential (primary) hypertension: Secondary | ICD-10-CM | POA: Diagnosis not present

## 2014-05-09 DIAGNOSIS — G500 Trigeminal neuralgia: Secondary | ICD-10-CM | POA: Diagnosis not present

## 2014-05-09 DIAGNOSIS — M79673 Pain in unspecified foot: Secondary | ICD-10-CM | POA: Diagnosis not present

## 2014-05-09 DIAGNOSIS — H9190 Unspecified hearing loss, unspecified ear: Secondary | ICD-10-CM | POA: Diagnosis not present

## 2014-05-09 DIAGNOSIS — I25810 Atherosclerosis of coronary artery bypass graft(s) without angina pectoris: Secondary | ICD-10-CM | POA: Diagnosis not present

## 2014-05-09 DIAGNOSIS — G8929 Other chronic pain: Secondary | ICD-10-CM | POA: Diagnosis not present

## 2014-05-30 DIAGNOSIS — L821 Other seborrheic keratosis: Secondary | ICD-10-CM | POA: Diagnosis not present

## 2014-05-30 DIAGNOSIS — Z85828 Personal history of other malignant neoplasm of skin: Secondary | ICD-10-CM | POA: Diagnosis not present

## 2014-05-30 DIAGNOSIS — L570 Actinic keratosis: Secondary | ICD-10-CM | POA: Diagnosis not present

## 2014-05-30 DIAGNOSIS — D485 Neoplasm of uncertain behavior of skin: Secondary | ICD-10-CM | POA: Diagnosis not present

## 2014-05-30 DIAGNOSIS — L720 Epidermal cyst: Secondary | ICD-10-CM | POA: Diagnosis not present

## 2014-07-15 ENCOUNTER — Other Ambulatory Visit: Payer: Self-pay | Admitting: Cardiology

## 2014-07-22 DIAGNOSIS — M25511 Pain in right shoulder: Secondary | ICD-10-CM | POA: Diagnosis not present

## 2014-07-22 DIAGNOSIS — M170 Bilateral primary osteoarthritis of knee: Secondary | ICD-10-CM | POA: Diagnosis not present

## 2014-07-25 DIAGNOSIS — R350 Frequency of micturition: Secondary | ICD-10-CM | POA: Diagnosis not present

## 2014-07-25 DIAGNOSIS — R351 Nocturia: Secondary | ICD-10-CM | POA: Diagnosis not present

## 2014-07-29 ENCOUNTER — Other Ambulatory Visit: Payer: Self-pay | Admitting: Cardiology

## 2014-07-30 ENCOUNTER — Ambulatory Visit (INDEPENDENT_AMBULATORY_CARE_PROVIDER_SITE_OTHER): Payer: Medicare Other | Admitting: Cardiology

## 2014-07-30 ENCOUNTER — Encounter: Payer: Self-pay | Admitting: Cardiology

## 2014-07-30 VITALS — BP 140/50 | HR 57 | Ht 69.0 in | Wt 173.8 lb

## 2014-07-30 DIAGNOSIS — M150 Primary generalized (osteo)arthritis: Secondary | ICD-10-CM | POA: Diagnosis not present

## 2014-07-30 DIAGNOSIS — E7800 Pure hypercholesterolemia, unspecified: Secondary | ICD-10-CM

## 2014-07-30 DIAGNOSIS — Z954 Presence of other heart-valve replacement: Secondary | ICD-10-CM | POA: Diagnosis not present

## 2014-07-30 DIAGNOSIS — I119 Hypertensive heart disease without heart failure: Secondary | ICD-10-CM

## 2014-07-30 DIAGNOSIS — M159 Polyosteoarthritis, unspecified: Secondary | ICD-10-CM

## 2014-07-30 DIAGNOSIS — Z953 Presence of xenogenic heart valve: Secondary | ICD-10-CM

## 2014-07-30 NOTE — Patient Instructions (Signed)
Medication Instructions:  Your physician recommends that you continue on your current medications as directed. Please refer to the Current Medication list given to you today.  Labwork: none  Testing/Procedures: none  Follow-Up: Your physician wants you to follow-up in: 6 month ov/ekg You will receive a reminder letter in the mail two months in advance. If you don't receive a letter, please call our office to schedule the follow-up appointment.   Any Other Special Instructions Will Be Listed Below (If Applicable).   

## 2014-07-30 NOTE — Progress Notes (Signed)
Cardiology Office Note   Date:  07/30/2014   ID:  Michael Blanchard, DOB January 19, 1926, MRN [id number]  PCP:  Michael Hams, MD  Cardiologist: Michael Coco MD  No chief complaint on file.     History of Present Illness: Michael Blanchard is a 81 y.o. male who presents for a six-month follow-up visit  This pleasant 81 year old gentleman is seen for a scheduled followup office visit. He has a history of ischemic heart disease and valvular heart disease. He had coronary artery bypass graft surgery as well as aortic valve replacement using a tissue valve in 2001. He has done well postoperatively. He had a nuclear stress test in 2007 showing no evidence of ischemia. His ejection fraction was 59%. He had an echocardiogram in February 2010 showing normal LV systolic function with impaired relaxation and showed normal tissue aortic valve replacement with no aortic insufficiency. He has a history of high blood pressure and history of hypercholesterolemia. Since last visit he has been doing well except for chronic arthritis of both of his knees.he has been getting steroid shots in his knees at intervals by Dr. Jean Rosenthal. His primary care provider is Dr. Delfina Blanchard.  For exercise he goes to the gym several times a week and uses an exercise bicycle. Since last visit he has not had any new cardiac symptoms.  No chest pain.  No shortness of breath.  His weight is down 8 pounds.  His appetite is not quite as good.  Past Medical History  Diagnosis Date  . Hyperlipidemia   . Hypertension   . AVD (aortic valve disease)   . Coronary atherosclerosis   . Fatigue   . Trigeminal neuralgia     Past Surgical History  Procedure Laterality Date  . Cardiac catheterization  06/22/1999    SEVERE HYPOKINESIA OF THE MID TO DISTAL INFERIOR WALL AND APEX.MODERATE LEFT VENTRICULAR DYSFUNCTION WITH EF 45%  . Aortic valve replacement    . Coronary artery bypass graft  2001     Current Outpatient  Prescriptions  Medication Sig Dispense Refill  . Amantadine HCl 100 MG tablet Take 100 mg by mouth daily.    Marland Kitchen amLODipine (NORVASC) 2.5 MG tablet TAKE 1 BY MOUTH DAILY 90 tablet 3  . Calcium Carbonate-Vitamin D (CALCIUM 600 + D PO) Take 1 tablet by mouth daily.    . carbamazepine (TEGRETOL) 200 MG tablet Take 1 tablet (200 mg total) by mouth 2 (two) times daily. 180 tablet 3  . clopidogrel (PLAVIX) 75 MG tablet Take 1 tablet (75 mg total) by mouth daily. 100 tablet 3  . CRANBERRY PO Take by mouth. Taking  2000 daily     . diclofenac (FLECTOR) 1.3 % PTCH Place 1 patch onto the skin 2 (two) times daily. 30 patch 6  . donepezil (ARICEPT) 10 MG tablet Take 10 mg by mouth at bedtime.    Marland Kitchen HYDROcodone-acetaminophen (LORTAB) 10-500 MG per tablet Take 1 tablet by mouth every 6 (six) hours as needed. 300 tablet 1  . HYDROcodone-acetaminophen (NORCO) 7.5-325 MG per tablet Take 1 tablet by mouth 2 (two) times daily.  0  . losartan-hydrochlorothiazide (HYZAAR) 100-25 MG per tablet Take 1 tablet by mouth daily. 90 tablet 1  . metoprolol (LOPRESSOR) 50 MG tablet Take 1 tablet (50 mg total) by mouth 2 (two) times daily. 180 tablet 0  . Misc Natural Products (OSTEO BI-FLEX JOINT SHIELD PO) Take by mouth 2 (two) times daily.    . Multiple Vitamin (MULTIVITAMIN) tablet  Take 1 tablet by mouth daily.      Marland Kitchen NITROSTAT 0.4 MG SL tablet   0  . oxyCODONE-acetaminophen (PERCOCET/ROXICET) 5-325 MG per tablet Take 325 tablets by mouth daily.    . rosuvastatin (CRESTOR) 10 MG tablet Take 5 mg by mouth every other day.      . tamsulosin (FLOMAX) 0.4 MG CAPS capsule   11   No current facility-administered medications for this visit.    Allergies:   Gabapentin; Lipitor; and Niaspan    Social History:  The patient  reports that he quit smoking about 28 years ago. He has never used smokeless tobacco. He reports that he does not drink alcohol or use illicit drugs.   Family History:  The patient's family history includes  Aneurysm in his father and mother; Heart disease in his brother; Stroke in his mother.    ROS:  Please see the history of present illness.   Otherwise, review of systems are positive for none.   All other systems are reviewed and negative.    PHYSICAL EXAM: VS:  BP 140/50 mmHg  Pulse 57  Ht 5\' 9"  (1.753 m)  Wt 173 lb 12.8 oz (78.835 kg)  BMI 25.65 kg/m2 , BMI Body mass index is 25.65 kg/(m^2). GEN: Well nourished, well developed, in no acute distress HEENT: normal Neck: no JVD, carotid bruits, or masses Cardiac: RRR; there is a soft systolic ejection murmur across the prosthetic aortic valve.  No diastolic murmur. Respiratory:  clear to auscultation bilaterally, normal work of breathing GI: soft, nontender, nondistended, + BS MS: no deformity or atrophy Skin: warm and dry, no rash Neuro:  Strength and sensation are intact Psych: euthymic mood, full affect   EKG:  EKG is not ordered today.    Recent Labs: No results found for requested labs within last 365 days.    Lipid Panel    Component Value Date/Time   CHOL 159 05/22/2012 1031   TRIG 131.0 05/22/2012 1031   HDL 43.80 05/22/2012 1031   CHOLHDL 4 05/22/2012 1031   VLDL 26.2 05/22/2012 1031   LDLCALC 89 05/22/2012 1031      Wt Readings from Last 3 Encounters:  07/30/14 173 lb 12.8 oz (78.835 kg)  03/21/14 181 lb (82.101 kg)  01/29/14 179 lb 1.9 oz (81.248 kg)       ASSESSMENT AND PLAN:  1.ischemic heart disease status post CABG in 2001 2. Valvular heart disease with prior severe aortic stenosis and history of aortic valve replacement using a tissue valve in 2001 3. Essential hypertension without heart failure 4. Hypercholesterolemia followed by his primary care provider Dr. Delfina Blanchard 5. Osteoarthritis of both knees  Disposition: Continue current medication. Recheck in 6 months for office visit and EKG   Current medicines are reviewed at length with the patient today.  The patient does not have concerns  regarding medicines.  The following changes have been made:  no change  Labs/ tests ordered today include:  No orders of the defined types were placed in this encounter.      Berna Spare MD 07/30/2014 4:55 PM    [facility] [address], [place], [place]  [address] Phone: [phone]; Fax: [phone]

## 2014-09-02 ENCOUNTER — Other Ambulatory Visit: Payer: Self-pay | Admitting: Neurology

## 2014-09-02 NOTE — Telephone Encounter (Signed)
Ok per [facility] 08/19

## 2014-09-05 DIAGNOSIS — M797 Fibromyalgia: Secondary | ICD-10-CM | POA: Diagnosis not present

## 2014-09-05 DIAGNOSIS — G8929 Other chronic pain: Secondary | ICD-10-CM | POA: Diagnosis not present

## 2014-09-05 DIAGNOSIS — I10 Essential (primary) hypertension: Secondary | ICD-10-CM | POA: Diagnosis not present

## 2014-09-05 DIAGNOSIS — H9190 Unspecified hearing loss, unspecified ear: Secondary | ICD-10-CM | POA: Diagnosis not present

## 2014-09-05 DIAGNOSIS — I25810 Atherosclerosis of coronary artery bypass graft(s) without angina pectoris: Secondary | ICD-10-CM | POA: Diagnosis not present

## 2014-09-05 DIAGNOSIS — M25519 Pain in unspecified shoulder: Secondary | ICD-10-CM | POA: Diagnosis not present

## 2014-09-05 DIAGNOSIS — G500 Trigeminal neuralgia: Secondary | ICD-10-CM | POA: Diagnosis not present

## 2014-09-05 DIAGNOSIS — G894 Chronic pain syndrome: Secondary | ICD-10-CM | POA: Diagnosis not present

## 2014-09-05 DIAGNOSIS — Z79899 Other long term (current) drug therapy: Secondary | ICD-10-CM | POA: Diagnosis not present

## 2014-09-05 DIAGNOSIS — G629 Polyneuropathy, unspecified: Secondary | ICD-10-CM | POA: Diagnosis not present

## 2014-09-05 DIAGNOSIS — N183 Chronic kidney disease, stage 3 (moderate): Secondary | ICD-10-CM | POA: Diagnosis not present

## 2014-09-05 DIAGNOSIS — M1990 Unspecified osteoarthritis, unspecified site: Secondary | ICD-10-CM | POA: Diagnosis not present

## 2014-09-05 DIAGNOSIS — E780 Pure hypercholesterolemia: Secondary | ICD-10-CM | POA: Diagnosis not present

## 2014-09-05 DIAGNOSIS — R413 Other amnesia: Secondary | ICD-10-CM | POA: Diagnosis not present

## 2014-09-07 ENCOUNTER — Other Ambulatory Visit: Payer: Self-pay

## 2014-09-07 DIAGNOSIS — G500 Trigeminal neuralgia: Secondary | ICD-10-CM

## 2014-09-07 MED ORDER — CARBAMAZEPINE 200 MG PO TABS: 200.0000 mg | ORAL_TABLET | Freq: Two times a day (BID) | ORAL | Status: DC

## 2014-10-21 ENCOUNTER — Other Ambulatory Visit: Payer: Self-pay | Admitting: Cardiology

## 2014-11-20 DIAGNOSIS — M1711 Unilateral primary osteoarthritis, right knee: Secondary | ICD-10-CM | POA: Diagnosis not present

## 2014-11-20 DIAGNOSIS — M25511 Pain in right shoulder: Secondary | ICD-10-CM | POA: Diagnosis not present

## 2014-11-20 DIAGNOSIS — M1712 Unilateral primary osteoarthritis, left knee: Secondary | ICD-10-CM | POA: Diagnosis not present

## 2015-01-06 ENCOUNTER — Telehealth: Payer: Self-pay | Admitting: Nurse Practitioner

## 2015-01-06 NOTE — Telephone Encounter (Signed)
New Message   Pt daughter is calling because she can not come with him to his appt   01/07/15 and she want to know i you can provide a written list of what you discuss   in the office visit.  Can you give it to pt so daughter can be aware.   She feels that her father is not going to be able to remember the instructions to tell her.

## 2015-01-07 ENCOUNTER — Other Ambulatory Visit: Payer: Self-pay

## 2015-01-07 ENCOUNTER — Inpatient Hospital Stay (HOSPITAL_COMMUNITY)
Admission: EM | Admit: 2015-01-07 | Discharge: 2015-01-13 | DRG: 394 | Disposition: A | Discharge status: Home / Self Care | Payer: Medicare Other | Attending: Internal Medicine | Admitting: Internal Medicine

## 2015-01-07 ENCOUNTER — Ambulatory Visit (HOSPITAL_BASED_OUTPATIENT_CLINIC_OR_DEPARTMENT_OTHER): Payer: Medicare Other

## 2015-01-07 ENCOUNTER — Encounter: Payer: Self-pay | Admitting: Nurse Practitioner

## 2015-01-07 ENCOUNTER — Emergency Department (HOSPITAL_COMMUNITY): Payer: Medicare Other

## 2015-01-07 ENCOUNTER — Ambulatory Visit (INDEPENDENT_AMBULATORY_CARE_PROVIDER_SITE_OTHER): Payer: Medicare Other | Admitting: Nurse Practitioner

## 2015-01-07 ENCOUNTER — Encounter (HOSPITAL_COMMUNITY): Payer: Self-pay | Admitting: Emergency Medicine

## 2015-01-07 VITALS — BP 158/70 | HR 74 | Ht 69.0 in | Wt 165.8 lb

## 2015-01-07 DIAGNOSIS — D649 Anemia, unspecified: Secondary | ICD-10-CM | POA: Diagnosis present

## 2015-01-07 DIAGNOSIS — N183 Chronic kidney disease, stage 3 unspecified: Secondary | ICD-10-CM

## 2015-01-07 DIAGNOSIS — E7800 Pure hypercholesterolemia, unspecified: Secondary | ICD-10-CM

## 2015-01-07 DIAGNOSIS — D500 Iron deficiency anemia secondary to blood loss (chronic): Secondary | ICD-10-CM | POA: Diagnosis not present

## 2015-01-07 DIAGNOSIS — K449 Diaphragmatic hernia without obstruction or gangrene: Secondary | ICD-10-CM | POA: Diagnosis present

## 2015-01-07 DIAGNOSIS — K6389 Other specified diseases of intestine: Secondary | ICD-10-CM | POA: Diagnosis present

## 2015-01-07 DIAGNOSIS — R0600 Dyspnea, unspecified: Secondary | ICD-10-CM

## 2015-01-07 DIAGNOSIS — I2510 Atherosclerotic heart disease of native coronary artery without angina pectoris: Secondary | ICD-10-CM | POA: Diagnosis present

## 2015-01-07 DIAGNOSIS — N400 Enlarged prostate without lower urinary tract symptoms: Secondary | ICD-10-CM | POA: Diagnosis present

## 2015-01-07 DIAGNOSIS — Z952 Presence of prosthetic heart valve: Secondary | ICD-10-CM | POA: Diagnosis not present

## 2015-01-07 DIAGNOSIS — I119 Hypertensive heart disease without heart failure: Secondary | ICD-10-CM

## 2015-01-07 DIAGNOSIS — Z87891 Personal history of nicotine dependence: Secondary | ICD-10-CM | POA: Diagnosis not present

## 2015-01-07 DIAGNOSIS — Z954 Presence of other heart-valve replacement: Secondary | ICD-10-CM

## 2015-01-07 DIAGNOSIS — K648 Other hemorrhoids: Secondary | ICD-10-CM | POA: Diagnosis present

## 2015-01-07 DIAGNOSIS — K5790 Diverticulosis of intestine, part unspecified, without perforation or abscess without bleeding: Secondary | ICD-10-CM | POA: Diagnosis present

## 2015-01-07 DIAGNOSIS — R195 Other fecal abnormalities: Secondary | ICD-10-CM | POA: Diagnosis not present

## 2015-01-07 DIAGNOSIS — Z791 Long term (current) use of non-steroidal anti-inflammatories (NSAID): Secondary | ICD-10-CM | POA: Diagnosis not present

## 2015-01-07 DIAGNOSIS — R634 Abnormal weight loss: Secondary | ICD-10-CM

## 2015-01-07 DIAGNOSIS — K639 Disease of intestine, unspecified: Principal | ICD-10-CM | POA: Diagnosis present

## 2015-01-07 DIAGNOSIS — Z951 Presence of aortocoronary bypass graft: Secondary | ICD-10-CM

## 2015-01-07 DIAGNOSIS — D509 Iron deficiency anemia, unspecified: Secondary | ICD-10-CM | POA: Insufficient documentation

## 2015-01-07 DIAGNOSIS — I129 Hypertensive chronic kidney disease with stage 1 through stage 4 chronic kidney disease, or unspecified chronic kidney disease: Secondary | ICD-10-CM | POA: Diagnosis present

## 2015-01-07 DIAGNOSIS — R0602 Shortness of breath: Secondary | ICD-10-CM | POA: Diagnosis not present

## 2015-01-07 DIAGNOSIS — C189 Malignant neoplasm of colon, unspecified: Secondary | ICD-10-CM

## 2015-01-07 DIAGNOSIS — I25810 Atherosclerosis of coronary artery bypass graft(s) without angina pectoris: Secondary | ICD-10-CM | POA: Diagnosis not present

## 2015-01-07 DIAGNOSIS — Z953 Presence of xenogenic heart valve: Secondary | ICD-10-CM

## 2015-01-07 DIAGNOSIS — K921 Melena: Secondary | ICD-10-CM | POA: Diagnosis not present

## 2015-01-07 DIAGNOSIS — I10 Essential (primary) hypertension: Secondary | ICD-10-CM | POA: Diagnosis not present

## 2015-01-07 DIAGNOSIS — Z7902 Long term (current) use of antithrombotics/antiplatelets: Secondary | ICD-10-CM | POA: Diagnosis not present

## 2015-01-07 DIAGNOSIS — E785 Hyperlipidemia, unspecified: Secondary | ICD-10-CM | POA: Diagnosis present

## 2015-01-07 DIAGNOSIS — N179 Acute kidney failure, unspecified: Secondary | ICD-10-CM | POA: Diagnosis present

## 2015-01-07 DIAGNOSIS — G500 Trigeminal neuralgia: Secondary | ICD-10-CM | POA: Diagnosis present

## 2015-01-07 DIAGNOSIS — C182 Malignant neoplasm of ascending colon: Secondary | ICD-10-CM | POA: Diagnosis not present

## 2015-01-07 DIAGNOSIS — K5730 Diverticulosis of large intestine without perforation or abscess without bleeding: Secondary | ICD-10-CM | POA: Diagnosis present

## 2015-01-07 DIAGNOSIS — C180 Malignant neoplasm of cecum: Secondary | ICD-10-CM | POA: Insufficient documentation

## 2015-01-07 DIAGNOSIS — R413 Other amnesia: Secondary | ICD-10-CM | POA: Diagnosis not present

## 2015-01-07 DIAGNOSIS — R911 Solitary pulmonary nodule: Secondary | ICD-10-CM | POA: Diagnosis not present

## 2015-01-07 DIAGNOSIS — R079 Chest pain, unspecified: Secondary | ICD-10-CM | POA: Diagnosis not present

## 2015-01-07 DIAGNOSIS — Z23 Encounter for immunization: Secondary | ICD-10-CM | POA: Diagnosis not present

## 2015-01-07 DIAGNOSIS — R4182 Altered mental status, unspecified: Secondary | ICD-10-CM | POA: Diagnosis not present

## 2015-01-07 DIAGNOSIS — G3184 Mild cognitive impairment, so stated: Secondary | ICD-10-CM | POA: Diagnosis present

## 2015-01-07 LAB — CBC WITH DIFFERENTIAL/PLATELET
BASOS ABS: 0 10*3/uL (ref 0.0–0.1)
BASOS PCT: 0 %
EOS PCT: 1 %
Eosinophils Absolute: 0.1 10*3/uL (ref 0.0–0.7)
HCT: 20.7 % — ABNORMAL LOW (ref 39.0–52.0)
HEMOGLOBIN: 6.2 g/dL — AB (ref 13.0–17.0)
Lymphocytes Relative: 19 %
Lymphs Abs: 1.2 10*3/uL (ref 0.7–4.0)
MCH: 22 pg — ABNORMAL LOW (ref 26.0–34.0)
MCHC: 30 g/dL (ref 30.0–36.0)
MCV: 73.4 fL — ABNORMAL LOW (ref 78.0–100.0)
MONO ABS: 0.6 10*3/uL (ref 0.1–1.0)
Monocytes Relative: 10 %
NEUTROS ABS: 4.2 10*3/uL (ref 1.7–7.7)
Neutrophils Relative %: 69 %
PLATELETS: 222 10*3/uL (ref 150–400)
RBC: 2.82 MIL/uL — ABNORMAL LOW (ref 4.22–5.81)
RDW: 16.5 % — ABNORMAL HIGH (ref 11.5–15.5)
WBC: 6 10*3/uL (ref 4.0–10.5)

## 2015-01-07 LAB — COMPREHENSIVE METABOLIC PANEL
ALBUMIN: 3.8 g/dL (ref 3.5–5.0)
ALT: 9 U/L — ABNORMAL LOW (ref 17–63)
ANION GAP: 10 (ref 5–15)
AST: 20 U/L (ref 15–41)
Alkaline Phosphatase: 87 U/L (ref 38–126)
BUN: 26 mg/dL — ABNORMAL HIGH (ref 6–20)
CHLORIDE: 103 mmol/L (ref 101–111)
CO2: 26 mmol/L (ref 22–32)
Calcium: 9.1 mg/dL (ref 8.9–10.3)
Creatinine, Ser: 1.49 mg/dL — ABNORMAL HIGH (ref 0.61–1.24)
GFR calc Af Amer: 47 mL/min — ABNORMAL LOW (ref >60)
GFR calc non Af Amer: 40 mL/min — ABNORMAL LOW (ref >60)
GLUCOSE: 141 mg/dL — AB (ref 65–99)
POTASSIUM: 4.1 mmol/L (ref 3.5–5.1)
Sodium: 139 mmol/L (ref 135–145)
TOTAL PROTEIN: 6.3 g/dL — AB (ref 6.5–8.1)
Total Bilirubin: 0.3 mg/dL (ref 0.3–1.2)

## 2015-01-07 LAB — CBC
HCT: 20 % — ABNORMAL LOW (ref 39.0–52.0)
Hemoglobin: 6.4 g/dL — CL (ref 13.0–17.0)
MCH: 22.9 pg — ABNORMAL LOW (ref 26.0–34.0)
MCHC: 32 g/dL (ref 30.0–36.0)
MCV: 71.7 fL — ABNORMAL LOW (ref 78.0–100.0)
MPV: 8.7 fL (ref 8.6–12.4)
Platelets: 237 10*3/uL (ref 150–400)
RBC: 2.79 MIL/uL — ABNORMAL LOW (ref 4.22–5.81)
RDW: 16.7 % — ABNORMAL HIGH (ref 11.5–15.5)
WBC: 6 10*3/uL (ref 4.0–10.5)

## 2015-01-07 LAB — BASIC METABOLIC PANEL
BUN: 30 mg/dL — ABNORMAL HIGH (ref 7–25)
CO2: 25 mmol/L (ref 20–31)
Calcium: 9.1 mg/dL (ref 8.6–10.3)
Chloride: 104 mmol/L (ref 98–110)
Creat: 1.49 mg/dL — ABNORMAL HIGH (ref 0.70–1.11)
Glucose, Bld: 100 mg/dL — ABNORMAL HIGH (ref 65–99)
Potassium: 4 mmol/L (ref 3.5–5.3)
Sodium: 140 mmol/L (ref 135–146)

## 2015-01-07 LAB — BRAIN NATRIURETIC PEPTIDE: Brain Natriuretic Peptide: 853 pg/mL — ABNORMAL HIGH (ref 0.0–100.0)

## 2015-01-07 LAB — ABO/RH: ABO/RH(D): O POS

## 2015-01-07 LAB — TSH: TSH: 1.721 u[IU]/mL (ref 0.350–4.500)

## 2015-01-07 LAB — PROTIME-INR
INR: 1.27 (ref 0.00–1.49)
PROTHROMBIN TIME: 16.1 s — AB (ref 11.6–15.2)

## 2015-01-07 LAB — APTT: aPTT: 39 seconds — ABNORMAL HIGH (ref 24–37)

## 2015-01-07 LAB — PREPARE RBC (CROSSMATCH)

## 2015-01-07 MED ORDER — HYDROCODONE-ACETAMINOPHEN 7.5-325 MG PO TABS
1.0000 | ORAL_TABLET | Freq: Two times a day (BID) | ORAL | Status: DC
  Administered 2015-01-08 – 2015-01-10 (×7): 1 via ORAL
  Filled 2015-01-07 (×10): qty 1

## 2015-01-07 MED ORDER — SODIUM CHLORIDE 0.9 % IV SOLN
10.0000 mL/h | Freq: Once | INTRAVENOUS | Status: AC
  Administered 2015-01-07: 10 mL/h via INTRAVENOUS

## 2015-01-07 MED ORDER — NITROGLYCERIN 0.4 MG SL SUBL: 0.4000 mg | SUBLINGUAL_TABLET | SUBLINGUAL | Status: AC | PRN

## 2015-01-07 MED ORDER — METOPROLOL TARTRATE 50 MG PO TABS
50.0000 mg | ORAL_TABLET | Freq: Two times a day (BID) | ORAL | Status: DC
  Administered 2015-01-08 – 2015-01-13 (×11): 50 mg via ORAL
  Filled 2015-01-07 (×8): qty 1
  Filled 2015-01-07: qty 2
  Filled 2015-01-07 (×3): qty 1

## 2015-01-07 MED ORDER — AMLODIPINE BESYLATE 5 MG PO TABS
2.5000 mg | ORAL_TABLET | Freq: Every day | ORAL | Status: DC
  Administered 2015-01-08 – 2015-01-10 (×3): 2.5 mg via ORAL
  Filled 2015-01-07 (×3): qty 1

## 2015-01-07 MED ORDER — PANTOPRAZOLE SODIUM 40 MG IV SOLR
40.0000 mg | INTRAVENOUS | Status: DC
  Administered 2015-01-07 – 2015-01-12 (×5): 40 mg via INTRAVENOUS
  Filled 2015-01-07 (×6): qty 40

## 2015-01-07 MED ORDER — TAMSULOSIN HCL 0.4 MG PO CAPS
0.4000 mg | ORAL_CAPSULE | Freq: Every day | ORAL | Status: DC
  Administered 2015-01-08 – 2015-01-12 (×6): 0.4 mg via ORAL
  Filled 2015-01-07 (×5): qty 1

## 2015-01-07 MED ORDER — NITROGLYCERIN 0.4 MG SL SUBL: 0.4000 mg | SUBLINGUAL_TABLET | SUBLINGUAL | Status: DC | PRN

## 2015-01-07 MED ORDER — OXYCODONE-ACETAMINOPHEN 5-325 MG PO TABS: 325.0000 | ORAL_TABLET | Freq: Four times a day (QID) | ORAL | Status: DC | PRN

## 2015-01-07 MED ORDER — FUROSEMIDE 10 MG/ML IJ SOLN: 20.0000 mg | Freq: Once | INTRAMUSCULAR | Status: DC

## 2015-01-07 MED ORDER — DONEPEZIL HCL 10 MG PO TABS
10.0000 mg | ORAL_TABLET | Freq: Every day | ORAL | Status: DC
  Administered 2015-01-08 – 2015-01-12 (×6): 10 mg via ORAL
  Filled 2015-01-07 (×6): qty 1

## 2015-01-07 MED ORDER — FUROSEMIDE 10 MG/ML IJ SOLN
20.0000 mg | Freq: Once | INTRAMUSCULAR | Status: AC
  Administered 2015-01-07: 20 mg via INTRAVENOUS
  Filled 2015-01-07: qty 2

## 2015-01-07 NOTE — ED Provider Notes (Signed)
CSN: [id number]     Arrival date & time 01/07/15  1719 History   First MD Initiated Contact with Patient 01/07/15 1823     Chief Complaint  Patient presents with  . Abnormal Lab     (Consider location/radiation/quality/duration/timing/severity/associated sxs/prior Treatment) HPI Comments: 81 y.o. Male with history of CAD s/p CABG, bovine aortic valve presents for low hemoglobin.  The patient had presented to his cardiologist office for shortness of breath and chest pain on which occurs on exertion.  He said that this has been becoming progressively worse and his symptoms have been coming with less and less exertion.  He had thought that it may be related to his replaced aortic valve but under went an Echo today through his cardiologist's office.  The patient was also sent for blood work and then called and told to go to the ER because his hemoglobin was found to be 6.4.  The patient denies easy bleeding or bruising.  Patient denies dark or black colored stool.  No abdominal pain.     Past Medical History  Diagnosis Date  . Hyperlipidemia   . Hypertension   . AVD (aortic valve disease)   . Coronary atherosclerosis   . Fatigue   . Trigeminal neuralgia    Past Surgical History  Procedure Laterality Date  . Cardiac catheterization  06/22/1999    SEVERE HYPOKINESIA OF THE MID TO DISTAL INFERIOR WALL AND APEX.MODERATE LEFT VENTRICULAR DYSFUNCTION WITH EF 45%  . Aortic valve replacement    . Coronary artery bypass graft  2001   Family History  Problem Relation Age of Onset  . Stroke Mother   . Aneurysm Mother   . Aneurysm Father   . Heart disease Brother    Social History  Substance Use Topics  . Smoking status: Former Smoker    Quit date: 09/13/1985  . Smokeless tobacco: Never Used  . Alcohol Use: No    Review of Systems  Constitutional: Positive for fatigue. Negative for fever and chills.  HENT: Negative for congestion, postnasal drip, rhinorrhea and sinus pressure.   Eyes:  Negative for pain and redness.  Respiratory: Positive for shortness of breath (on exertion). Negative for cough, chest tightness and wheezing.   Cardiovascular: Positive for chest pain (on exertion). Negative for palpitations and leg swelling.  Gastrointestinal: Negative for nausea, vomiting, abdominal pain and diarrhea.  Genitourinary: Negative for dysuria, urgency and hematuria.  Musculoskeletal: Negative for myalgias and back pain.  Skin: Negative for rash.  Neurological: Positive for weakness (generalized). Negative for dizziness and headaches.  Hematological: Does not bruise/bleed easily.      Allergies  Gabapentin; Lipitor; and Niaspan  Home Medications   Prior to Admission medications   Medication Sig Start Date End Date Taking? Authorizing Provider  amLODipine (NORVASC) 2.5 MG tablet TAKE 1 BY MOUTH DAILY 07/30/14  Yes Darlin Coco, MD  Calcium Carbonate-Vitamin D (CALCIUM 600 + D PO) Take 1 tablet by mouth daily.   Yes Historical Provider, MD  carbamazepine (TEGRETOL) 200 MG tablet Take 1 tablet (200 mg total) by mouth 2 (two) times daily. 09/07/14  Yes Marcial Pacas, MD  clopidogrel (PLAVIX) 75 MG tablet Take 1 tablet (75 mg total) by mouth daily. 03/21/14  Yes Marcial Pacas, MD  CRANBERRY PO Take 1 tablet by mouth daily. Taking  2000 daily   Yes Historical Provider, MD  diclofenac (FLECTOR) 1.3 % PTCH Place 1 patch onto the skin 2 (two) times daily. Patient taking differently: Place 1 patch onto the  skin 2 (two) times daily. For pain 03/21/14  Yes Marcial Pacas, MD  Docusate Calcium (STOOL SOFTENER PO) Take 1 tablet by mouth daily as needed. For soft stools   Yes Historical Provider, MD  donepezil (ARICEPT) 10 MG tablet TAKE 1 BY MOUTH AT BEDTIME 09/02/14  Yes Marcial Pacas, MD  HYDROcodone-acetaminophen (NORCO) 7.5-325 MG per tablet Take 1 tablet by mouth 2 (two) times daily. 05/13/14  Yes Historical Provider, MD  losartan-hydrochlorothiazide (HYZAAR) 100-25 MG per tablet TAKE 1 BY MOUTH DAILY  10/21/14  Yes Darlin Coco, MD  metoprolol (LOPRESSOR) 50 MG tablet TAKE 1 BY MOUTH TWICE DAILY 10/21/14  Yes Darlin Coco, MD  Misc Natural Products (OSTEO BI-FLEX JOINT SHIELD PO) Take by mouth 2 (two) times daily.   Yes Historical Provider, MD  Multiple Vitamin (MULTIVITAMIN) tablet Take 1 tablet by mouth daily.     Yes Historical Provider, MD  nitroGLYCERIN (NITROSTAT) 0.4 MG SL tablet Place 1 tablet (0.4 mg total) under the tongue every 5 (five) minutes as needed for chest pain. 01/07/15  Yes Burtis Junes, NP  oxyCODONE-acetaminophen (PERCOCET/ROXICET) 5-325 MG per tablet Take 325 tablets by mouth every 6 (six) hours as needed for moderate pain.  03/20/13  Yes Historical Provider, MD  pregabalin (LYRICA) 50 MG capsule Take 50 mg by mouth daily as needed. For pain   Yes Historical Provider, MD  rosuvastatin (CRESTOR) 10 MG tablet Take 5 mg by mouth See admin instructions. Take 3 times a week no specific days per patient   Yes Historical Provider, MD  tamsulosin (FLOMAX) 0.4 MG CAPS capsule  01/07/14  Yes Historical Provider, MD  HYDROcodone-acetaminophen (LORTAB) 10-500 MG per tablet Take 1 tablet by mouth every 6 (six) hours as needed. Patient not taking: Reported on 01/07/2015 08/31/11   Darlin Coco, MD   BP 148/51 mmHg  Pulse 71  Temp(Src) 98 F (36.7 C) (Oral)  Resp 16  SpO2 98% Physical Exam  Constitutional: He is oriented to person, place, and time. He appears well-developed and well-nourished. No distress.  HENT:  Head: Normocephalic and atraumatic.  Right Ear: External ear normal.  Left Ear: External ear normal.  Mouth/Throat: Oropharynx is clear and moist.  Eyes: EOM are normal. Pupils are equal, round, and reactive to light.  Pale conjunctiva  Neck: Normal range of motion. Neck supple.  Cardiovascular: Normal rate, regular rhythm and intact distal pulses.   Murmur heard. Pulmonary/Chest: Effort normal. No respiratory distress. He has no wheezes. He has no rales.   Abdominal: Soft. He exhibits no distension. There is no tenderness.  Musculoskeletal: He exhibits edema (1+ bilateral, symmetric of the lower extremities). He exhibits no tenderness.  Neurological: He is alert and oriented to person, place, and time.  Skin: Skin is warm and dry. No rash noted. He is not diaphoretic.  Vitals reviewed.   ED Course  Procedures (including critical care time) Labs Review Labs Reviewed  COMPREHENSIVE METABOLIC PANEL - Abnormal; Notable for the following:    Glucose, Bld 141 (*)    BUN 26 (*)    Creatinine, Ser 1.49 (*)    Total Protein 6.3 (*)    ALT 9 (*)    GFR calc non Af Amer 40 (*)    GFR calc Af Amer 47 (*)    All other components within normal limits  CBC WITH DIFFERENTIAL/PLATELET - Abnormal; Notable for the following:    RBC 2.82 (*)    Hemoglobin 6.2 (*)    HCT 20.7 (*)  MCV 73.4 (*)    MCH 22.0 (*)    RDW 16.5 (*)    All other components within normal limits  PROTIME-INR - Abnormal; Notable for the following:    Prothrombin Time 16.1 (*)    All other components within normal limits  APTT - Abnormal; Notable for the following:    aPTT 39 (*)    All other components within normal limits  FERRITIN  CBC WITH DIFFERENTIAL/PLATELET  POC OCCULT BLOOD, ED  TYPE AND SCREEN  PREPARE RBC (CROSSMATCH)  ABO/RH    Imaging Review Dg Chest 2 View  01/07/2015  CLINICAL DATA:  81 year old male with shortness of breath on exertion and chest tightness for the past 1-1/2 months. EXAM: CHEST  2 VIEW COMPARISON:  No priors. FINDINGS: Lung volumes are normal. No consolidative airspace disease. No pleural effusions. Mild bibasilar scarring. No pneumothorax. Well demarcated 7 mm nodule projecting over the lower left hemithorax, favored to represent a nipple shadow (not visualized on the lateral projection). No other suspicious appearing pulmonary nodules or masses are otherwise noted. No evidence of pulmonary edema. Heart size is within normal limits, but  there is prominence of the left ventricular contour, which could indicate an underlying left ventricular hypertrophy. Status post median sternotomy for CABG, including LIMA, in addition to aortic valve replacement (a stented bioprosthesis is noted). Atherosclerosis in the thoracic aorta. IMPRESSION: 1. No radiographic evidence of acute cardiopulmonary disease. 2. 7 mm well demarcated nodular density projecting over the lower left hemithorax, favored to represent a prominent nipple shadow. Repeat standing PA and lateral chest radiographs are recommended in 3-4 weeks with nipple markers in place to ensure the stability or resolution of this finding. 3. Atherosclerosis. Electronically Signed   By: Vinnie Langton [name]   On: 01/07/2015 19:14   I have personally reviewed and evaluated these images and lab results as part of my medical decision-making.   EKG Interpretation   Date/Time:  Tuesday January 07 2015 18:39:31 EDT Ventricular Rate:  74 PR Interval:  222 QRS Duration: 128 QT Interval:  391 QTC Calculation: 434 R Axis:   0 Text Interpretation:  Sinus rhythm Ventricular premature complex Prolonged  PR interval Left bundle branch block No significant change since last  tracing Confirmed by NGUYEN, EMILY ([id number]) on 01/07/2015 6:49:42 PM      MDM  Patient seen and evaluated in stable condition.  Exertional dyspnea and chest pain.  Results from outpatient setting and cardiology visit reviewed.  Repeat Hb 6.2.  Hemoccult positive on rectal examination without frank blood or clots in the rectum.  2 units pRBCs with 20 mg lasix for administation between units was ordered.  Case discussed with Dr. Dreama Saa who agreed with admission and patient was admitted under his care to telemetry for continue treatment. Final diagnoses:  Symptomatic anemia    1. Symptomatic anemia  2. GI bleed    Harvel Quale, MD 01/07/15 2231

## 2015-01-07 NOTE — ED Notes (Signed)
At bedside for first 15 minutes of blood transfusion.

## 2015-01-07 NOTE — ED Notes (Signed)
Pt sts sent here for eval of hgb of 6.4; pt denies black stool or obvious bleeding; pt takes Plavix

## 2015-01-07 NOTE — H&P (Signed)
History and Physical  Michael Blanchard ZRA:[id number] DOB: [birth year] DOA: 01/07/2015  PCP: [name], MD   Chief Complaint: dyspnea   History of Present Illness:  Patient is a 81 yo male with history of CAD s/p CABG in 2001, AVR ( bovine), and HTN who was sent here due to symptomatic anemia with Hb of 6.4. He went for a routine f/u appointment with his cardiologist today complaining of dyspnea for the last few months with exertional chest pain. EKG was non ischemic and Echo revealed functional aortic valve with moderate MR. CBC showed HB of 6.4, lower than baseline of 13. He denies orthopnea or PND or LE edema. No melena, hemorrhoids, hemoptysis, hematochezia, or dark stool. No history of PUD. No colonoscopies done in the past.   Review of Systems:  CONSTITUTIONAL:  No night sweats.  +fatigue, malaise, lethargy.  No fever or chills. Eyes:  No visual changes.  No eye pain.  No eye discharge.   ENT:    No epistaxis.  No sinus pain.  No sore throat.  No ear pain.  No congestion. RESPIRATORY:  No cough.  No wheeze.  No hemoptysis.  +shortness of breath. CARDIOVASCULAR:  +chest pains.  No palpitations. GASTROINTESTINAL:  No abdominal pain.  No nausea or vomiting.  No diarrhea or constipation.  No hematemesis.  No hematochezia.  No melena. GENITOURINARY:  No urgency.  No frequency.  No dysuria.  No hematuria.  +obstructive symptoms.  No discharge.  No pain.  No significant abnormal bleeding. MUSCULOSKELETAL:  No musculoskeletal pain.  No joint swelling.  No arthritis. NEUROLOGICAL:  No confusion.  No weakness. No headache. No seizure. PSYCHIATRIC:  No depression. No anxiety. No suicidal ideation. SKIN:  No rashes.  No lesions.  No wounds. ENDOCRINE:  +weight loss.  No polydipsia.  No polyuria.  No polyphagia. HEMATOLOGIC:  No anemia.  No purpura.  No petechiae.  No bleeding.  ALLERGIC AND IMMUNOLOGIC:  No pruritus.  No swelling Other:  Past Medical and Surgical History:   Past  Medical History  Diagnosis Date  . Hyperlipidemia   . Hypertension   . AVD (aortic valve disease)   . Coronary atherosclerosis   . Fatigue   . Trigeminal neuralgia    Past Surgical History  Procedure Laterality Date  . Cardiac catheterization  06/22/1999    SEVERE HYPOKINESIA OF THE MID TO DISTAL INFERIOR WALL AND APEX.MODERATE LEFT VENTRICULAR DYSFUNCTION WITH EF 45%  . Aortic valve replacement    . Coronary artery bypass graft  2001    Social History:   reports that he quit smoking about 29 years ago. He has never used smokeless tobacco. He reports that he does not drink alcohol or use illicit drugs.   Allergies  Allergen Reactions  . Gabapentin     unknown  . Lipitor [Atorvastatin Calcium]     unknown  . Niaspan [Niacin]     unknown    Family History  Problem Relation Age of Onset  . Stroke Mother   . Aneurysm Mother   . Aneurysm Father   . Heart disease Brother       Prior to Admission medications   Medication Sig Start Date End Date Taking? Authorizing Provider  amLODipine (NORVASC) 2.5 MG tablet TAKE 1 BY MOUTH DAILY 07/30/14  Yes Darlin Coco, MD  Calcium Carbonate-Vitamin D (CALCIUM 600 + D PO) Take 1 tablet by mouth daily.   Yes Historical Provider, MD  carbamazepine (TEGRETOL) 200 MG tablet Take 1 tablet (200  mg total) by mouth 2 (two) times daily. 09/07/14  Yes Marcial Pacas, MD  clopidogrel (PLAVIX) 75 MG tablet Take 1 tablet (75 mg total) by mouth daily. 03/21/14  Yes Marcial Pacas, MD  CRANBERRY PO Take 1 tablet by mouth daily. Taking  2000 daily   Yes Historical Provider, MD  diclofenac (FLECTOR) 1.3 % PTCH Place 1 patch onto the skin 2 (two) times daily. Patient taking differently: Place 1 patch onto the skin 2 (two) times daily. For pain 03/21/14  Yes Marcial Pacas, MD  Docusate Calcium (STOOL SOFTENER PO) Take 1 tablet by mouth daily as needed. For soft stools   Yes Historical Provider, MD  donepezil (ARICEPT) 10 MG tablet TAKE 1 BY MOUTH AT BEDTIME 09/02/14  Yes  Marcial Pacas, MD  HYDROcodone-acetaminophen (NORCO) 7.5-325 MG per tablet Take 1 tablet by mouth 2 (two) times daily. 05/13/14  Yes Historical Provider, MD  losartan-hydrochlorothiazide (HYZAAR) 100-25 MG per tablet TAKE 1 BY MOUTH DAILY 10/21/14  Yes Darlin Coco, MD  metoprolol (LOPRESSOR) 50 MG tablet TAKE 1 BY MOUTH TWICE DAILY 10/21/14  Yes Darlin Coco, MD  Misc Natural Products (OSTEO BI-FLEX JOINT SHIELD PO) Take by mouth 2 (two) times daily.   Yes Historical Provider, MD  Multiple Vitamin (MULTIVITAMIN) tablet Take 1 tablet by mouth daily.     Yes Historical Provider, MD  nitroGLYCERIN (NITROSTAT) 0.4 MG SL tablet Place 1 tablet (0.4 mg total) under the tongue every 5 (five) minutes as needed for chest pain. 01/07/15  Yes Burtis Junes, NP  oxyCODONE-acetaminophen (PERCOCET/ROXICET) 5-325 MG per tablet Take 325 tablets by mouth every 6 (six) hours as needed for moderate pain.  03/20/13  Yes Historical Provider, MD  pregabalin (LYRICA) 50 MG capsule Take 50 mg by mouth daily as needed. For pain   Yes Historical Provider, MD  rosuvastatin (CRESTOR) 10 MG tablet Take 5 mg by mouth See admin instructions. Take 3 times a week no specific days per patient   Yes Historical Provider, MD  tamsulosin (FLOMAX) 0.4 MG CAPS capsule  01/07/14  Yes Historical Provider, MD  HYDROcodone-acetaminophen (LORTAB) 10-500 MG per tablet Take 1 tablet by mouth every 6 (six) hours as needed. Patient not taking: Reported on 01/07/2015 08/31/11   Darlin Coco, MD    Physical Exam: BP 148/51 mmHg  Pulse 71  Temp(Src) 98 F (36.7 C) (Oral)  Resp 16  SpO2 98%  GENERAL : Well developed, well nourished, alert and cooperative, and appears to be in no acute distress. HEAD: normocephalic. EYES: PERRL, EOMI. EARS:hearing grossly intact. NOSE: No nasal discharge. THROAT: Oral cavity and pharynx normal. No inflammation, swelling, exudate, or lesions.  NECK: Neck supple, non-tender  CARDIAC: Normal S1 and S2. No  S3, S4. holo-systolic murmur. Rhythm is regular. There is no peripheral edema. LUNGS: Clear to auscultation and percussion without rales, rhonchi, wheezing or diminished breath sounds. ABDOMEN: Positive bowel sounds. Soft, nondistended, nontender. No guarding or rebound. No masses. EXTREMITIES: No significant deformity or joint abnormality. No edema. Peripheral pulses intact. No varicosities. NEUROLOGICAL: The mental examination revealed the patient was oriented to person, place, and time.CN II-XII intact. Strength and sensation symmetric and intact throughout. SKIN: Skin normal color. PSYCHIATRIC:  The patient was able to demonstrate good judgement and reason, without hallucinations, abnormal affect or abnormal behaviors during the examination. Patient is not suicidal.          Labs on Admission:  Reviewed.   Radiological Exams on Admission: Dg Chest 2 View  01/07/2015  CLINICAL DATA:  81 year old male with shortness of breath on exertion and chest tightness for the past 1-1/2 months. EXAM: CHEST  2 VIEW COMPARISON:  No priors. FINDINGS: Lung volumes are normal. No consolidative airspace disease. No pleural effusions. Mild bibasilar scarring. No pneumothorax. Well demarcated 7 mm nodule projecting over the lower left hemithorax, favored to represent a nipple shadow (not visualized on the lateral projection). No other suspicious appearing pulmonary nodules or masses are otherwise noted. No evidence of pulmonary edema. Heart size is within normal limits, but there is prominence of the left ventricular contour, which could indicate an underlying left ventricular hypertrophy. Status post median sternotomy for CABG, including LIMA, in addition to aortic valve replacement (a stented bioprosthesis is noted). Atherosclerosis in the thoracic aorta. IMPRESSION: 1. No radiographic evidence of acute cardiopulmonary disease. 2. 7 mm well demarcated nodular density projecting over the lower left hemithorax,  favored to represent a prominent nipple shadow. Repeat standing PA and lateral chest radiographs are recommended in 3-4 weeks with nipple markers in place to ensure the stability or resolution of this finding. 3. Atherosclerosis. Electronically Signed   By: Vinnie Langton [name]   On: 01/07/2015 19:14    EKG:  Independently reviewed. New LBBB but echo with normal LVF.   Assessment/Plan  Symptomatic anemia: Likely chronic with microcytic anemia  2 units RBC transfusion ordered : 20 mg IV lasix in between Iron profile ordered Hold plavix for today awaiting repeat hb  AKI vs CKD Cr 1.4, baseline of 1.2 Hold ACEI for today,repeat cr in am  CAD s/p CABG: On plavix and statin   Valvular disease s/p AVR:  Echo with functional AV   DVT prophylaxis: SCDs GI prophylaxis: PPI IV Consultants: GI in am Code Status: Full     Gennaro Africa M.D [facility]

## 2015-01-07 NOTE — ED Notes (Signed)
Pt in X ray

## 2015-01-07 NOTE — ED Notes (Signed)
Admitting at bedside 

## 2015-01-07 NOTE — ED Notes (Signed)
Consent for blood signed and at bedside.

## 2015-01-07 NOTE — Progress Notes (Signed)
CARDIOLOGY OFFICE NOTE  Date:  01/07/2015    [place] Date of Birth: 1925-07-18 Medical Record #[id number]  PCP:  [name], MD  Cardiologist:  Mare Ferrari    Chief Complaint  Patient presents with  . Coronary Artery Disease    Follow up visit - seen for Dr. Mare Ferrari    History of Present Illness: Michael Blanchard is a 81 y.o. male who presents today for a follow up visit. Seen for Dr. Mare Ferrari. He has a history of ischemic heart disease and valvular heart disease. He had coronary artery bypass graft surgery as well as aortic valve replacement using a tissue valve back in 2001. Nuclear stress test in 2007 showed no evidence of ischemia. His ejection fraction was 59%. He had an echocardiogram in February 2010 showing normal LV systolic function with impaired relaxation and showed normal tissue aortic valve replacement with no aortic insufficiency. Other issues include HTN, HLD and OA. Labs are checked by PCP. He is on Aricept for memory issues.   Last seen by Dr. Mare Ferrari back in May and felt to be doing well. Going to the gym regularly.   His daughter has sent him with a note for me today - basically says she needs a summary of his visit because of his memory. She wanted [place] to be aware that he is having TIAs. No specific details given.  NO DPR ON THE CHART  Comes back today. Here alone. He tells me he is worried about his valve due to the age of the valve. He is short of breath - says this is because he is in so much pain. He says he has been ok until 30 days ago - then started feeling a "pit in his stomach up to his throat" with exertion - resolves with rest and occurs with just minimal exertion. He has used some NTG - really can't quantify. Sounds like just maybe one or two over the past month. He is not able to tell me anything about the "TIAs". He has considerable lost weight. He says he is eating less due to his wife being ill. ?salt use. Will have some swelling off  and on but seems to be his baseline.   Past Medical History  Diagnosis Date  . Hyperlipidemia   . Hypertension   . AVD (aortic valve disease)   . Coronary atherosclerosis   . Fatigue   . Trigeminal neuralgia     Past Surgical History  Procedure Laterality Date  . Cardiac catheterization  06/22/1999    SEVERE HYPOKINESIA OF THE MID TO DISTAL INFERIOR WALL AND APEX.MODERATE LEFT VENTRICULAR DYSFUNCTION WITH EF 45%  . Aortic valve replacement    . Coronary artery bypass graft  2001     Medications: Current Outpatient Prescriptions  Medication Sig Dispense Refill  . Amantadine HCl 100 MG tablet Take 100 mg by mouth daily.    Marland Kitchen amLODipine (NORVASC) 2.5 MG tablet TAKE 1 BY MOUTH DAILY 90 tablet 3  . Calcium Carbonate-Vitamin D (CALCIUM 600 + D PO) Take 1 tablet by mouth daily.    . carbamazepine (TEGRETOL) 200 MG tablet Take 1 tablet (200 mg total) by mouth 2 (two) times daily. 180 tablet 1  . clopidogrel (PLAVIX) 75 MG tablet Take 1 tablet (75 mg total) by mouth daily. 100 tablet 3  . CRANBERRY PO Take by mouth. Taking  2000 daily     . diclofenac (FLECTOR) 1.3 % PTCH Place 1 patch onto the skin  2 (two) times daily. 30 patch 6  . donepezil (ARICEPT) 10 MG tablet TAKE 1 BY MOUTH AT BEDTIME 90 tablet 1  . HYDROcodone-acetaminophen (LORTAB) 10-500 MG per tablet Take 1 tablet by mouth every 6 (six) hours as needed. 300 tablet 1  . HYDROcodone-acetaminophen (NORCO) 7.5-325 MG per tablet Take 1 tablet by mouth 2 (two) times daily.  0  . losartan-hydrochlorothiazide (HYZAAR) 100-25 MG per tablet TAKE 1 BY MOUTH DAILY 90 tablet 0  . metoprolol (LOPRESSOR) 50 MG tablet TAKE 1 BY MOUTH TWICE DAILY 180 tablet 0  . Misc Natural Products (OSTEO BI-FLEX JOINT SHIELD PO) Take by mouth 2 (two) times daily.    . Multiple Vitamin (MULTIVITAMIN) tablet Take 1 tablet by mouth daily.      Marland Kitchen NITROSTAT 0.4 MG SL tablet   0  . oxyCODONE-acetaminophen (PERCOCET/ROXICET) 5-325 MG per tablet Take 325 tablets  by mouth daily.    . rosuvastatin (CRESTOR) 10 MG tablet Take 5 mg by mouth every other day.      . tamsulosin (FLOMAX) 0.4 MG CAPS capsule   11   No current facility-administered medications for this visit.    Allergies: Allergies  Allergen Reactions  . Gabapentin   . Lipitor [Atorvastatin Calcium]   . Niaspan [Niacin]     Social History: The patient  reports that he quit smoking about 29 years ago. He has never used smokeless tobacco. He reports that he does not drink alcohol or use illicit drugs.   Family History: The patient's family history includes Aneurysm in his father and mother; Heart disease in his brother; Stroke in his mother.   Review of Systems: Please see the history of present illness.   Otherwise, the review of systems is positive for none.   All other systems are reviewed and negative.   Physical Exam: VS:  BP 158/70 mmHg  Pulse 74  Ht 5\' 9"  (1.753 m)  Wt 165 lb 12.8 oz (75.206 kg)  BMI 24.47 kg/m2  SpO2 99% .  BMI Body mass index is 24.47 kg/(m^2).  Wt Readings from Last 3 Encounters:  01/07/15 165 lb 12.8 oz (75.206 kg)  07/30/14 173 lb 12.8 oz (78.835 kg)  03/21/14 181 lb (82.101 kg)    General: Pleasant. Elderly male. Little pale in appearance but in no acute distress.  His weight is down 7 pounds since last visit. Down 16 pounds since January.  HEENT: Normal. Neck: Supple, no JVD, carotid bruits, or masses noted.  Cardiac: Regular rate and rhythm. Systolic murmur noted. Trace edema.  Respiratory:  Lungs are fairly clear to auscultation bilaterally with normal work of breathing.  GI: Soft and nontender.  MS: No deformity or atrophy. Gait and ROM intact.He is using a cane Skin: Warm and dry. Color is normal.  Neuro:  Strength and sensation are intact and no gross focal deficits noted.  Psych: Alert, appropriate and with normal affect.   LABORATORY DATA:  EKG:  EKG is ordered today. This shows NSR with new LBBB.   Lab Results  Component  Value Date   WBC 7.2 09/18/2012   HGB 13.1 09/18/2012   HCT 39.4 09/18/2012   PLT 242 09/18/2012   GLUCOSE 89 09/18/2012   CHOL 159 05/22/2012   TRIG 131.0 05/22/2012   HDL 43.80 05/22/2012   LDLCALC 89 05/22/2012   ALT 11 09/18/2012   AST 15 09/18/2012   NA 144 09/18/2012   K 4.1 09/18/2012   CL 103 09/18/2012   CREATININE 1.27 09/18/2012  BUN 26 09/18/2012   CO2 25 09/18/2012   TSH 1.28 05/25/2011    BNP (last 3 results) No results for input(s): BNP in the last 8760 hours.  ProBNP (last 3 results) No results for input(s): PROBNP in the last 8760 hours.   Other Studies Reviewed Today:   Assessment/Plan: 1.  Ischemic heart disease status post CABG in 2001 - may be having angina. Refilled his NTG today. Update his echo. Would hold on stress testing for now. May need to try and add Imdur. Will see what his echo looks like first.   2. Valvular heart disease with prior severe aortic stenosis and history of aortic valve replacement using a tissue valve in 2001 - updating echo - will have later today.   3. Essential hypertension - BP just fair but will continue to monitor.   4. Hypercholesterolemia on statin therapy  5. Osteoarthritis of both knees  6. Dyspnea and chest pain - will check labs. Update his echo. Further disposition to follow.   7. Weight loss - ? If really intentional. Will see what baseline labs look like.  8. ?TIAs - no real specifics given and patient denies at this time   Current medicines are reviewed with the patient today.  The patient does not have concerns regarding medicines other than what has been noted above.  The following changes have been made:  See above.  Labs/ tests ordered today include:    Orders Placed This Encounter  Procedures  . Brain natriuretic peptide  . Basic metabolic panel  . CBC     Disposition:   FU with Dr. Mare Ferrari in 4 months.   Patient is agreeable to this plan and will call if any problems develop in  the interim.   Signed: Burtis Junes, RN, ANP-C 01/07/2015 10:49 AM  [place] [address] [place], [place]  [address] Phone: [phone] Fax: [phone]

## 2015-01-07 NOTE — Patient Instructions (Addendum)
We will be checking the following labs today - BMET, BNP, TSH and CBC   Medication Instructions:    Continue with your current medicines for now.  I have refilled your NTG today. Use your NTG under your tongue for recurrent chest pain. May take one tablet every 5 minutes. If you are still having discomfort after 3 tablets in 15 minutes, call 911.    Testing/Procedures To Be Arranged: Echocardiogram - this is to recheck your valve  Follow-Up:   See me in about 2 to 3 weeks on a day that Dr. Mare Ferrari is here  See Dr. Mare Ferrari in 4 months.     Other Special Instructions:   We will do some testing to sort out why you are short of breath and have chest pain. We will check labs today and arrange for echocardiogram. We will bring you back for discussion and possible change in medicines.   Please have your daughter call us if she has any questions.   Call the [place] office at [phone] if you have any questions, problems or concerns.

## 2015-01-08 ENCOUNTER — Inpatient Hospital Stay (HOSPITAL_COMMUNITY): Payer: Medicare Other

## 2015-01-08 ENCOUNTER — Encounter (HOSPITAL_COMMUNITY): Payer: Self-pay | Admitting: *Deleted

## 2015-01-08 DIAGNOSIS — N183 Chronic kidney disease, stage 3 unspecified: Secondary | ICD-10-CM

## 2015-01-08 DIAGNOSIS — D649 Anemia, unspecified: Secondary | ICD-10-CM

## 2015-01-08 DIAGNOSIS — E7800 Pure hypercholesterolemia, unspecified: Secondary | ICD-10-CM

## 2015-01-08 DIAGNOSIS — I10 Essential (primary) hypertension: Secondary | ICD-10-CM

## 2015-01-08 LAB — IRON AND TIBC
Iron: 77 ug/dL (ref 45–182)
Saturation Ratios: 21 % (ref 17.9–39.5)
TIBC: 370 ug/dL (ref 250–450)
UIBC: 293 ug/dL

## 2015-01-08 LAB — RETICULOCYTES
RBC.: 3.38 MIL/uL — AB (ref 4.22–5.81)
RETIC COUNT ABSOLUTE: 27 10*3/uL (ref 19.0–186.0)
RETIC CT PCT: 0.8 % (ref 0.4–3.1)

## 2015-01-08 LAB — CBC WITH DIFFERENTIAL/PLATELET
Basophils Absolute: 0 10*3/uL (ref 0.0–0.1)
Basophils Relative: 1 %
EOS ABS: 0.1 10*3/uL (ref 0.0–0.7)
EOS PCT: 3 %
HCT: 23.3 % — ABNORMAL LOW (ref 39.0–52.0)
Hemoglobin: 7.2 g/dL — ABNORMAL LOW (ref 13.0–17.0)
LYMPHS ABS: 1.3 10*3/uL (ref 0.7–4.0)
LYMPHS PCT: 24 %
MCH: 23.2 pg — AB (ref 26.0–34.0)
MCHC: 30.9 g/dL (ref 30.0–36.0)
MCV: 75.2 fL — AB (ref 78.0–100.0)
MONO ABS: 0.6 10*3/uL (ref 0.1–1.0)
Monocytes Relative: 12 %
Neutro Abs: 3.2 10*3/uL (ref 1.7–7.7)
Neutrophils Relative %: 60 %
PLATELETS: 182 10*3/uL (ref 150–400)
RBC: 3.1 MIL/uL — AB (ref 4.22–5.81)
RDW: 17.1 % — AB (ref 11.5–15.5)
WBC: 5.3 10*3/uL (ref 4.0–10.5)

## 2015-01-08 LAB — OCCULT BLOOD, POC DEVICE: FECAL OCCULT BLD: POSITIVE — AB

## 2015-01-08 LAB — PREPARE RBC (CROSSMATCH)

## 2015-01-08 LAB — FERRITIN
FERRITIN: 8 ng/mL — AB (ref 24–336)
Ferritin: 10 ng/mL — ABNORMAL LOW (ref 24–336)

## 2015-01-08 MED ORDER — IOHEXOL 300 MG/ML  SOLN: 25.0000 mL | INTRAMUSCULAR | Status: AC

## 2015-01-08 MED ORDER — SODIUM CHLORIDE 0.9 % IV SOLN
Freq: Once | INTRAVENOUS | Status: AC
  Administered 2015-01-08: 17:00:00 via INTRAVENOUS

## 2015-01-08 NOTE — Progress Notes (Signed)
PROGRESS NOTE  Michael Blanchard [facility] IZT:[id number] DOB: Jul 15, 1925 DOA: 01/07/2015 PCP: [name], MD Brief history 81 year old male with a history of CAD status post CABG, bioprosthetic aortic valve replacement, hypertension presented with symptomatic anemia. For the past month, the patient has been complaining of exertional chest discomfort and shortness of breath. He denies any syncope, nausea, vomiting, diarrhea. There is another medication or melena. He went to see his cardiologist on 01/07/2015. Blood work was obtained and revealed a hemoglobin of 6.4. As result, the patient was told to come to the emergency department for further evaluation.   the patient denies any NSAID use but does use a Flector patch. Assessment/Plan: Symptomatically anemia/chronic blood loss anemia  -09/18/2012 hemoglobin 13.1  -01/07/2015 hemoglobin 6.4  -transfused 2 units PRBC -01/08/15--transfuse another 2 units (4 total) -iron studies -FOBT positive -pt has never had EGD or colonoscopy -consult [facility] -continue clears for now pending GI eval CKD stage III  -Baseline creatinine 1.2-1.4   CAD with history of CABG -Exertional chest discomfort likely due to the patient's anemia -Repeat EKG in the morning -01/07/2015 echo shows EF 55-60%, grade 2 DD, no WMA -Continue metoprolol -Hold Plavix pending GI eval S/P bioprosthetic AVR -Echo--no obstruction Hypertension -Continue metoprolol and amlodipine -hold Hyzaar and monitor  hyperlipidemia  -Continue statin  Mild Cognitive Impairment -continue Aricept   Family Communication:   Pt at beside Disposition Plan:   Home when medically stable        Procedures/Studies: Dg Chest 2 View  01/07/2015  CLINICAL DATA:  81 year old male with shortness of breath on exertion and chest tightness for the past 1-1/2 months. EXAM: CHEST  2 VIEW COMPARISON:  No priors. FINDINGS: Lung volumes are normal. No consolidative airspace disease. No pleural  effusions. Mild bibasilar scarring. No pneumothorax. Well demarcated 7 mm nodule projecting over the lower left hemithorax, favored to represent a nipple shadow (not visualized on the lateral projection). No other suspicious appearing pulmonary nodules or masses are otherwise noted. No evidence of pulmonary edema. Heart size is within normal limits, but there is prominence of the left ventricular contour, which could indicate an underlying left ventricular hypertrophy. Status post median sternotomy for CABG, including LIMA, in addition to aortic valve replacement (a stented bioprosthesis is noted). Atherosclerosis in the thoracic aorta. IMPRESSION: 1. No radiographic evidence of acute cardiopulmonary disease. 2. 7 mm well demarcated nodular density projecting over the lower left hemithorax, favored to represent a prominent nipple shadow. Repeat standing PA and lateral chest radiographs are recommended in 3-4 weeks with nipple markers in place to ensure the stability or resolution of this finding. 3. Atherosclerosis. Electronically Signed   By: Vinnie Langton [name]   On: 01/07/2015 19:14         Subjective:   Objective: Filed Vitals:   01/08/15 0000 01/08/15 0300 01/08/15 0516 01/08/15 1051  BP: 146/46 135/45 129/53 135/57  Pulse: 76 57 60 55  Temp: 98.8 F (37.1 C) 97.8 F (36.6 C) 98.4 F (36.9 C) 97.8 F (36.6 C)  TempSrc: Oral Oral Oral Oral  Resp: 18 18 18 18   Weight:      SpO2: 100% 97% 98% 98%    Intake/Output Summary (Last 24 hours) at 01/08/15 1518 Last data filed at 01/08/15 1300  Gross per 24 hour  Intake 711.25 ml  Output      0 ml  Net 711.25 ml   Weight change:  Exam:   General:  Pt is alert, follows commands appropriately, not in acute distress  HEENT: No icterus, No thrush, No neck mass, [place]/AT  Cardiovascular: RRR, S1/S2, no rubs, no gallops  Respiratory: CTA bilaterally, no wheezing, no crackles, no rhonchi  Abdomen: Soft/+BS, non tender, non distended,  no guarding  Extremities: No edema, No lymphangitis, No petechiae, No rashes, no synovitis  Data Reviewed: Basic Metabolic Panel:  Recent Labs Lab 01/07/15 1121 01/07/15 1833  NA 140 139  K 4.0 4.1  CL 104 103  CO2 25 26  GLUCOSE 100* 141*  BUN 30* 26*  CREATININE 1.49* 1.49*  CALCIUM 9.1 9.1   Liver Function Tests:  Recent Labs Lab 01/07/15 1833  AST 20  ALT 9*  ALKPHOS 87  BILITOT 0.3  PROT 6.3*  ALBUMIN 3.8   No results for input(s): LIPASE, AMYLASE in the last 168 hours. No results for input(s): AMMONIA in the last 168 hours. CBC:  Recent Labs Lab 01/07/15 1121 01/07/15 1833 01/08/15 0440  WBC 6.0 6.0 5.3  NEUTROABS  --  4.2 3.2  HGB 6.4* 6.2* 7.2*  HCT 20.0* 20.7* 23.3*  MCV 71.7* 73.4* 75.2*  PLT 237 222 182   Cardiac Enzymes: No results for input(s): CKTOTAL, CKMB, CKMBINDEX, TROPONINI in the last 168 hours. BNP: Invalid input(s): POCBNP CBG: No results for input(s): GLUCAP in the last 168 hours.  No results found for this or any previous visit (from the past 240 hour(s)).   Scheduled Meds: . sodium chloride   Intravenous Once  . amLODipine  2.5 mg Oral Daily  . donepezil  10 mg Oral QHS  . HYDROcodone-acetaminophen  1 tablet Oral BID  . metoprolol  50 mg Oral BID  . pantoprazole (PROTONIX) IV  40 mg Intravenous Q24H  . tamsulosin  0.4 mg Oral QPC supper   Continuous Infusions:    Michael Foulk, DO  [facility] Pager [phone]  If 7PM-7AM, please contact night-coverage [email or web address] Password [email or web address] 01/08/2015, 3:18 PM   LOS: 1 day

## 2015-01-08 NOTE — Consult Note (Signed)
Reason for Consult: Microcytic anemia Referring Physician: Hospital team  Michael Blanchard is an 81 y.o. male.  HPI: Patient seen and examined and his hospital computer chart and our office computer chart was reviewed and he has not had any previous GI workup and may have seen bright red blood one time about 6 months ago and does have some mild constipation which she takes stool softeners and is on Plavix but no additional aspirin or nonsteroidals and he has not had a CBC in our office by his primary care physician but he may have been told he was anemic years ago and specifically he has no GI complaints i.e. no swallowing problems reflux pain or change in bowel habits and has never had a colonoscopy and his family history is negative for any GI problems  Past Medical History  Diagnosis Date  . Hyperlipidemia   . Hypertension   . AVD (aortic valve disease)   . Coronary atherosclerosis   . Fatigue   . Trigeminal neuralgia     Past Surgical History  Procedure Laterality Date  . Cardiac catheterization  06/22/1999    SEVERE HYPOKINESIA OF THE MID TO DISTAL INFERIOR WALL AND APEX.MODERATE LEFT VENTRICULAR DYSFUNCTION WITH EF 45%  . Aortic valve replacement    . Coronary artery bypass graft  2001    Family History  Problem Relation Age of Onset  . Stroke Mother   . Aneurysm Mother   . Aneurysm Father   . Heart disease Brother     Social History:  reports that he quit smoking about 29 years ago. He has never used smokeless tobacco. He reports that he does not drink alcohol or use illicit drugs.  Allergies:  Allergies  Allergen Reactions  . Gabapentin     unknown  . Lipitor [Atorvastatin Calcium]     unknown  . Niaspan [Niacin]     unknown    Medications: I have reviewed the patient's current medications.  Results for orders placed or performed during the hospital encounter of 01/07/15 (from the past 48 hour(s))  Type and screen [place]     Status: None  (Preliminary result)   Collection Time: 01/07/15  5:53 PM  Result Value Ref Range   ABO/RH(D) O POS    Antibody Screen NEG    Sample Expiration 01/10/2015    Unit Number [id number]    Blood Component Type RED CELLS,LR    Unit division 00    Status of Unit ISSUED,FINAL    Transfusion Status OK TO TRANSFUSE    Crossmatch Result Compatible    Unit Number [id number]    Blood Component Type RBC LR PHER2    Unit division 00    Status of Unit ISSUED,FINAL    Transfusion Status OK TO TRANSFUSE    Crossmatch Result Compatible    Unit Number [id number]    Blood Component Type RED CELLS,LR    Unit division 00    Status of Unit ALLOCATED    Transfusion Status OK TO TRANSFUSE    Crossmatch Result Compatible    Unit Number [id number]    Blood Component Type RED CELLS,LR    Unit division 00    Status of Unit ALLOCATED    Transfusion Status OK TO TRANSFUSE    Crossmatch Result Compatible   ABO/Rh     Status: None   Collection Time: 01/07/15  5:53 PM  Result Value Ref Range   ABO/RH(D) O POS   Comprehensive metabolic panel  Status: Abnormal   Collection Time: 01/07/15  6:33 PM  Result Value Ref Range   Sodium 139 135 - 145 mmol/L   Potassium 4.1 3.5 - 5.1 mmol/L   Chloride 103 101 - 111 mmol/L   CO2 26 22 - 32 mmol/L   Glucose, Bld 141 (H) 65 - 99 mg/dL   BUN 26 (H) 6 - 20 mg/dL   Creatinine, Ser 1.49 (H) 0.61 - 1.24 mg/dL   Calcium 9.1 8.9 - 10.3 mg/dL   Total Protein 6.3 (L) 6.5 - 8.1 g/dL   Albumin 3.8 3.5 - 5.0 g/dL   AST 20 15 - 41 U/L   ALT 9 (L) 17 - 63 U/L   Alkaline Phosphatase 87 38 - 126 U/L   Total Bilirubin 0.3 0.3 - 1.2 mg/dL   GFR calc non Af Amer 40 (L) >60 mL/min   GFR calc Af Amer 47 (L) >60 mL/min    Comment: (NOTE) The eGFR has been calculated using the CKD EPI equation. This calculation has not been validated in all clinical situations. eGFR's persistently <60 mL/min signify possible Chronic Kidney Disease.    Anion gap 10 5 - 15  CBC  with Differential     Status: Abnormal   Collection Time: 01/07/15  6:33 PM  Result Value Ref Range   WBC 6.0 4.0 - 10.5 K/uL   RBC 2.82 (L) 4.22 - 5.81 MIL/uL   Hemoglobin 6.2 (LL) 13.0 - 17.0 g/dL    Comment: REPEATED TO VERIFY CRITICAL RESULT CALLED TO, READ BACK BY AND VERIFIED WITH: EASLEY,J RN '@[id number]'  BY GRINSTEAD,C 10.25.16    HCT 20.7 (L) 39.0 - 52.0 %   MCV 73.4 (L) 78.0 - 100.0 fL   MCH 22.0 (L) 26.0 - 34.0 pg   MCHC 30.0 30.0 - 36.0 g/dL   RDW 16.5 (H) 11.5 - 15.5 %   Platelets 222 150 - 400 K/uL   Neutrophils Relative % 69 %   Neutro Abs 4.2 1.7 - 7.7 K/uL   Lymphocytes Relative 19 %   Lymphs Abs 1.2 0.7 - 4.0 K/uL   Monocytes Relative 10 %   Monocytes Absolute 0.6 0.1 - 1.0 K/uL   Eosinophils Relative 1 %   Eosinophils Absolute 0.1 0.0 - 0.7 K/uL   Basophils Relative 0 %   Basophils Absolute 0 0.0 - 0.1 K/uL   Smear Review ELLIPTOCYTES     Comment: POLYCHROMASIA PRESENT  Protime-INR     Status: Abnormal   Collection Time: 01/07/15  6:33 PM  Result Value Ref Range   Prothrombin Time 16.1 (H) 11.6 - 15.2 seconds   INR 1.27 0.00 - 1.49  APTT     Status: Abnormal   Collection Time: 01/07/15  6:33 PM  Result Value Ref Range   aPTT 39 (H) 24 - 37 seconds    Comment:        IF BASELINE aPTT IS ELEVATED, SUGGEST PATIENT RISK ASSESSMENT BE USED TO DETERMINE APPROPRIATE ANTICOAGULANT THERAPY.   Prepare RBC     Status: None   Collection Time: 01/07/15  7:11 PM  Result Value Ref Range   Order Confirmation ORDER PROCESSED BY BLOOD BANK   Occult blood, poc device     Status: Abnormal   Collection Time: 01/07/15  8:58 PM  Result Value Ref Range   Fecal Occult Bld POSITIVE (A) NEGATIVE  Ferritin     Status: Abnormal   Collection Time: 01/07/15 10:49 PM  Result Value Ref Range   Ferritin 8 (L)  24 - 336 ng/mL  CBC WITH DIFFERENTIAL     Status: Abnormal   Collection Time: 01/08/15  4:40 AM  Result Value Ref Range   WBC 5.3 4.0 - 10.5 K/uL   RBC 3.10 (L) 4.22 - 5.81  MIL/uL   Hemoglobin 7.2 (L) 13.0 - 17.0 g/dL   HCT 23.3 (L) 39.0 - 52.0 %   MCV 75.2 (L) 78.0 - 100.0 fL   MCH 23.2 (L) 26.0 - 34.0 pg   MCHC 30.9 30.0 - 36.0 g/dL   RDW 17.1 (H) 11.5 - 15.5 %   Platelets 182 150 - 400 K/uL   Neutrophils Relative % 60 %   Neutro Abs 3.2 1.7 - 7.7 K/uL   Lymphocytes Relative 24 %   Lymphs Abs 1.3 0.7 - 4.0 K/uL   Monocytes Relative 12 %   Monocytes Absolute 0.6 0.1 - 1.0 K/uL   Eosinophils Relative 3 %   Eosinophils Absolute 0.1 0.0 - 0.7 K/uL   Basophils Relative 1 %   Basophils Absolute 0.0 0.0 - 0.1 K/uL  Prepare RBC     Status: None   Collection Time: 01/08/15  3:22 PM  Result Value Ref Range   Order Confirmation ORDER PROCESSED BY BLOOD BANK     Dg Chest 2 View  01/07/2015  CLINICAL DATA:  81 year old male with shortness of breath on exertion and chest tightness for the past 1-1/2 months. EXAM: CHEST  2 VIEW COMPARISON:  No priors. FINDINGS: Lung volumes are normal. No consolidative airspace disease. No pleural effusions. Mild bibasilar scarring. No pneumothorax. Well demarcated 7 mm nodule projecting over the lower left hemithorax, favored to represent a nipple shadow (not visualized on the lateral projection). No other suspicious appearing pulmonary nodules or masses are otherwise noted. No evidence of pulmonary edema. Heart size is within normal limits, but there is prominence of the left ventricular contour, which could indicate an underlying left ventricular hypertrophy. Status post median sternotomy for CABG, including LIMA, in addition to aortic valve replacement (a stented bioprosthesis is noted). Atherosclerosis in the thoracic aorta. IMPRESSION: 1. No radiographic evidence of acute cardiopulmonary disease. 2. 7 mm well demarcated nodular density projecting over the lower left hemithorax, favored to represent a prominent nipple shadow. Repeat standing PA and lateral chest radiographs are recommended in 3-4 weeks with nipple markers in place to  ensure the stability or resolution of this finding. 3. Atherosclerosis. Electronically Signed   By: Vinnie Langton [name]   On: 01/07/2015 19:14    ROS negative except above Blood pressure 135/57, pulse 55, temperature 97.8 F (36.6 C), temperature source Oral, resp. rate 18, weight 72.2 kg (159 lb 2.8 oz), SpO2 98 %. Physical Exam Vital signs stable afebrile no acute distress specifically abdomen is soft nontender good bowel sounds lungs are clear regular rate and rhythm labs reviewed Assessment/Plan: Microcytic anemia in a patient on plavix Plan: Based on his age will proceed with a oral contrast CT only to rule out anything big or bad and if no significant obvious mass we'll probably proceed with an endoscopy first which was discussed with the patient but will wait on above [facility] E 01/08/2015, 4:36 PM

## 2015-01-09 DIAGNOSIS — D509 Iron deficiency anemia, unspecified: Secondary | ICD-10-CM | POA: Insufficient documentation

## 2015-01-09 LAB — CBC
HEMATOCRIT: 27.3 % — AB (ref 39.0–52.0)
Hemoglobin: 8.8 g/dL — ABNORMAL LOW (ref 13.0–17.0)
MCH: 24.6 pg — ABNORMAL LOW (ref 26.0–34.0)
MCHC: 32.2 g/dL (ref 30.0–36.0)
MCV: 76.5 fL — AB (ref 78.0–100.0)
Platelets: 170 10*3/uL (ref 150–400)
RBC: 3.57 MIL/uL — AB (ref 4.22–5.81)
RDW: 17.5 % — ABNORMAL HIGH (ref 11.5–15.5)
WBC: 5.6 10*3/uL (ref 4.0–10.5)

## 2015-01-09 MED ORDER — SODIUM CHLORIDE 0.9 % IV SOLN
INTRAVENOUS | Status: DC
  Administered 2015-01-09: 20:00:00 via INTRAVENOUS

## 2015-01-09 NOTE — Progress Notes (Signed)
Michael Blanchard 4:44 PM  Subjective: Patient without complaints and wants to eat and we reviewed his CT and discussed with his son as well  Objective: Vital signs stable afebrile no acute distress abdomen is soft nontender hemoglobin increased  Assessment: Symptomatic anemia guaiac positivity  Plan: The risks benefits methods of endoscopy was discussed with the patient and his son and then if nondiagnostic will need a colonoscopy either as an inpatient or outpatient and they will be thinking about that and that procedure was also discussed and will allow to eat this evening but then nothing by mouth after midnight for the procedure  [facility] E  Pager [phone] After 5PM or if no answer call [phone]

## 2015-01-09 NOTE — Progress Notes (Signed)
Utilization review completed. Martavious Hartel, RN, BSN. 

## 2015-01-09 NOTE — Progress Notes (Signed)
PROGRESS NOTE  Michael Blanchard [facility] IPJ:[id number] DOB: [birth year] DOA: 01/07/2015 PCP: [name], MD  Brief history 81 year old male with a history of CAD status post CABG, bioprosthetic aortic valve replacement, hypertension presented with symptomatic anemia. For the past month, the patient has been complaining of exertional chest discomfort and shortness of breath. He denies any syncope, nausea, vomiting, diarrhea. There is another medication or melena. He went to see his cardiologist on 01/07/2015. Blood work was obtained and revealed a hemoglobin of 6.4. As result, the patient was told to come to the emergency department for further evaluation. the patient denies any NSAID use but does use a Flector patch. Assessment/Plan: Symptomatically anemia/chronic blood loss anemia  -09/18/2012 hemoglobin 13.1  -01/07/2015 hemoglobin 6.4  -transfused 2 units PRBC -01/08/15--transfuse another 2 units (4 total) -iron studies--ferritin 10, iron sat 21% -FOBT positive -pt has never had EGD or colonoscopy -[facility]-appreciate consult -continue clears for now pending GI evaluations -01/08/2015 CT abdomen and pelvis diffuse diverticulosis sigmoid colon, enlarged prostate, ASVD Dilated appendix on CT -Absolutely no abdominal pain -Afebrile hemodynamically stable -Doubt acute appendicitis CKD stage III  -Baseline creatinine 1.2-1.4  CAD with history of CABG -Exertional chest discomfort likely due to the patient's anemia--improved after RBC transfusion -Repeat EKG -01/07/2015 echo shows EF 55-60%, grade 2 DD, no WMA -Continue metoprolol -Hold Plavix pending GI eval S/P bioprosthetic AVR -Echo--no obstruction Hypertension -Continue metoprolol and amlodipine -hold Hyzaar and monitor hyperlipidemia  -Continue statin  Mild Cognitive Impairment -continue Aricept   Family Communication: Son udpdated at beside Disposition Plan: Home when medically  stable   Procedures/Studies: Ct Abdomen Pelvis Wo Contrast  01/09/2015  CLINICAL DATA:  Hypochromic microcytic anemia, acute onset. Initial encounter. EXAM: CT ABDOMEN AND PELVIS WITHOUT CONTRAST TECHNIQUE: Multidetector CT imaging of the abdomen and pelvis was performed following the standard protocol without IV contrast. COMPARISON:  None. FINDINGS: Trace bilateral pleural effusions are noted, with mild left basilar atelectasis. Scattered coronary artery calcifications are seen. The liver and spleen are unremarkable in appearance. The gallbladder is within normal limits. The pancreas and adrenal glands are unremarkable. The kidneys are unremarkable in appearance. There is no evidence of hydronephrosis. No renal or ureteral stones are seen. Mild nonspecific perinephric stranding is noted bilaterally. Trace free fluid within the pelvis is nonspecific. The small bowel is unremarkable in appearance. The stomach is within normal limits. No acute vascular abnormalities are seen. Diffuse calcification is noted along the abdominal aorta and its branches, including the renal arteries and superior mesenteric artery. The appendix is dilated, measuring up to 1.0 cm, with mild surrounding soft tissue inflammation. However, the patient is asymptomatic, per clinical correlation. This may reflect the patient's baseline. Diffuse diverticulosis is noted along the sigmoid colon, without evidence of diverticulitis. The colon is otherwise unremarkable. The bladder is mildly distended and grossly unremarkable in appearance. The prostate is enlarged, measuring 5.2 cm in transverse dimension. No inguinal lymphadenopathy is seen. No acute osseous abnormalities are identified. IMPRESSION: 1. No evidence of hematoma within the abdomen or pelvis. 2. Diffuse diverticulosis along the sigmoid colon, without evidence of diverticulitis. 3. Dilated appendix, measuring 1.0 cm, with mild surrounding soft tissue inflammation. However, the  patient is asymptomatic, per clinical correlation. This may reflect the patient's baseline. 4. Trace bilateral pleural effusions, with mild left basilar atelectasis. 5. Scattered coronary artery calcification noted. 6. Trace free fluid in the pelvis is nonspecific. 7. Diffuse calcification along the abdominal aorta and its  branches, including the renal arteries and superior mesenteric artery. 8. Enlarged prostate noted. Electronically Signed   By: Garald Balding [name]   On: 01/09/2015 00:08   Dg Chest 2 View  01/07/2015  CLINICAL DATA:  81 year old male with shortness of breath on exertion and chest tightness for the past 1-1/2 months. EXAM: CHEST  2 VIEW COMPARISON:  No priors. FINDINGS: Lung volumes are normal. No consolidative airspace disease. No pleural effusions. Mild bibasilar scarring. No pneumothorax. Well demarcated 7 mm nodule projecting over the lower left hemithorax, favored to represent a nipple shadow (not visualized on the lateral projection). No other suspicious appearing pulmonary nodules or masses are otherwise noted. No evidence of pulmonary edema. Heart size is within normal limits, but there is prominence of the left ventricular contour, which could indicate an underlying left ventricular hypertrophy. Status post median sternotomy for CABG, including LIMA, in addition to aortic valve replacement (a stented bioprosthesis is noted). Atherosclerosis in the thoracic aorta. IMPRESSION: 1. No radiographic evidence of acute cardiopulmonary disease. 2. 7 mm well demarcated nodular density projecting over the lower left hemithorax, favored to represent a prominent nipple shadow. Repeat standing PA and lateral chest radiographs are recommended in 3-4 weeks with nipple markers in place to ensure the stability or resolution of this finding. 3. Atherosclerosis. Electronically Signed   By: Vinnie Langton [name]   On: 01/07/2015 19:14         Subjective: Patient denies fevers, chills, headache,  chest pain, dyspnea, nausea, vomiting, diarrhea, abdominal pain, dysuria, hematuria.  He is feeling better. He does not have nearly as much dyspnea with exertion. Denies any hematochezia or melena.   Objective: Filed Vitals:   01/09/15 0140 01/09/15 0412 01/09/15 0425 01/09/15 0925  BP: 149/42 153/51 146/62 134/40  Pulse: 54 58 59 56  Temp: 98 F (36.7 C) 98 F (36.7 C) 98 F (36.7 C) 98.2 F (36.8 C)  TempSrc: Oral Oral  Oral  Resp: 18 18 18 18   Weight:      SpO2:  98% 97% 98%    Intake/Output Summary (Last 24 hours) at 01/09/15 1529 Last data filed at 01/09/15 0925  Gross per 24 hour  Intake   1505 ml  Output    525 ml  Net    980 ml   Weight change:  Exam:   General:  Pt is alert, follows commands appropriately, not in acute distress  HEENT: No icterus, No thrush, No neck mass, [place]/AT  Cardiovascular: RRR, S1/S2, no rubs, no gallops  Respiratory: CTA bilaterally, no wheezing, no crackles, no rhonchi  Abdomen: Soft/+BS, non tender, non distended, no guarding  Extremities: No edema, No lymphangitis, No petechiae, No rashes, no synovitis  Data Reviewed: Basic Metabolic Panel:  Recent Labs Lab 01/07/15 1121 01/07/15 1833  NA 140 139  K 4.0 4.1  CL 104 103  CO2 25 26  GLUCOSE 100* 141*  BUN 30* 26*  CREATININE 1.49* 1.49*  CALCIUM 9.1 9.1   Liver Function Tests:  Recent Labs Lab 01/07/15 1833  AST 20  ALT 9*  ALKPHOS 87  BILITOT 0.3  PROT 6.3*  ALBUMIN 3.8   No results for input(s): LIPASE, AMYLASE in the last 168 hours. No results for input(s): AMMONIA in the last 168 hours. CBC:  Recent Labs Lab 01/07/15 1121 01/07/15 1833 01/08/15 0440 01/09/15 0424  WBC 6.0 6.0 5.3 5.6  NEUTROABS  --  4.2 3.2  --   HGB 6.4* 6.2* 7.2* 8.8*  HCT 20.0* 20.7*  23.3* 27.3*  MCV 71.7* 73.4* 75.2* 76.5*  PLT 237 222 182 170   Cardiac Enzymes: No results for input(s): CKTOTAL, CKMB, CKMBINDEX, TROPONINI in the last 168 hours. BNP: Invalid input(s):  POCBNP CBG: No results for input(s): GLUCAP in the last 168 hours.  No results found for this or any previous visit (from the past 240 hour(s)).   Scheduled Meds: . amLODipine  2.5 mg Oral Daily  . donepezil  10 mg Oral QHS  . HYDROcodone-acetaminophen  1 tablet Oral BID  . metoprolol  50 mg Oral BID  . pantoprazole (PROTONIX) IV  40 mg Intravenous Q24H  . tamsulosin  0.4 mg Oral QPC supper   Continuous Infusions:    Wanita Derenzo, DO  [facility] Pager [phone]  If 7PM-7AM, please contact night-coverage [email or web address] Password [email or web address] 01/09/2015, 3:29 PM   LOS: 2 days

## 2015-01-10 ENCOUNTER — Inpatient Hospital Stay (HOSPITAL_COMMUNITY): Payer: Medicare Other | Admitting: Anesthesiology

## 2015-01-10 ENCOUNTER — Encounter (HOSPITAL_COMMUNITY): Payer: Self-pay

## 2015-01-10 ENCOUNTER — Encounter (HOSPITAL_COMMUNITY)
Admission: EM | Disposition: A | Discharge status: Home / Self Care | Payer: Self-pay | Source: Home / Self Care | Attending: Internal Medicine

## 2015-01-10 HISTORY — PX: ESOPHAGOGASTRODUODENOSCOPY: SHX5428

## 2015-01-10 LAB — TYPE AND SCREEN
ABO/RH(D): O POS
Antibody Screen: NEGATIVE
UNIT DIVISION: 0
UNIT DIVISION: 0
Unit division: 0
Unit division: 0

## 2015-01-10 LAB — BASIC METABOLIC PANEL
Anion gap: 10 (ref 5–15)
BUN: 18 mg/dL (ref 6–20)
CALCIUM: 9.2 mg/dL (ref 8.9–10.3)
CO2: 27 mmol/L (ref 22–32)
CREATININE: 1.39 mg/dL — AB (ref 0.61–1.24)
Chloride: 103 mmol/L (ref 101–111)
GFR calc Af Amer: 51 mL/min — ABNORMAL LOW (ref >60)
GFR, EST NON AFRICAN AMERICAN: 44 mL/min — AB (ref >60)
GLUCOSE: 88 mg/dL (ref 65–99)
POTASSIUM: 3.8 mmol/L (ref 3.5–5.1)
SODIUM: 140 mmol/L (ref 135–145)

## 2015-01-10 LAB — CBC
HCT: 30.7 % — ABNORMAL LOW (ref 39.0–52.0)
Hemoglobin: 9.7 g/dL — ABNORMAL LOW (ref 13.0–17.0)
MCH: 24 pg — AB (ref 26.0–34.0)
MCHC: 31.6 g/dL (ref 30.0–36.0)
MCV: 76 fL — ABNORMAL LOW (ref 78.0–100.0)
PLATELETS: 207 10*3/uL (ref 150–400)
RBC: 4.04 MIL/uL — AB (ref 4.22–5.81)
RDW: 17.7 % — AB (ref 11.5–15.5)
WBC: 6 10*3/uL (ref 4.0–10.5)

## 2015-01-10 SURGERY — EGD (ESOPHAGOGASTRODUODENOSCOPY)
Anesthesia: Monitor Anesthesia Care

## 2015-01-10 MED ORDER — AMLODIPINE BESYLATE 5 MG PO TABS
5.0000 mg | ORAL_TABLET | Freq: Every day | ORAL | Status: DC
  Administered 2015-01-11 – 2015-01-13 (×2): 5 mg via ORAL
  Filled 2015-01-10 (×3): qty 1

## 2015-01-10 MED ORDER — BUTAMBEN-TETRACAINE-BENZOCAINE 2-2-14 % EX AERO
INHALATION_SPRAY | CUTANEOUS | Status: DC | PRN
  Administered 2015-01-10: 2 via TOPICAL

## 2015-01-10 MED ORDER — PEG 3350-KCL-NA BICARB-NACL 420 G PO SOLR
4000.0000 mL | Freq: Once | ORAL | Status: AC
  Administered 2015-01-10: 4000 mL via ORAL
  Filled 2015-01-10: qty 4000

## 2015-01-10 MED ORDER — LIDOCAINE HCL (CARDIAC) 20 MG/ML IV SOLN
INTRAVENOUS | Status: DC | PRN
  Administered 2015-01-10: 50 mg via INTRAVENOUS

## 2015-01-10 MED ORDER — PROPOFOL 10 MG/ML IV BOLUS
INTRAVENOUS | Status: DC | PRN
  Administered 2015-01-10: 20 mg via INTRAVENOUS

## 2015-01-10 MED ORDER — SODIUM CHLORIDE 0.9 % IV SOLN
INTRAVENOUS | Status: DC
Start: 2015-01-10 — End: 2015-01-13
  Administered 2015-01-11: 1 mL via INTRAVENOUS

## 2015-01-10 NOTE — Progress Notes (Signed)
PROGRESS NOTE  [facility] ERD:[id number] DOB: 01/13/1926 DOA: 01/07/2015 PCP: Michael Hams, MD  Brief history 81 year old male with a history of CAD status post CABG, bioprosthetic aortic valve replacement, hypertension presented with symptomatic anemia. For the past month, the patient has been complaining of exertional chest discomfort and shortness of breath. He denies any syncope, nausea, vomiting, diarrhea. There is another medication or melena. He went to see his cardiologist on 01/07/2015. Blood work was obtained and revealed a hemoglobin of 6.4. As result, the patient was told to come to the emergency department for further evaluation. the patient denies any NSAID use but does use a Flector patch. Assessment/Plan: Symptomatically anemia/chronic blood loss anemia  -09/18/2012 hemoglobin 13.1  -01/07/2015 hemoglobin 6.4  -10/25-transfused 2 units PRBC -01/08/15--transfuse another 2 units (4 total) -iron studies--ferritin 10, iron sat 21% -FOBT positive -pt has never had EGD or colonoscopy -[facility]-appreciate consult -continue clears for now pending GI evaluations -01/08/2015 CT abdomen and pelvis diffuse diverticulosis sigmoid colon, enlarged prostate, ASVD -01/10/2015 EGD--negative except small hiatus hernia -plans for colonscopy on 10/29 Dilated appendix on CT -Absolutely no abdominal pain -Afebrile hemodynamically stable -Doubt acute appendicitis CKD stage III  -Baseline creatinine 1.2-1.4  CAD with history of CABG -Exertional chest discomfort likely due to the patient's anemia--improved after RBC transfusion -Repeat EKG -01/07/2015 echo shows EF 55-60%, grade 2 DD, no WMA -Continue metoprolol -Hold Plavix pending GI eval S/P bioprosthetic AVR -Echo--no obstruction Hypertension -Continue metoprolol and amlodipine -increase amlodipine to 5 mg daily -hold Hyzaar and monitor hyperlipidemia  -Continue statin  Mild Cognitive  Impairment -continue Aricept   Family Communication: Son and daughter udpdated at beside Disposition Plan: Home when medically stable      Procedures/Studies: Ct Abdomen Pelvis Wo Contrast  01/09/2015  CLINICAL DATA:  Hypochromic microcytic anemia, acute onset. Initial encounter. EXAM: CT ABDOMEN AND PELVIS WITHOUT CONTRAST TECHNIQUE: Multidetector CT imaging of the abdomen and pelvis was performed following the standard protocol without IV contrast. COMPARISON:  None. FINDINGS: Trace bilateral pleural effusions are noted, with mild left basilar atelectasis. Scattered coronary artery calcifications are seen. The liver and spleen are unremarkable in appearance. The gallbladder is within normal limits. The pancreas and adrenal glands are unremarkable. The kidneys are unremarkable in appearance. There is no evidence of hydronephrosis. No renal or ureteral stones are seen. Mild nonspecific perinephric stranding is noted bilaterally. Trace free fluid within the pelvis is nonspecific. The small bowel is unremarkable in appearance. The stomach is within normal limits. No acute vascular abnormalities are seen. Diffuse calcification is noted along the abdominal aorta and its branches, including the renal arteries and superior mesenteric artery. The appendix is dilated, measuring up to 1.0 cm, with mild surrounding soft tissue inflammation. However, the patient is asymptomatic, per clinical correlation. This may reflect the patient's baseline. Diffuse diverticulosis is noted along the sigmoid colon, without evidence of diverticulitis. The colon is otherwise unremarkable. The bladder is mildly distended and grossly unremarkable in appearance. The prostate is enlarged, measuring 5.2 cm in transverse dimension. No inguinal lymphadenopathy is seen. No acute osseous abnormalities are identified. IMPRESSION: 1. No evidence of hematoma within the abdomen or pelvis. 2. Diffuse diverticulosis along the sigmoid colon,  without evidence of diverticulitis. 3. Dilated appendix, measuring 1.0 cm, with mild surrounding soft tissue inflammation. However, the patient is asymptomatic, per clinical correlation. This may reflect the patient's baseline. 4. Trace bilateral pleural effusions, with mild left basilar atelectasis. 5. Scattered  coronary artery calcification noted. 6. Trace free fluid in the pelvis is nonspecific. 7. Diffuse calcification along the abdominal aorta and its branches, including the renal arteries and superior mesenteric artery. 8. Enlarged prostate noted. Electronically Signed   By: Garald Balding [name]   On: 01/09/2015 00:08   Dg Chest 2 View  01/07/2015  CLINICAL DATA:  81 year old male with shortness of breath on exertion and chest tightness for the past 1-1/2 months. EXAM: CHEST  2 VIEW COMPARISON:  No priors. FINDINGS: Lung volumes are normal. No consolidative airspace disease. No pleural effusions. Mild bibasilar scarring. No pneumothorax. Well demarcated 7 mm nodule projecting over the lower left hemithorax, favored to represent a nipple shadow (not visualized on the lateral projection). No other suspicious appearing pulmonary nodules or masses are otherwise noted. No evidence of pulmonary edema. Heart size is within normal limits, but there is prominence of the left ventricular contour, which could indicate an underlying left ventricular hypertrophy. Status post median sternotomy for CABG, including LIMA, in addition to aortic valve replacement (a stented bioprosthesis is noted). Atherosclerosis in the thoracic aorta. IMPRESSION: 1. No radiographic evidence of acute cardiopulmonary disease. 2. 7 mm well demarcated nodular density projecting over the lower left hemithorax, favored to represent a prominent nipple shadow. Repeat standing PA and lateral chest radiographs are recommended in 3-4 weeks with nipple markers in place to ensure the stability or resolution of this finding. 3. Atherosclerosis.  Electronically Signed   By: Vinnie Langton [name]   On: 01/07/2015 19:14         Subjective: Patient denies fevers, chills, headache, chest pain, dyspnea, nausea, vomiting, diarrhea, abdominal pain, dysuria, hematuria   Objective: Filed Vitals:   01/10/15 1145 01/10/15 1257 01/10/15 1315 01/10/15 1317  BP: 206/64 122/34 180/49 159/63  Pulse: 60 52 53 53  Temp: 98.1 F (36.7 C) 97.8 F (36.6 C)    TempSrc: Oral Oral    Resp: 16 21 17 14   Height: 5\' 9"  (1.753 m)     Weight: 72.122 kg (159 lb)     SpO2: 96% 100% 96% 100%    Intake/Output Summary (Last 24 hours) at 01/10/15 1721 Last data filed at 01/10/15 1026  Gross per 24 hour  Intake      0 ml  Output    600 ml  Net   -600 ml   Weight change:  Exam:   General:  Pt is alert, follows commands appropriately, not in acute distress  HEENT: No icterus, No thrush, No neck mass, [place]/AT  Cardiovascular: RRR, S1/S2, no rubs, no gallops  Respiratory: CTA bilaterally, no wheezing, no crackles, no rhonchi  Abdomen: Soft/+BS, non tender, non distended, no guarding  Extremities: No edema, No lymphangitis, No petechiae, No rashes, no synovitis  Data Reviewed: Basic Metabolic Panel:  Recent Labs Lab 01/07/15 1121 01/07/15 1833 01/10/15 0517  NA 140 139 140  K 4.0 4.1 3.8  CL 104 103 103  CO2 25 26 27   GLUCOSE 100* 141* 88  BUN 30* 26* 18  CREATININE 1.49* 1.49* 1.39*  CALCIUM 9.1 9.1 9.2   Liver Function Tests:  Recent Labs Lab 01/07/15 1833  AST 20  ALT 9*  ALKPHOS 87  BILITOT 0.3  PROT 6.3*  ALBUMIN 3.8   No results for input(s): LIPASE, AMYLASE in the last 168 hours. No results for input(s): AMMONIA in the last 168 hours. CBC:  Recent Labs Lab 01/07/15 1121 01/07/15 1833 01/08/15 0440 01/09/15 0424 01/10/15 0517  WBC 6.0 6.0  5.3 5.6 6.0  NEUTROABS  --  4.2 3.2  --   --   HGB 6.4* 6.2* 7.2* 8.8* 9.7*  HCT 20.0* 20.7* 23.3* 27.3* 30.7*  MCV 71.7* 73.4* 75.2* 76.5* 76.0*  PLT 237 222 182  170 207   Cardiac Enzymes: No results for input(s): CKTOTAL, CKMB, CKMBINDEX, TROPONINI in the last 168 hours. BNP: Invalid input(s): POCBNP CBG: No results for input(s): GLUCAP in the last 168 hours.  No results found for this or any previous visit (from the past 240 hour(s)).   Scheduled Meds: . amLODipine  2.5 mg Oral Daily  . donepezil  10 mg Oral QHS  . HYDROcodone-acetaminophen  1 tablet Oral BID  . metoprolol  50 mg Oral BID  . pantoprazole (PROTONIX) IV  40 mg Intravenous Q24H  . polyethylene glycol-electrolytes  4,000 mL Oral Once  . tamsulosin  0.4 mg Oral QPC supper   Continuous Infusions: . sodium chloride 20 mL/hr at 01/10/15 1426     Lachlan Mckim, DO  [facility] Pager [phone]  If 7PM-7AM, please contact night-coverage [email or web address] Password [email or web address] 01/10/2015, 5:21 PM   LOS: 3 days

## 2015-01-10 NOTE — Anesthesia Postprocedure Evaluation (Signed)
  Anesthesia Post-op Note  Patient: Michael Blanchard  Procedure(s) Performed: Procedure(s): ESOPHAGOGASTRODUODENOSCOPY (EGD) (N/A)  Patient Location: Endoscopy Unit  Anesthesia Type:MAC  Level of Consciousness: awake, alert , oriented and patient cooperative  Airway and Oxygen Therapy: Patient Spontanous Breathing  Post-op Pain: none  Post-op Assessment: Post-op Vital signs reviewed, Patient's Cardiovascular Status Stable, Respiratory Function Stable, Patent Airway, No signs of Nausea or vomiting and Pain level controlled              Post-op Vital Signs: Reviewed and stable  Last Vitals:  Filed Vitals:   01/10/15 1317  BP: 159/63  Pulse: 53  Temp:   Resp: 14    Complications: No apparent anesthesia complications

## 2015-01-10 NOTE — Care Management Important Message (Signed)
Important Message  Patient Details  Name: Michael Blanchard MRN: [id number] Date of Birth: 10-02-1925   Medicare Important Message Given:  Yes-second notification given    Delorse Lek 01/10/2015, 1:33 PM

## 2015-01-10 NOTE — Anesthesia Preprocedure Evaluation (Addendum)
Anesthesia Evaluation  Patient identified by MRN, date of birth, ID band Patient awake    Reviewed: Allergy & Precautions, H&P , NPO status , Patient's Chart, lab work & pertinent test results  History of Anesthesia Complications Negative for: history of anesthetic complications  Airway Mallampati: III  TM Distance: >3 FB Neck ROM: full    Dental  (+) Edentulous Upper, Edentulous Lower   Pulmonary neg pulmonary ROS, former smoker,    Pulmonary exam normal breath sounds clear to auscultation       Cardiovascular Exercise Tolerance: Poor hypertension, Pt. on medications + CAD and + CABG  Normal cardiovascular exam Rhythm:regular Rate:Normal     Neuro/Psych  Neuromuscular disease    GI/Hepatic negative GI ROS, Neg liver ROS,   Endo/Other  negative endocrine ROS  Renal/GU CRFRenal disease     Musculoskeletal  (+) Arthritis ,   Abdominal   Peds  Hematology  (+) anemia ,   Anesthesia Other Findings   Reproductive/Obstetrics negative OB ROS                             Anesthesia Physical Anesthesia Plan  ASA: III  Anesthesia Plan: MAC   Post-op Pain Management:    Induction: Intravenous  Airway Management Planned: Simple Face Mask  Additional Equipment:   Intra-op Plan:   Post-operative Plan:   Informed Consent: I have reviewed the patients History and Physical, chart, labs and discussed the procedure including the risks, benefits and alternatives for the proposed anesthesia with the patient or authorized representative who has indicated his/her understanding and acceptance.   Dental Advisory Given  Plan Discussed with: Anesthesiologist, CRNA and Surgeon  Anesthesia Plan Comments:         Anesthesia Quick Evaluation

## 2015-01-10 NOTE — Progress Notes (Signed)
Michael Blanchard 12:39 PM  Subjective: Patient doing well without any problems  Objective: Vital signs stable afebrile no acute distress exam please see preassessment evaluation hemoglobin increased  Assessment: Guaiac positive microcytic anemia in patient on Plavix  Plan: Okay to proceed with endoscopy with anesthesia assistance with further workup and plans and recommendations pending those findings  [facility] E  Pager [phone] After 5PM or if no answer call [phone]

## 2015-01-10 NOTE — Op Note (Signed)
[facility] [place], [address]   ENDOSCOPY PROCEDURE REPORT  PATIENT: Marquise, Wicke  MR#: [id number] BIRTHDATE: [birth year] , 89  yrs. old GENDER: male ENDOSCOPIST: Clarene Essex, MD REFERRED BY: PROCEDURE DATE:  2015/01/15 PROCEDURE:  EGD, diagnostic ASA CLASS:     Class III INDICATIONS:  anemia and hemocult positive stool. MEDICATIONS: Propofol 100 mg IV and Lidocaine 50 mg IV TOPICAL ANESTHETIC: Cetacaine Spray  DESCRIPTION OF PROCEDURE: After the risks benefits and alternatives of the procedure were thoroughly explained, informed consent was obtained.  The Pentax Gastroscope [id number] endoscope was introduced through the mouth and advanced to the second portion of the duodenum , Without limitations.  The instrument was slowly withdrawn as the mucosa was fully examined. Estimated blood loss is zero unless otherwise noted in this procedure report.    The findings are recorded below       Retroflexed views revealed a hiatal hernia.     The scope was then withdrawn from the patient and the procedure completed.  COMPLICATIONS: There were no immediate complications.  ENDOSCOPIC IMPRESSION: 1. Small hiatal hernia 2. Otherwise within normal limits EGD without signs of bleeding or at risk lesions  RECOMMENDATIONS: colonoscopy either as inpatient or outpatient  REPEAT EXAM: as needed  eSigned:  Clarene Essex, MD 01-15-15 12:58 PM    CC:  CPT CODES: ICD CODES:  The ICD and CPT codes recommended by this software are interpretations from the data that the clinical staff has captured with the software.  The verification of the translation of this report to the ICD and CPT codes and modifiers is the sole responsibility of the health care institution and practicing physician where this report was generated.  [employer] will not be held responsible for the validity of the ICD and CPT codes included on this report.   AMA assumes no liability for data contained or not contained herein. CPT is a Designer, television/film set of the [employer].  PATIENT NAME:  Kelon, Easom MR#: [id number]

## 2015-01-10 NOTE — Transfer of Care (Signed)
Immediate Anesthesia Transfer of Care Note  Patient: Michael Blanchard  Procedure(s) Performed: Procedure(s): ESOPHAGOGASTRODUODENOSCOPY (EGD) (N/A)  Patient Location: Endoscopy Unit  Anesthesia Type:MAC  Level of Consciousness: awake, alert  and oriented  Airway & Oxygen Therapy: Patient Spontanous Breathing and Patient connected to nasal cannula oxygen  Post-op Assessment: Report given to RN and Post -op Vital signs reviewed and stable  Post vital signs: Reviewed and stable  Last Vitals:  Filed Vitals:   01/10/15 1145  BP: 206/64  Pulse: 60  Temp: 36.7 C  Resp: 16    Complications: No apparent anesthesia complications

## 2015-01-11 ENCOUNTER — Encounter (HOSPITAL_COMMUNITY)
Admission: EM | Disposition: A | Discharge status: Home / Self Care | Payer: Self-pay | Source: Home / Self Care | Attending: Internal Medicine

## 2015-01-11 LAB — CBC
HEMATOCRIT: 34.7 % — AB (ref 39.0–52.0)
Hemoglobin: 10.8 g/dL — ABNORMAL LOW (ref 13.0–17.0)
MCH: 24.3 pg — ABNORMAL LOW (ref 26.0–34.0)
MCHC: 31.1 g/dL (ref 30.0–36.0)
MCV: 78.2 fL (ref 78.0–100.0)
PLATELETS: 227 10*3/uL (ref 150–400)
RBC: 4.44 MIL/uL (ref 4.22–5.81)
RDW: 18.4 % — AB (ref 11.5–15.5)
WBC: 7 10*3/uL (ref 4.0–10.5)

## 2015-01-11 SURGERY — CANCELLED PROCEDURE

## 2015-01-11 MED ORDER — DIPHENHYDRAMINE HCL 50 MG/ML IJ SOLN
INTRAMUSCULAR | Status: AC
  Filled 2015-01-11: qty 1

## 2015-01-11 MED ORDER — MIDAZOLAM HCL 5 MG/ML IJ SOLN
INTRAMUSCULAR | Status: AC
  Filled 2015-01-11: qty 2

## 2015-01-11 MED ORDER — SODIUM CHLORIDE 0.9 % IV SOLN: INTRAVENOUS | Status: DC

## 2015-01-11 MED ORDER — FENTANYL CITRATE (PF) 100 MCG/2ML IJ SOLN
INTRAMUSCULAR | Status: AC
  Filled 2015-01-11: qty 2

## 2015-01-11 NOTE — Progress Notes (Signed)
PROGRESS NOTE  [facility] TTS:[id number] DOB: 1925-10-14 DOA: 01/07/2015 PCP: [name], MD  Brief history 81 year old male with a history of CAD status post CABG, bioprosthetic aortic valve replacement, hypertension presented with symptomatic anemia. For the past month, the patient has been complaining of exertional chest discomfort and shortness of breath. He denies any syncope, nausea, vomiting, diarrhea. There is another medication or melena. He went to see his cardiologist on 01/07/2015. Blood work was obtained and revealed a hemoglobin of 6.4. As result, the patient was told to come to the emergency department for further evaluation. the patient denies any NSAID use but does use a Flector patch. Assessment/Plan: Symptomatically anemia/chronic blood loss anemia  -09/18/2012 hemoglobin 13.1  -01/07/2015 hemoglobin 6.4  -10/25-transfused 2 units PRBC -01/08/15--transfuse another 2 units (4 total) -iron studies--ferritin 10, iron sat 21% -FOBT positive -pt has never had EGD or colonoscopy -[facility]-appreciate consult -01/08/2015 CT abdomen and pelvis diffuse diverticulosis sigmoid colon, enlarged prostate, ASVD -01/10/2015 EGD--negative except small hiatus hernia -01/11/15--didn't complete prep -plans for colonscopy on 10/30 Dilated appendix on CT -Absolutely no abdominal pain -Afebrile hemodynamically stable -Doubt acute appendicitis CKD stage III  -Baseline creatinine 1.2-1.4  CAD with history of CABG -Exertional chest discomfort likely due to the patient's anemia--improved after RBC transfusion -01/07/2015 echo shows EF 55-60%, grade 2 DD, no WMA -Continue metoprolol -Hold Plavix pending GI eval S/P bioprosthetic AVR -Echo--no obstruction Hypertension -Continue metoprolol and amlodipine -increase amlodipine to 5 mg daily -hold Hyzaar and monitor hyperlipidemia  -Continue statin  Mild Cognitive Impairment -continue Aricept   Family  Communication: Son udpdated at beside 10/29 Disposition Plan: Home when medically stable    Procedures/Studies: Ct Abdomen Pelvis Wo Contrast  01/09/2015  CLINICAL DATA:  Hypochromic microcytic anemia, acute onset. Initial encounter. EXAM: CT ABDOMEN AND PELVIS WITHOUT CONTRAST TECHNIQUE: Multidetector CT imaging of the abdomen and pelvis was performed following the standard protocol without IV contrast. COMPARISON:  None. FINDINGS: Trace bilateral pleural effusions are noted, with mild left basilar atelectasis. Scattered coronary artery calcifications are seen. The liver and spleen are unremarkable in appearance. The gallbladder is within normal limits. The pancreas and adrenal glands are unremarkable. The kidneys are unremarkable in appearance. There is no evidence of hydronephrosis. No renal or ureteral stones are seen. Mild nonspecific perinephric stranding is noted bilaterally. Trace free fluid within the pelvis is nonspecific. The small bowel is unremarkable in appearance. The stomach is within normal limits. No acute vascular abnormalities are seen. Diffuse calcification is noted along the abdominal aorta and its branches, including the renal arteries and superior mesenteric artery. The appendix is dilated, measuring up to 1.0 cm, with mild surrounding soft tissue inflammation. However, the patient is asymptomatic, per clinical correlation. This may reflect the patient's baseline. Diffuse diverticulosis is noted along the sigmoid colon, without evidence of diverticulitis. The colon is otherwise unremarkable. The bladder is mildly distended and grossly unremarkable in appearance. The prostate is enlarged, measuring 5.2 cm in transverse dimension. No inguinal lymphadenopathy is seen. No acute osseous abnormalities are identified. IMPRESSION: 1. No evidence of hematoma within the abdomen or pelvis. 2. Diffuse diverticulosis along the sigmoid colon, without evidence of diverticulitis. 3. Dilated  appendix, measuring 1.0 cm, with mild surrounding soft tissue inflammation. However, the patient is asymptomatic, per clinical correlation. This may reflect the patient's baseline. 4. Trace bilateral pleural effusions, with mild left basilar atelectasis. 5. Scattered coronary artery calcification noted. 6. Trace free fluid in  the pelvis is nonspecific. 7. Diffuse calcification along the abdominal aorta and its branches, including the renal arteries and superior mesenteric artery. 8. Enlarged prostate noted. Electronically Signed   By: Garald Balding [name]   On: 01/09/2015 00:08   Dg Chest 2 View  01/07/2015  CLINICAL DATA:  81 year old male with shortness of breath on exertion and chest tightness for the past 1-1/2 months. EXAM: CHEST  2 VIEW COMPARISON:  No priors. FINDINGS: Lung volumes are normal. No consolidative airspace disease. No pleural effusions. Mild bibasilar scarring. No pneumothorax. Well demarcated 7 mm nodule projecting over the lower left hemithorax, favored to represent a nipple shadow (not visualized on the lateral projection). No other suspicious appearing pulmonary nodules or masses are otherwise noted. No evidence of pulmonary edema. Heart size is within normal limits, but there is prominence of the left ventricular contour, which could indicate an underlying left ventricular hypertrophy. Status post median sternotomy for CABG, including LIMA, in addition to aortic valve replacement (a stented bioprosthesis is noted). Atherosclerosis in the thoracic aorta. IMPRESSION: 1. No radiographic evidence of acute cardiopulmonary disease. 2. 7 mm well demarcated nodular density projecting over the lower left hemithorax, favored to represent a prominent nipple shadow. Repeat standing PA and lateral chest radiographs are recommended in 3-4 weeks with nipple markers in place to ensure the stability or resolution of this finding. 3. Atherosclerosis. Electronically Signed   By: Vinnie Langton [name]   On:  01/07/2015 19:14         Subjective: Patient denies fevers, chills, headache, chest pain, dyspnea, nausea, vomiting, diarrhea, abdominal pain, dysuria, hematuria   Objective: Filed Vitals:   01/10/15 1803 01/10/15 2052 01/11/15 0500 01/11/15 0900  BP: 172/53 157/50 145/68 123/93  Pulse: 52 53 60 71  Temp: 98.1 F (36.7 C) 97.7 F (36.5 C) 98.4 F (36.9 C) 97.6 F (36.4 C)  TempSrc: Oral Oral Oral Oral  Resp: 18 17 16 16   Height:      Weight:      SpO2: 98% 98% 99% 99%    Intake/Output Summary (Last 24 hours) at 01/11/15 1746 Last data filed at 01/11/15 1300  Gross per 24 hour  Intake 2891.33 ml  Output   2300 ml  Net 591.33 ml   Weight change:  Exam:   General:  Pt is alert, follows commands appropriately, not in acute distress  HEENT: No icterus, No thrush, No neck mass, [place]/AT  Cardiovascular: RRR, S1/S2, no rubs, no gallops  Respiratory: CTA bilaterally, no wheezing, no crackles, no rhonchi  Abdomen: Soft/+BS, non tender, non distended, no guarding  Extremities: No edema, No lymphangitis, No petechiae, No rashes, no synovitis  Data Reviewed: Basic Metabolic Panel:  Recent Labs Lab 01/07/15 1121 01/07/15 1833 01/10/15 0517  NA 140 139 140  K 4.0 4.1 3.8  CL 104 103 103  CO2 25 26 27   GLUCOSE 100* 141* 88  BUN 30* 26* 18  CREATININE 1.49* 1.49* 1.39*  CALCIUM 9.1 9.1 9.2   Liver Function Tests:  Recent Labs Lab 01/07/15 1833  AST 20  ALT 9*  ALKPHOS 87  BILITOT 0.3  PROT 6.3*  ALBUMIN 3.8   No results for input(s): LIPASE, AMYLASE in the last 168 hours. No results for input(s): AMMONIA in the last 168 hours. CBC:  Recent Labs Lab 01/07/15 1833 01/08/15 0440 01/09/15 0424 01/10/15 0517 01/11/15 0518  WBC 6.0 5.3 5.6 6.0 7.0  NEUTROABS 4.2 3.2  --   --   --  HGB 6.2* 7.2* 8.8* 9.7* 10.8*  HCT 20.7* 23.3* 27.3* 30.7* 34.7*  MCV 73.4* 75.2* 76.5* 76.0* 78.2  PLT 222 182 170 207 227   Cardiac Enzymes: No results for  input(s): CKTOTAL, CKMB, CKMBINDEX, TROPONINI in the last 168 hours. BNP: Invalid input(s): POCBNP CBG: No results for input(s): GLUCAP in the last 168 hours.  No results found for this or any previous visit (from the past 240 hour(s)).   Scheduled Meds: . amLODipine  5 mg Oral Daily  . donepezil  10 mg Oral QHS  . HYDROcodone-acetaminophen  1 tablet Oral BID  . metoprolol  50 mg Oral BID  . pantoprazole (PROTONIX) IV  40 mg Intravenous Q24H  . tamsulosin  0.4 mg Oral QPC supper   Continuous Infusions: . sodium chloride 20 mL/hr at 01/10/15 1426  . sodium chloride       Al Gagen, DO  [facility] Pager [phone]  If 7PM-7AM, please contact night-coverage [email or web address] Password [email or web address] 01/11/2015, 5:46 PM   LOS: 4 days

## 2015-01-11 NOTE — Progress Notes (Signed)
Drank 1/2 a gallon of golytely. Move bowels once. Encouraged to finish the remaining half of the prep. Patient made aware of  scheduled colonoscopy at 1000 am.

## 2015-01-11 NOTE — Progress Notes (Signed)
Patient ID: Michael Blanchard, male   DOB: December 05, 1925, 81 y.o.   MRN: [id number] [name] 81 y.o. 06/25/1925   Subjective: Not adequately prepped for colonoscopy which was subsequently cancelled this morning. He reports less brown appearance to his stool since this morning. Son reports that he has not had any prep since 9AM. Denies abdominal pain, nausea, or vomiting.  Objective: Vital signs in last 24 hours: Filed Vitals:   01/11/15 0900  BP: 123/93  Pulse: 71  Temp: 97.6 F (36.4 C)  Resp: 16    Physical Exam: Gen: alert, no acute distress, elderly, thin CV: RRR Chest: CTA B Abd: soft, nontender, nondistended, +BS Ext: no edema  Lab Results:  Recent Labs  01/10/15 0517  NA 140  K 3.8  CL 103  CO2 27  GLUCOSE 88  BUN 18  CREATININE 1.39*  CALCIUM 9.2   No results for input(s): AST, ALT, ALKPHOS, BILITOT, PROT, ALBUMIN in the last 72 hours.  Recent Labs  01/10/15 0517 01/11/15 0518  WBC 6.0 7.0  HGB 9.7* 10.8*  HCT 30.7* 34.7*  MCV 76.0* 78.2  PLT 207 227   No results for input(s): LABPROT, INR in the last 72 hours.    Assessment/Plan: Anemia with plans for a colonoscopy. Encouraged to complete his prep (nauseous yesterday while drinking it) and may need additional prep if stools are not clear. Will plan on colonoscopy tomorrow morning. Clear liquid diet. NPO p MN.   [place] C. 01/11/2015, 12:13 PM  Pager [phone]  If no answer or after 5 PM call [phone]

## 2015-01-12 ENCOUNTER — Encounter (HOSPITAL_COMMUNITY)
Admission: EM | Disposition: A | Discharge status: Home / Self Care | Payer: Medicare Other | Source: Home / Self Care | Attending: Internal Medicine

## 2015-01-12 ENCOUNTER — Inpatient Hospital Stay (HOSPITAL_COMMUNITY): Payer: Medicare Other

## 2015-01-12 ENCOUNTER — Encounter (HOSPITAL_COMMUNITY): Payer: Self-pay | Admitting: *Deleted

## 2015-01-12 DIAGNOSIS — C189 Malignant neoplasm of colon, unspecified: Secondary | ICD-10-CM

## 2015-01-12 DIAGNOSIS — C180 Malignant neoplasm of cecum: Secondary | ICD-10-CM | POA: Insufficient documentation

## 2015-01-12 HISTORY — PX: COLONOSCOPY: SHX5424

## 2015-01-12 LAB — CBC
HCT: 30.9 % — ABNORMAL LOW (ref 39.0–52.0)
Hemoglobin: 9.4 g/dL — ABNORMAL LOW (ref 13.0–17.0)
MCH: 23.7 pg — ABNORMAL LOW (ref 26.0–34.0)
MCHC: 30.4 g/dL (ref 30.0–36.0)
MCV: 77.8 fL — ABNORMAL LOW (ref 78.0–100.0)
PLATELETS: 191 10*3/uL (ref 150–400)
RBC: 3.97 MIL/uL — AB (ref 4.22–5.81)
RDW: 18.4 % — AB (ref 11.5–15.5)
WBC: 5.7 10*3/uL (ref 4.0–10.5)

## 2015-01-12 SURGERY — COLONOSCOPY
Anesthesia: Moderate Sedation

## 2015-01-12 MED ORDER — DIPHENHYDRAMINE HCL 50 MG/ML IJ SOLN
INTRAMUSCULAR | Status: AC
  Filled 2015-01-12: qty 1

## 2015-01-12 MED ORDER — FENTANYL CITRATE (PF) 100 MCG/2ML IJ SOLN
INTRAMUSCULAR | Status: DC | PRN
  Administered 2015-01-12: 25 ug via INTRAVENOUS
  Administered 2015-01-12: 12.5 ug via INTRAVENOUS

## 2015-01-12 MED ORDER — FENTANYL CITRATE (PF) 100 MCG/2ML IJ SOLN
INTRAMUSCULAR | Status: AC
  Filled 2015-01-12: qty 4

## 2015-01-12 MED ORDER — MIDAZOLAM HCL 5 MG/5ML IJ SOLN
INTRAMUSCULAR | Status: DC | PRN
Start: 2015-01-12 — End: 2015-01-12
  Administered 2015-01-12: 1 mg via INTRAVENOUS
  Administered 2015-01-12 (×2): 2 mg via INTRAVENOUS

## 2015-01-12 MED ORDER — MIDAZOLAM HCL 5 MG/ML IJ SOLN
INTRAMUSCULAR | Status: AC
  Filled 2015-01-12: qty 3

## 2015-01-12 NOTE — Progress Notes (Signed)
PROGRESS NOTE  Keyon Liller [facility] NLZ:[id number] DOB: 1925-12-13 DOA: 01/07/2015 PCP: [name], MD  Brief history 81 year old male with a history of CAD status post CABG, bioprosthetic aortic valve replacement, hypertension presented with symptomatic anemia. For the past month, the patient has been complaining of exertional chest discomfort and shortness of breath. He denies any syncope, nausea, vomiting, diarrhea. There is another medication or melena. He went to see his cardiologist on 01/07/2015. Blood work was obtained and revealed a hemoglobin of 6.4. As result, the patient was told to come to the emergency department for further evaluation. the patient denies any NSAID use but does use a Flector patch. Assessment/Plan: Cecal mass -01/12/15--colonoscopy--large cecal mass, internal hemorrhoids -Screening CT chest without metastasis -Screening MRI brain metastasis -Consult general surgery in am  -long discussion with the patient and family -they agree with surgical opinion and possible surgery Symptomatically anemia/chronic blood loss anemia  -09/18/2012 hemoglobin 13.1  -01/07/2015 hemoglobin 6.4  -10/25-transfused 2 units PRBC -01/08/15--transfuse another 2 units (4 total) -iron studies--ferritin 10, iron sat 21% -FOBT positive -pt has never had EGD or colonoscopy -[facility]-appreciate consult -01/08/2015 CT abdomen and pelvis diffuse diverticulosis sigmoid colon, enlarged prostate, ASVD -01/10/2015 EGD--negative except small hiatus hernia -Hgb stable since transfusion Dilated appendix on CT -Absolutely no abdominal pain -likely due to cecal mass -Afebrile hemodynamically stable -Doubt acute appendicitis CKD stage III  -Baseline creatinine 1.2-1.4  CAD with history of CABG -Exertional chest discomfort likely due to the patient's anemia--improved after RBC transfusion -01/07/2015 echo shows EF 55-60%, grade 2 DD, no WMA -Continue metoprolol -Hold Plavix  pending surgical evaluation S/P bioprosthetic AVR -Echo--no obstruction Hypertension -Continue metoprolol and amlodipine -increase amlodipine to 5 mg daily -hold Hyzaar and monitor Hyperlipidemia  -Continue statin  Mild Cognitive Impairment -continue Aricept   Family Communication: Son and daughter udpdated at beside 10/30 Disposition Plan: Home when medically stable -total time 45 min     Procedures/Studies: Ct Abdomen Pelvis Wo Contrast  01/09/2015  CLINICAL DATA:  Hypochromic microcytic anemia, acute onset. Initial encounter. EXAM: CT ABDOMEN AND PELVIS WITHOUT CONTRAST TECHNIQUE: Multidetector CT imaging of the abdomen and pelvis was performed following the standard protocol without IV contrast. COMPARISON:  None. FINDINGS: Trace bilateral pleural effusions are noted, with mild left basilar atelectasis. Scattered coronary artery calcifications are seen. The liver and spleen are unremarkable in appearance. The gallbladder is within normal limits. The pancreas and adrenal glands are unremarkable. The kidneys are unremarkable in appearance. There is no evidence of hydronephrosis. No renal or ureteral stones are seen. Mild nonspecific perinephric stranding is noted bilaterally. Trace free fluid within the pelvis is nonspecific. The small bowel is unremarkable in appearance. The stomach is within normal limits. No acute vascular abnormalities are seen. Diffuse calcification is noted along the abdominal aorta and its branches, including the renal arteries and superior mesenteric artery. The appendix is dilated, measuring up to 1.0 cm, with mild surrounding soft tissue inflammation. However, the patient is asymptomatic, per clinical correlation. This may reflect the patient's baseline. Diffuse diverticulosis is noted along the sigmoid colon, without evidence of diverticulitis. The colon is otherwise unremarkable. The bladder is mildly distended and grossly unremarkable in appearance. The  prostate is enlarged, measuring 5.2 cm in transverse dimension. No inguinal lymphadenopathy is seen. No acute osseous abnormalities are identified. IMPRESSION: 1. No evidence of hematoma within the abdomen or pelvis. 2. Diffuse diverticulosis along the sigmoid colon, without evidence of diverticulitis. 3. Dilated  appendix, measuring 1.0 cm, with mild surrounding soft tissue inflammation. However, the patient is asymptomatic, per clinical correlation. This may reflect the patient's baseline. 4. Trace bilateral pleural effusions, with mild left basilar atelectasis. 5. Scattered coronary artery calcification noted. 6. Trace free fluid in the pelvis is nonspecific. 7. Diffuse calcification along the abdominal aorta and its branches, including the renal arteries and superior mesenteric artery. 8. Enlarged prostate noted. Electronically Signed   By: Garald Balding [name]   On: 01/09/2015 00:08   Dg Chest 2 View  01/07/2015  CLINICAL DATA:  81 year old male with shortness of breath on exertion and chest tightness for the past 1-1/2 months. EXAM: CHEST  2 VIEW COMPARISON:  No priors. FINDINGS: Lung volumes are normal. No consolidative airspace disease. No pleural effusions. Mild bibasilar scarring. No pneumothorax. Well demarcated 7 mm nodule projecting over the lower left hemithorax, favored to represent a nipple shadow (not visualized on the lateral projection). No other suspicious appearing pulmonary nodules or masses are otherwise noted. No evidence of pulmonary edema. Heart size is within normal limits, but there is prominence of the left ventricular contour, which could indicate an underlying left ventricular hypertrophy. Status post median sternotomy for CABG, including LIMA, in addition to aortic valve replacement (a stented bioprosthesis is noted). Atherosclerosis in the thoracic aorta. IMPRESSION: 1. No radiographic evidence of acute cardiopulmonary disease. 2. 7 mm well demarcated nodular density projecting  over the lower left hemithorax, favored to represent a prominent nipple shadow. Repeat standing PA and lateral chest radiographs are recommended in 3-4 weeks with nipple markers in place to ensure the stability or resolution of this finding. 3. Atherosclerosis. Electronically Signed   By: Vinnie Langton [name]   On: 01/07/2015 19:14         Subjective: Patient denies fevers, chills, headache, chest pain, dyspnea, nausea, vomiting, diarrhea, abdominal pain, dysuria, hematuria   Objective: Filed Vitals:   01/12/15 1010 01/12/15 1015 01/12/15 1020 01/12/15 1025  BP: 161/32 142/29 150/18 149/27  Pulse: 48 56 51 54  Temp:      TempSrc:      Resp: 18 13 16 19   Height:      Weight:      SpO2: 100% 100% 100% 99%    Intake/Output Summary (Last 24 hours) at 01/12/15 1806 Last data filed at 01/12/15 1000  Gross per 24 hour  Intake    610 ml  Output    151 ml  Net    459 ml   Weight change: -0.122 kg (-4.3 oz) Exam:   General:  Pt is alert, follows commands appropriately, not in acute distress  HEENT: No icterus, No thrush, No neck mass, [place]/AT  Cardiovascular: RRR, S1/S2, no rubs, no gallops  Respiratory: CTA bilaterally, no wheezing, no crackles, no rhonchi  Abdomen: Soft/+BS, non tender, non distended, no guarding; no hepatosplenomegaly   Extremities: No edema, No lymphangitis, No petechiae, No rashes, no synovitis; no cyanosis or clubbing   Data Reviewed: Basic Metabolic Panel:  Recent Labs Lab 01/07/15 1121 01/07/15 1833 01/10/15 0517  NA 140 139 140  K 4.0 4.1 3.8  CL 104 103 103  CO2 25 26 27   GLUCOSE 100* 141* 88  BUN 30* 26* 18  CREATININE 1.49* 1.49* 1.39*  CALCIUM 9.1 9.1 9.2   Liver Function Tests:  Recent Labs Lab 01/07/15 1833  AST 20  ALT 9*  ALKPHOS 87  BILITOT 0.3  PROT 6.3*  ALBUMIN 3.8   No results for input(s): LIPASE, AMYLASE  in the last 168 hours. No results for input(s): AMMONIA in the last 168 hours. CBC:  Recent Labs Lab  01/07/15 1833 01/08/15 0440 01/09/15 0424 01/10/15 0517 01/11/15 0518 01/12/15 0550  WBC 6.0 5.3 5.6 6.0 7.0 5.7  NEUTROABS 4.2 3.2  --   --   --   --   HGB 6.2* 7.2* 8.8* 9.7* 10.8* 9.4*  HCT 20.7* 23.3* 27.3* 30.7* 34.7* 30.9*  MCV 73.4* 75.2* 76.5* 76.0* 78.2 77.8*  PLT 222 182 170 207 227 191   Cardiac Enzymes: No results for input(s): CKTOTAL, CKMB, CKMBINDEX, TROPONINI in the last 168 hours. BNP: Invalid input(s): POCBNP CBG: No results for input(s): GLUCAP in the last 168 hours.  No results found for this or any previous visit (from the past 240 hour(s)).   Scheduled Meds: . amLODipine  5 mg Oral Daily  . donepezil  10 mg Oral QHS  . HYDROcodone-acetaminophen  1 tablet Oral BID  . metoprolol  50 mg Oral BID  . pantoprazole (PROTONIX) IV  40 mg Intravenous Q24H  . tamsulosin  0.4 mg Oral QPC supper   Continuous Infusions: . sodium chloride 1 mL (01/11/15 1005)  . sodium chloride       Lucion Dilger, DO  [facility] Pager [phone]  If 7PM-7AM, please contact night-coverage [email or web address] Password [email or web address] 01/12/2015, 6:06 PM   LOS: 5 days

## 2015-01-12 NOTE — Brief Op Note (Signed)
Large nonobstructing cecal mass (malignant-appearing) - s/p biopsies. Surgery consult tomorrow. Clear liquid diet. D/W patient and son. He and his family need to discuss surgical risks/benefits with surgeon tomorrow and decide whether he wants to have it done. My recommendation is surgery. Dr. Courtney Paris to see in f/u tomorrow.

## 2015-01-12 NOTE — Op Note (Signed)
[facility] [place], [id number]   COLONOSCOPY PROCEDURE REPORT     EXAM DATE: 01/18/2015  PATIENT NAME:      Michael Blanchard, Michael Blanchard           MR #:      [id number]  BIRTHDATE:       05/09/1925      VISIT #:     [id number]  ATTENDING:     Wilford Corner, MD     STATUS:     outpatient REFERRING MD: ASA CLASS:        Class III  INDICATIONS:  The patient is a 81 yr old male here for a colonoscopy due to iron deficiency anemia and heme-positive stool. PROCEDURE PERFORMED:     Colonoscopy with biopsy MEDICATIONS:     Fentanyl 37.5 mg IV and Versed 5 mg IV ESTIMATED BLOOD LOSS:     None  CONSENT: The patient understands the risks and benefits of the procedure and understands that these risks include, but are not limited to: sedation, allergic reaction, infection, perforation and/or bleeding. Alternative means of evaluation and treatment include, among others: physical exam, x-rays, and/or surgical intervention. The patient elects to proceed with this endoscopic procedure.  DESCRIPTION OF PROCEDURE: During intra-op preparation period all mechanical & medical equipment was checked for proper function. Hand hygiene and appropriate measures for infection prevention was taken. After the risks, benefits and alternatives of the procedure were thoroughly explained, Informed consent was verified, confirmed and timeout was successfully executed by the treatment team. A digital exam revealed no abnormalities of the rectum.      The Pentax Ped Colon [id number] endoscope was introduced through the anus and advanced to the cecum, which was identified by both the appendix and ileocecal valve. The prep was fair.. The instrument was then slowly withdrawn as the colon was fully examined. Estimated blood loss is zero unless otherwise noted in this procedure report. A large nonobstructing firm, friable, malignant appearing mass noted in the cecal base that  started bleeding during intubation of the cecum. Biopsies were taken for histology. ICV was identified and was intubated showing a normal-appearing terminal ileum. Mild melanosis seen throughout the colon. Scattered sigmoid diverticulosis noted.     Retroflexed views revealed medium-sized internal hemorrhoids.  The scope was then completely withdrawn from the patient and the procedure terminated.     ADVERSE EVENTS:      There were no immediate complications.   IMPRESSIONS:     Large malignant-appearing nonobstructing cecal mass - s/p biopsies Sigmoid diverticulosis Melanosis coli Internal hemorrhoids  RECOMMENDATIONS:     F/U on path; Surgical consult; Clear liquid diet; Supportive care   Wilford Corner, MD eSigned:  Wilford Corner, MD 01/18/2015 10:46 AM   cc:  CPT CODES: ICD CODES:  The ICD and CPT codes recommended by this software are interpretations from the data that the clinical staff has captured with the software.  The verification of the translation of this report to the ICD and CPT codes and modifiers is the sole responsibility of the health care institution and practicing physician where this report was generated.  [employer]. will not be held responsible for the validity of the ICD and CPT codes included on this report.  AMA assumes no liability for data contained or not contained herein. CPT is a Designer, television/film set of the [employer].  PATIENT NAME:  Michael Blanchard, Michael Blanchard MR#: [id number]

## 2015-01-12 NOTE — H&P (View-Only) (Signed)
Patient ID: Michael Blanchard, male   DOB: [birth year], 81 y.o.   MRN: [id number] [facility] Gastroenterology Progress Note  Michael Blanchard 81 y.o. January 01, 1926   Subjective: Not adequately prepped for colonoscopy which was subsequently cancelled this morning. He reports less brown appearance to his stool since this morning. Son reports that he has not had any prep since 9AM. Denies abdominal pain, nausea, or vomiting.  Objective: Vital signs in last 24 hours: Filed Vitals:   01/11/15 0900  BP: 123/93  Pulse: 71  Temp: 97.6 F (36.4 C)  Resp: 16    Physical Exam: Gen: alert, no acute distress, elderly, thin CV: RRR Chest: CTA B Abd: soft, nontender, nondistended, +BS Ext: no edema  Lab Results:  Recent Labs  01/10/15 0517  NA 140  K 3.8  CL 103  CO2 27  GLUCOSE 88  BUN 18  CREATININE 1.39*  CALCIUM 9.2   No results for input(s): AST, ALT, ALKPHOS, BILITOT, PROT, ALBUMIN in the last 72 hours.  Recent Labs  01/10/15 0517 01/11/15 0518  WBC 6.0 7.0  HGB 9.7* 10.8*  HCT 30.7* 34.7*  MCV 76.0* 78.2  PLT 207 227   No results for input(s): LABPROT, INR in the last 72 hours.    Assessment/Plan: Anemia with plans for a colonoscopy. Encouraged to complete his prep (nauseous yesterday while drinking it) and may need additional prep if stools are not clear. Will plan on colonoscopy tomorrow morning. Clear liquid diet. NPO p MN.   [place] C. 01/11/2015, 12:13 PM  Pager [phone]  If no answer or after 5 PM call [phone]

## 2015-01-12 NOTE — Interval H&P Note (Signed)
History and Physical Interval Note:  01/12/2015 9:55 AM  Michael Blanchard  has presented today for surgery, with the diagnosis of GI Bleed  The various methods of treatment have been discussed with the patient and family. After consideration of risks, benefits and other options for treatment, the patient has consented to  Procedure(s): COLONOSCOPY (N/A) as a surgical intervention .  The patient's history has been reviewed, patient examined, no change in status, stable for surgery.  I have reviewed the patient's chart and labs.  Questions were answered to the patient's satisfaction.     [place] C.

## 2015-01-13 ENCOUNTER — Other Ambulatory Visit: Payer: Self-pay | Admitting: Cardiology

## 2015-01-13 ENCOUNTER — Encounter (HOSPITAL_COMMUNITY): Payer: Self-pay | Admitting: Gastroenterology

## 2015-01-13 DIAGNOSIS — G500 Trigeminal neuralgia: Secondary | ICD-10-CM

## 2015-01-13 DIAGNOSIS — C182 Malignant neoplasm of ascending colon: Secondary | ICD-10-CM

## 2015-01-13 LAB — CBC
HCT: 31.8 % — ABNORMAL LOW (ref 39.0–52.0)
Hemoglobin: 9.9 g/dL — ABNORMAL LOW (ref 13.0–17.0)
MCH: 24.4 pg — AB (ref 26.0–34.0)
MCHC: 31.1 g/dL (ref 30.0–36.0)
MCV: 78.3 fL (ref 78.0–100.0)
PLATELETS: 175 10*3/uL (ref 150–400)
RBC: 4.06 MIL/uL — AB (ref 4.22–5.81)
RDW: 18.8 % — AB (ref 11.5–15.5)
WBC: 6 10*3/uL (ref 4.0–10.5)

## 2015-01-13 MED ORDER — CARBAMAZEPINE 200 MG PO TABS
200.0000 mg | ORAL_TABLET | Freq: Two times a day (BID) | ORAL | Status: DC
  Administered 2015-01-13: 200 mg via ORAL
  Filled 2015-01-13: qty 1

## 2015-01-13 NOTE — Discharge Instructions (Signed)
Please see Dr. Johney Maine in the office regarding your cecal (intestine) mass 01/14/15 at 11am, please arrive by 10:20am.  Bring a list of your medications.  Be prepared to fill out a medical history intake form which can take up to 30 minutes.

## 2015-01-13 NOTE — Progress Notes (Signed)
No complaints after colonoscopy. Discussed with patient. Will call in surgical consult, for cecal mass.

## 2015-01-13 NOTE — Discharge Summary (Signed)
Physician Discharge Summary  Michael Blanchard [facility] LHT:[id number] DOB: [birth year] DOA: 01/07/2015  PCP: [name], MD  Admit date: 01/07/2015 Discharge date: 01/13/2015  Recommendations for Outpatient Follow-up:  1. Pt will need to follow up with PCP in 2 weeks post discharge 2. Please obtain BMP and CBC in 1-2 weeks 3. Follow up with Dr. Clyda Greener, general surgery, on 01/14/15 at 1020AM 4. [facility] will contact pt with medical oncology appt 5. Follow up on CEA results    Discharge Diagnoses:  Cecal mass -01/12/15--colonoscopy--large cecal mass, internal hemorrhoids -Screening CT chest without metastasis -Screening MRI brain without metastasis -Consulted general surgery--they scheduled for pt to be seen in out pt clinic on 01/14/15 at 1020AM with Dr. Johney Maine -long discussion with the patient and family -they agree with surgical opinion and possible surgery -CEA done prior to d/c-please follow up on results Symptomatically anemia/chronic blood loss anemia  -09/18/2012 hemoglobin 13.1  -01/07/2015 hemoglobin 6.4  -10/25-transfused 2 units PRBC -01/08/15--transfuse another 2 units (4 total) -iron studies--ferritin 10, iron sat 21% -FOBT positive -pt has never had EGD or colonoscopy -[facility]-appreciate consult -01/08/2015 CT abdomen and pelvis diffuse diverticulosis sigmoid colon, enlarged prostate, ASVD -01/10/2015 EGD--negative except small hiatus hernia -Hgb stable since transfusion Dilated appendix on CT -Absolutely no abdominal pain -likely due to cecal mass -Afebrile hemodynamically stable -Doubt acute appendicitis CKD stage III  -Baseline creatinine 1.2-1.4  CAD with history of CABG -Exertional chest discomfort likely due to the patient's anemia--improved after RBC transfusion -01/07/2015 echo shows EF 55-60%, grade 2 DD, no WMA -Continue metoprolol -Hold Plavix pending surgical evaluation S/P bioprosthetic AVR -Echo--no  obstruction Hypertension -Continue metoprolol and amlodipine -increase amlodipine to 5 mg daily -hold Hyzaar and monitor Hyperlipidemia  -Continue statin  Mild Cognitive Impairment -continue Aricept  Discharge Condition: stable  Disposition: home Follow-up Information    Follow up with [name]., MD. Daphane Shepherd on 01/14/2015.   Specialty:  General Surgery   Why:  Appointment with Dr. Johney Maine (general surgeon) at 11am, Please arrive no later than 10:20am to check in and fill out paperwork.   Contact information:   [address] [place] [id number] [phone]       Diet:regular Wt Readings from Last 3 Encounters:  01/11/15 72 kg (158 lb 11.7 oz)  01/07/15 75.206 kg (165 lb 12.8 oz)  07/30/14 78.835 kg (173 lb 12.8 oz)    History of present illness:  81 year old male with a history of CAD status post CABG, bioprosthetic aortic valve replacement, hypertension presented with symptomatic anemia. For the past month, the patient has been complaining of exertional chest discomfort and shortness of breath. He denies any syncope, nausea, vomiting, diarrhea. There is another medication or melena. He went to see his cardiologist on 01/07/2015. Blood work was obtained and revealed a hemoglobin of 6.4. As result, the patient was told to come to the emergency department for further evaluation.The patient denies any NSAID use but does use a Flector patch. GI was consulted. The patient underwent EGD which was unremarkable. Colonoscopy was subsequently performed and showed a cecal mass. General surgery was consulted. They recommended outpatient follow-up. Outpatient follow-up was set up for 01/14/2015 with Dr. Michael Boston at 1020AM  Consultants: GI--[facility]  Discharge Exam: Filed Vitals:   01/13/15 1146  BP: 131/50  Pulse: 54  Temp: 97.7 F (36.5 C)  Resp: 18   Filed Vitals:   01/13/15 0506 01/13/15 0824 01/13/15 1117 01/13/15 1146  BP: 143/51 174/56  152/62 131/50  Pulse: 60 53 70 54  Temp: 98.7 F (37.1 C) 97.9 F (36.6 C)  97.7 F (36.5 C)  TempSrc: Oral Oral  Oral  Resp: 18 18  18   Height:      Weight:      SpO2: 98% 99%  98%   General: A&O x 3, NAD, pleasant, cooperative Cardiovascular: RRR, no rub, no gallop, no S3 Respiratory: Bibasilar rales without wheezing. Good air movement Abdomen:soft, nontender, nondistended, positive bowel sounds Extremities: No edema, No lymphangitis, no petechiae  Discharge Instructions      Discharge Instructions    Diet - low sodium heart healthy    Complete by:  As directed      Increase activity slowly    Complete by:  As directed             Medication List    TAKE these medications        amLODipine 2.5 MG tablet  Commonly known as:  NORVASC  TAKE 1 BY MOUTH DAILY     CALCIUM 600 + D PO  Take 1 tablet by mouth daily.     carbamazepine 200 MG tablet  Commonly known as:  TEGRETOL  Take 1 tablet (200 mg total) by mouth 2 (two) times daily.     clopidogrel 75 MG tablet  Commonly known as:  PLAVIX  Take 1 tablet (75 mg total) by mouth daily.     CRANBERRY PO  Take 1 tablet by mouth daily. Taking  2000 daily     diclofenac 1.3 % Ptch  Commonly known as:  FLECTOR  Place 1 patch onto the skin 2 (two) times daily.     donepezil 10 MG tablet  Commonly known as:  ARICEPT  TAKE 1 BY MOUTH AT BEDTIME     HYDROcodone-acetaminophen 7.5-325 MG tablet  Commonly known as:  NORCO  Take 1 tablet by mouth 2 (two) times daily.     losartan-hydrochlorothiazide 100-25 MG tablet  Commonly known as:  HYZAAR  TAKE 1 BY MOUTH DAILY     metoprolol 50 MG tablet  Commonly known as:  LOPRESSOR  TAKE 1 BY MOUTH TWICE DAILY     multivitamin tablet  Take 1 tablet by mouth daily.     nitroGLYCERIN 0.4 MG SL tablet  Commonly known as:  NITROSTAT  Place 1 tablet (0.4 mg total) under the tongue every 5 (five) minutes as needed for chest pain.     OSTEO BI-FLEX JOINT SHIELD PO  Take  by mouth 2 (two) times daily.     oxyCODONE-acetaminophen 5-325 MG tablet  Commonly known as:  PERCOCET/ROXICET  Take 325 tablets by mouth every 6 (six) hours as needed for moderate pain.     pregabalin 50 MG capsule  Commonly known as:  LYRICA  Take 50 mg by mouth daily as needed. For pain     rosuvastatin 10 MG tablet  Commonly known as:  CRESTOR  Take 5 mg by mouth See admin instructions. Take 3 times a week no specific days per patient     STOOL SOFTENER PO  Take 1 tablet by mouth daily as needed. For soft stools     tamsulosin 0.4 MG Caps capsule  Commonly known as:  FLOMAX         The results of significant diagnostics from this hospitalization (including imaging, microbiology, ancillary and laboratory) are listed below for reference.    Significant Diagnostic Studies: Ct Abdomen Pelvis Wo Contrast  01/09/2015  CLINICAL  DATA:  Hypochromic microcytic anemia, acute onset. Initial encounter. EXAM: CT ABDOMEN AND PELVIS WITHOUT CONTRAST TECHNIQUE: Multidetector CT imaging of the abdomen and pelvis was performed following the standard protocol without IV contrast. COMPARISON:  None. FINDINGS: Trace bilateral pleural effusions are noted, with mild left basilar atelectasis. Scattered coronary artery calcifications are seen. The liver and spleen are unremarkable in appearance. The gallbladder is within normal limits. The pancreas and adrenal glands are unremarkable. The kidneys are unremarkable in appearance. There is no evidence of hydronephrosis. No renal or ureteral stones are seen. Mild nonspecific perinephric stranding is noted bilaterally. Trace free fluid within the pelvis is nonspecific. The small bowel is unremarkable in appearance. The stomach is within normal limits. No acute vascular abnormalities are seen. Diffuse calcification is noted along the abdominal aorta and its branches, including the renal arteries and superior mesenteric artery. The appendix is dilated, measuring up  to 1.0 cm, with mild surrounding soft tissue inflammation. However, the patient is asymptomatic, per clinical correlation. This may reflect the patient's baseline. Diffuse diverticulosis is noted along the sigmoid colon, without evidence of diverticulitis. The colon is otherwise unremarkable. The bladder is mildly distended and grossly unremarkable in appearance. The prostate is enlarged, measuring 5.2 cm in transverse dimension. No inguinal lymphadenopathy is seen. No acute osseous abnormalities are identified. IMPRESSION: 1. No evidence of hematoma within the abdomen or pelvis. 2. Diffuse diverticulosis along the sigmoid colon, without evidence of diverticulitis. 3. Dilated appendix, measuring 1.0 cm, with mild surrounding soft tissue inflammation. However, the patient is asymptomatic, per clinical correlation. This may reflect the patient's baseline. 4. Trace bilateral pleural effusions, with mild left basilar atelectasis. 5. Scattered coronary artery calcification noted. 6. Trace free fluid in the pelvis is nonspecific. 7. Diffuse calcification along the abdominal aorta and its branches, including the renal arteries and superior mesenteric artery. 8. Enlarged prostate noted. Electronically Signed   By: Garald Balding [name]   On: 01/09/2015 00:08   Dg Chest 2 View  01/07/2015  CLINICAL DATA:  81 year old male with shortness of breath on exertion and chest tightness for the past 1-1/2 months. EXAM: CHEST  2 VIEW COMPARISON:  No priors. FINDINGS: Lung volumes are normal. No consolidative airspace disease. No pleural effusions. Mild bibasilar scarring. No pneumothorax. Well demarcated 7 mm nodule projecting over the lower left hemithorax, favored to represent a nipple shadow (not visualized on the lateral projection). No other suspicious appearing pulmonary nodules or masses are otherwise noted. No evidence of pulmonary edema. Heart size is within normal limits, but there is prominence of the left ventricular  contour, which could indicate an underlying left ventricular hypertrophy. Status post median sternotomy for CABG, including LIMA, in addition to aortic valve replacement (a stented bioprosthesis is noted). Atherosclerosis in the thoracic aorta. IMPRESSION: 1. No radiographic evidence of acute cardiopulmonary disease. 2. 7 mm well demarcated nodular density projecting over the lower left hemithorax, favored to represent a prominent nipple shadow. Repeat standing PA and lateral chest radiographs are recommended in 3-4 weeks with nipple markers in place to ensure the stability or resolution of this finding. 3. Atherosclerosis. Electronically Signed   By: Vinnie Langton [name]   On: 01/07/2015 19:14   Ct Chest Wo Contrast  01/13/2015  CLINICAL DATA:  Colon cancer, evaluate for metastatic disease. EXAM: CT CHEST WITHOUT CONTRAST TECHNIQUE: Multidetector CT imaging of the chest was performed following the standard protocol without IV contrast. COMPARISON:  CT abdomen pelvis 01/08/2015. FINDINGS: Mediastinum/Nodes: Mediastinal lymph nodes are not enlarged  by CT size criteria. Hilar regions are difficult to definitively evaluate without IV contrast. No axillary adenopathy. Atherosclerotic calcification of the arterial vasculature. Heart size normal. No pericardial effusion. Lungs/Pleura: Minimal emphysematous change in the apices. Scattered 4 mm subpleural nodules in the lower lobes are seen with at least 1 calcified nodule. Minimal dependent atelectasis bilaterally. Trace left pleural effusion with mild volume loss in the adjacent left lower lobe. Airway is unremarkable. Upper abdomen: Visualized portions of the liver are grossly unremarkable. There may be tiny stones in the fundus of the gallbladder. Adrenal glands are unremarkable. There is slight fullness in the interpolar region of the right kidney, incompletely imaged. Punctate right renal stone. Visualized portions of the left kidney, spleen, pancreas, stomach  and bowel are grossly unremarkable. No upper abdominal adenopathy. Musculoskeletal: No worrisome lytic or sclerotic lesions. Degenerative changes are seen in the spine and right shoulder. IMPRESSION: 1. No evidence metastatic disease in the chest. 2. Tiny subpleural nodules in the lower lobes are likely subpleural lymph nodes. Continued attention on followup exams is warranted. 3. Trace left pleural effusion. 4. Slight fullness in the interpolar right kidney, incompletely imaged, possibly a column of Bertin. Difficult to definitively exclude a true mass. Further evaluation with pre and post contrast MRI or CT should be considered. MRI is preferred in younger patients (due to lack of ionizing radiation) and for evaluating calcified lesion(s). 5. Possible cholelithiasis. 6. Punctate right renal stone. Electronically Signed   By: Lorin Picket [name]   On: 01/13/2015 07:44   Mr Brain Wo Contrast  01/12/2015  CLINICAL DATA:  Colon cancer. Aortic valve replacement. Hypertension. EXAM: MRI HEAD WITHOUT CONTRAST TECHNIQUE: Multiplanar, multiecho pulse sequences of the brain and surrounding structures were obtained without intravenous contrast. COMPARISON:  MRI 08/02/2011 FINDINGS: Mild atrophy.  Ventricle size is mildly enlarged due to atrophy. Patchy hyperintensities throughout the cerebral white matter bilaterally similar to the prior study and consistent with chronic microvascular ischemia. Small areas of chronic infarction in the cerebellum bilaterally. Brainstem intact. Chronic micro hemorrhage right frontal white matter not seen previously. No other areas of hemorrhage. Negative for mass or edema. No evidence of metastatic disease on unenhanced images. Small metastatic deposits could be missed without gadolinium administration. Paranasal sinuses clear. Pituitary not enlarged. No bony lesion identified. IMPRESSION: Atrophy and chronic microvascular ischemia.  No acute abnormality. Electronically Signed   By:  Franchot Gallo [name]   On: 01/12/2015 21:30     Microbiology: No results found for this or any previous visit (from the past 240 hour(s)).   Labs: Basic Metabolic Panel:  Recent Labs Lab 01/07/15 1121 01/07/15 1833 01/10/15 0517  NA 140 139 140  K 4.0 4.1 3.8  CL 104 103 103  CO2 25 26 27   GLUCOSE 100* 141* 88  BUN 30* 26* 18  CREATININE 1.49* 1.49* 1.39*  CALCIUM 9.1 9.1 9.2   Liver Function Tests:  Recent Labs Lab 01/07/15 1833  AST 20  ALT 9*  ALKPHOS 87  BILITOT 0.3  PROT 6.3*  ALBUMIN 3.8   No results for input(s): LIPASE, AMYLASE in the last 168 hours. No results for input(s): AMMONIA in the last 168 hours. CBC:  Recent Labs Lab 01/07/15 1833 01/08/15 0440 01/09/15 0424 01/10/15 0517 01/11/15 0518 01/12/15 0550 01/13/15 0617  WBC 6.0 5.3 5.6 6.0 7.0 5.7 6.0  NEUTROABS 4.2 3.2  --   --   --   --   --   HGB 6.2* 7.2* 8.8* 9.7* 10.8* 9.4*  9.9*  HCT 20.7* 23.3* 27.3* 30.7* 34.7* 30.9* 31.8*  MCV 73.4* 75.2* 76.5* 76.0* 78.2 77.8* 78.3  PLT 222 182 170 207 227 191 175   Cardiac Enzymes: No results for input(s): CKTOTAL, CKMB, CKMBINDEX, TROPONINI in the last 168 hours. BNP: Invalid input(s): POCBNP CBG: No results for input(s): GLUCAP in the last 168 hours.  Time coordinating discharge:  Greater than 30 minutes  Signed:  Rivan Siordia, DO [facility] Pager: [phone] 01/13/2015, 12:25 PM

## 2015-01-13 NOTE — Consult Note (Signed)
The patient was found to have a malignant appearing cecal mass by coloscopy.  Biopsies pending.  He is now stable without continued bleeding.  He is not obstructed.  I have him scheduled for an office appointment tomorrow 01/14/15 at 11:00am, he needs to arrive by 10:20 to fill out paperwork and check in.  Ordered CEA level.  Jomarie Longs, PA-C General Surgery [facility] Surgery [phone]

## 2015-01-13 NOTE — Progress Notes (Signed)
Patient Discharge:  Disposition: Pt discharged home with family  Education: Pt educated on medications, follow up appointments, and all discharge instructions. Pt and family verbalized understanding.   IV: Removed  Telemetry: Removed. CCMD notified  Follow-up appointments:Reviewed with pt. Pt verbalized understanding of appointment 01/14/15.  Prescriptions: N/A  Transportation: Transported home by family  Belongings: All belongings taken with pt

## 2015-01-14 ENCOUNTER — Other Ambulatory Visit: Payer: Self-pay | Admitting: Surgery

## 2015-01-14 DIAGNOSIS — C180 Malignant neoplasm of cecum: Secondary | ICD-10-CM | POA: Diagnosis not present

## 2015-01-14 DIAGNOSIS — D500 Iron deficiency anemia secondary to blood loss (chronic): Secondary | ICD-10-CM | POA: Diagnosis not present

## 2015-01-14 LAB — CEA: CEA: 1.7 ng/mL (ref 0.0–4.7)

## 2015-01-14 NOTE — H&P (Signed)
[facility] 01/14/2015 11:30 AM Location: [facility] Patient #: [id number] DOB: [birth year] Married / Language: English / Race: White Male  Patient Care Team: Michael Carol, MD as PCP - General (Internal Medicine) Michael Coco, MD as Consulting Physician (Cardiology) Michael Essex, MD as Consulting Physician (Gastroenterology) Michael Boston, MD as Consulting Physician (General Surgery)   History of Present Illness Michael Hector MD; 01/14/2015 12:43 PM) The patient is a 81 year old male who presents with colorectal cancer. Patient sent by Michael Blanchard with [facility] hospitalist for diagnosis of cecal cancer causing bleeding.  Pleasant elderly gentleman. Has significant osteoarthritis and chronic pain on chronic narcotics. Trigeminal neuralgia as well. Takes care of his wife who is blind and wheelchair ridden. As one daughter that helps almost every day. Other family members traveling from other parts of the country. Patient history coronary disease status bypass graft as well as valve replacement on chronic Plavix anticoagulation. That was in 2001. Noted worsening dyspnea over the past few months. Found to be severely anemic in the cardiology office. Admitted. Transfuse. CT scan showing some thickening in the ileocecal region. Endoscopy by Michael Blanchard confirmed a mass. Biopsy consistent with adenocarcinoma. Surgical consultation requested. Transfuse. Stabilized. Patient comes today with family to discuss if surgery is needed.  Patient walks with a cane. He has knee braces. He claims he somewhat independent but family has concerns. Normally has a bowel movement about every day with the help of a lot of stool softeners since he is on chronic narcotics. He's never had abdominal surgery. A heart bypass surgery no other operations. No difficulty with swallowing or dysphagia. I don't believe he was sent home on iron. He is back to taking his Plavix for now.   Other  Problems Michael Blanchard, CMA; 01/14/2015 11:30 AM) Arthritis Cerebrovascular Accident Colon Cancer High blood pressure Hypercholesterolemia  Past Surgical History Michael Blanchard, CMA; 01/14/2015 11:30 AM) Coronary Artery Bypass Graft Tonsillectomy Valve Replacement  Diagnostic Studies History Michael Blanchard, CMA; 01/14/2015 11:30 AM) Colonoscopy within last year  Allergies Michael Blanchard, CMA; 01/14/2015 11:44 AM) Gabapentin *CHEMICALS* Lipitor *ANTIHYPERLIPIDEMICS* Niaspan *ANTIHYPERLIPIDEMICS*  Medication History Michael Blanchard, CMA; 01/14/2015 11:47 AM) AmLODIPine Besylate (2.5MG  Tablet, Oral) Active. CarBAMazepine (200MG  Tablet, Oral) Active. Hydrocodone-Acetaminophen (7.5-325MG  Tablet, Oral) Active. Clopidogrel Bisulfate (75MG  Tablet, Oral) Active. Donepezil HCl (10MG  Tablet, Oral) Active. Diclofenac Epolamine (1.3% Patch, Transdermal) Active. Losartan Potassium-HCTZ (100-25MG  Tablet, Oral) Active. Metoprolol Tartrate (50MG  Tablet, Oral) Active. Nitroglycerin (0.4MG  Tab Sublingual, Sublingual) Active. Tamsulosin HCl (0.4MG  Capsule, Oral) Active. Rosuvastatin Calcium (10MG  Tablet, Oral) Active. Pregabalin (50MG  Capsule, Oral) Active. Multiple Vitamin (Oral) Active. Calcium 600 (1500 (600 Ca)MG Tablet, Oral) Active. Osteo Bi-Flex Adv Joint Shield (Oral) Active. Medications Reconciled  Social History Michael Blanchard, [place]; 01/14/2015 11:30 AM) Caffeine use Carbonated beverages, Coffee. Tobacco use Former smoker.  Family History Michael Blanchard, [place]; 01/14/2015 11:30 AM) Alcohol Abuse Brother, Father, Mother, Sister, Son. Arthritis Daughter. Colon Polyps Daughter, Son. Diabetes Mellitus Daughter. Heart disease in male family member before age 53 Hypertension Son. Melanoma Daughter. Migraine Headache Daughter. Thyroid problems Daughter.     Review of Systems Michael Blanchard CMA; 01/14/2015 11:30 AM) General Present- Fatigue and Weight Loss. Not  Present- Appetite Loss, Chills, Fever, Night Sweats and Weight Gain. Skin Present- Dryness. Not Present- Change in Wart/Mole, Hives, Jaundice, New Lesions, Non-Healing Wounds, Rash and Ulcer. HEENT Present- Hearing Loss, Seasonal Allergies and Wears glasses/contact lenses. Not Present- Earache, Hoarseness, Nose Bleed, Oral Ulcers, Ringing in the Ears, Sinus Pain, Sore Throat, Visual  Disturbances and Yellow Eyes. Respiratory Present- Difficulty Breathing and Snoring. Not Present- Bloody sputum, Chronic Cough and Wheezing. Breast Not Present- Breast Mass, Breast Pain, Nipple Discharge and Skin Changes. Cardiovascular Present- Shortness of Breath. Not Present- Chest Pain, Difficulty Breathing Lying Down, Leg Cramps, Palpitations, Rapid Heart Rate and Swelling of Extremities. Gastrointestinal Present- Bloody Stool and Constipation. Not Present- Abdominal Pain, Bloating, Change in Bowel Habits, Chronic diarrhea, Difficulty Swallowing, Excessive gas, Gets full quickly at meals, Hemorrhoids, Indigestion, Nausea, Rectal Pain and Vomiting. Male Genitourinary Present- Change in Urinary Stream, Frequency, Impotence, Nocturia, Urgency and Urine Leakage. Not Present- Blood in Urine and Painful Urination. Musculoskeletal Present- Joint Pain and Muscle Weakness. Not Present- Back Pain, Joint Stiffness, Muscle Pain and Swelling of Extremities. Neurological Present- Decreased Memory, Trouble walking and Weakness. Not Present- Fainting, Headaches, Numbness, Seizures, Tingling and Tremor. Hematology Present- Easy Bruising. Not Present- Excessive bleeding, Gland problems, HIV and Persistent Infections.  Vitals Michael Blanchard CMA; 01/14/2015 11:43 AM) 01/14/2015 11:33 AM Height: 68in Weight Measurement Declined Temp.: 97.3F(Temporal)  Pulse: 60 (Regular)  Resp.: 16 (Unlabored)  BP: 130/80 (Sitting, Left Arm, Standard)      Physical Exam Michael Hector MD; 01/14/2015 12:34 PM)  General Mental  Status-Alert. General Appearance-Not in acute distress, Not Sickly. Orientation-Oriented X3. Hydration-Well hydrated. Voice-Normal.  Integumentary Global Assessment Upon inspection and palpation of skin surfaces of the - Axillae: non-tender, no inflammation or ulceration, no drainage. and Distribution of scalp and body hair is normal. General Characteristics Temperature - normal warmth is noted.  Head and Neck Head-normocephalic, atraumatic with no lesions or palpable masses. Face Global Assessment - atraumatic, no absence of expression. Neck Global Assessment - no abnormal movements, no bruit auscultated on the right, no bruit auscultated on the left, no decreased range of motion, non-tender. Trachea-midline. Thyroid Gland Characteristics - non-tender.  Eye Eyeball - Left-Extraocular movements intact, No Nystagmus. Eyeball - Right-Extraocular movements intact, No Nystagmus. Cornea - Left-No Hazy. Cornea - Right-No Hazy. Sclera/Conjunctiva - Left-No scleral icterus, No Discharge. Sclera/Conjunctiva - Right-No scleral icterus, No Discharge. Pupil - Left-Direct reaction to light normal. Pupil - Right-Direct reaction to light normal. Note: Wears glasses  ENMT Ears Pinna - Left - no drainage observed, no generalized tenderness observed. Right - no drainage observed, no generalized tenderness observed. Nose and Sinuses External Inspection of the Nose - no destructive lesion observed. Inspection of the nares - Left - quiet respiration. Right - quiet respiration. Mouth and Throat Lips - Upper Lip - no fissures observed, no pallor noted. Lower Lip - no fissures observed, no pallor noted. Nasopharynx - no discharge present. Oral Cavity/Oropharynx - Tongue - no dryness observed. Oral Mucosa - no cyanosis observed. Hypopharynx - no evidence of airway distress observed. Note: Not hard of hearing  Chest and Lung Exam Inspection Movements - Normal and  Symmetrical. Accessory muscles - No use of accessory muscles in breathing. Palpation Palpation of the chest reveals - Non-tender. Auscultation Breath sounds - Normal and Clear.  Cardiovascular Auscultation Rhythm - Regular. Murmurs & Other Heart Sounds - Auscultation of the heart reveals - No Murmurs and No Systolic Clicks.  Abdomen Inspection Inspection of the abdomen reveals - No Visible peristalsis and No Abnormal pulsations. Umbilicus - No Bleeding, No Urine drainage. Palpation/Percussion Palpation and Percussion of the abdomen reveal - Soft, Non Tender, No Rebound tenderness, No Rigidity (guarding) and No Cutaneous hyperesthesia. Note: Flat. Mild fullness consider with liver edge and RIGHT upper quadrant but no discrete mass. No diastases. No umbilical hernia.  Male Genitourinary Sexual Maturity Tanner 5 - Adult hair pattern and Adult penile size and shape. Note: Normal external genitalia. No inguinal hernias.  Peripheral Vascular Upper Extremity Inspection - Left - No Cyanotic nailbeds, Not Ischemic. Right - No Cyanotic nailbeds, Not Ischemic.  Neurologic Neurologic evaluation reveals -normal attention span and ability to concentrate, able to name objects and repeat phrases. Appropriate fund of knowledge , normal sensation and normal coordination. Mental Status Affect - not angry, not paranoid. Cranial Nerves-Normal Bilaterally. Gait-Normal.  Neuropsychiatric Mental status exam performed with findings of-able to articulate well with normal speech/language, rate, volume and coherence, thought content normal with ability to perform basic computations and apply abstract reasoning and no evidence of hallucinations, delusions, obsessions or homicidal/suicidal ideation.  Musculoskeletal Global Assessment Spine, Ribs and Pelvis - no instability, subluxation or laxity. Right Upper Extremity - no instability, subluxation or laxity. Note: Wears bilateral knee braces. No  significant edema. Moderate stiffness and RIGHT shoulder consistent with osteoarthritis  Lymphatic Head & Neck  General Head & Neck Lymphatics: Bilateral - Description - No Localized lymphadenopathy. Axillary  General Axillary Region: Bilateral - Description - No Localized lymphadenopathy. Femoral & Inguinal  Generalized Femoral & Inguinal Lymphatics: Left - Description - No Localized lymphadenopathy. Right - Description - No Localized lymphadenopathy.    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 Biopsy consistent with adenocarcinoma. Significant anemia on Plavix improved after transfusion.  Standard of care would be segmental colonic resection. Stent metastatic her stage IV disease with CT scans of chest abdomen and pelvis and CEA otherwise underwhelming.  He is of advanced age and some medical complexity, but he does have a decent quality of life. He wishes to be aggressive. I think that is reasonable as long as cardiology feels it is okay. Echo did show good ejection fraction. While his pig valve is 79 years old, it seems to be functioning okay with mild regurgitation. More concerned about the mitral valve stenosis but long as Michael. Mare Ferrari does not have a concern, would proceed with surgery.  Reasonable to do a minimally invasive approach to decrease pain and help speed recovery and minimize ileus. Robotic intracorporeal if possible.  His risks of surgery are increased with his cardiac history advanced age, but the patient wishes to be aggressive as well. I had a very long discussion with the patient, his wife, his children. They're guardedly hopeful and wish to proceed. We'll get cardiac clearance first.  Current Plans You are being scheduled for surgery - Our schedulers will call you.  You should hear from our office's scheduling department within 5 working days about the location, date, and time of  surgery. We try to make accommodations for patient's preferences in scheduling surgery, but sometimes the OR schedule or the surgeon's schedule prevents [place] from making those accommodations.  If you have not heard from our office [phone] in 5 working days, call the office and ask for your surgeon's nurse.  If you have other questions about your diagnosis, plan, or surgery, call the office and ask for your surgeon's nurse.  Written instructions provided I recommended obtaining preoperative cardiac clearance. I am concerned about the health of the patient and the ability to tolerate the operation. Therefore, we will request clearance by cardiology to better assess operative risk & see if a reevaluation, further workup, etc is needed. Also recommendations on how medications such as for anticoagulation and blood pressure should be  managed/held/restarted after surgery. The anatomy & physiology of the digestive tract was discussed. The pathophysiology of the colon was discussed. Natural history risks without surgery was discussed. I feel the risks of no intervention will lead to serious problems that outweigh the operative risks; therefore, I recommended a partial colectomy to remove the pathology. Minimally invasive (Robotic/Laparoscopic) & open techniques were discussed.  Risks such as bleeding, infection, abscess, leak, reoperation, possible ostomy, hernia, heart attack, death, and other risks were discussed. I noted a good likelihood this will help address the problem. Goals of post-operative recovery were discussed as well. Need for adequate nutrition, daily bowel regimen and healthy physical activity, to optimize recovery was noted as well. We will work to minimize complications. Educational materials were available as well. Questions were answered. The patient expresses understanding & wishes to proceed with surgery.  Pt Education - [facility] Colon Bowel Prep 2015 Miralax/Antibiotics Started Neomycin  Sulfate 500MG , 2 (two) Tablet SEE NOTE, #6, 01/14/2015, No Refill. Local Order: TAKE TWO TABLETS AT 2 PM, 3 PM, AND 10 PM THE DAY PRIOR TO SURGERY Started Flagyl 500MG , 2 (two) Tablet SEE NOTE, #6, 01/14/2015, No Refill. Local Order: Take at 2pm, 3pm, and 10pm the day prior to your colon operation Pt Education - [facility] Colectomy post-op instructions: discussed with patient and provided information. Pt Education - Pamphlet Given - Laparoscopic Colorectal Surgery: discussed with patient and provided information. Pt Education - [facility] Colorectal Cancer (AT): discussed with patient and provided information. Consider follow up colonoscopy by your gastroenterologist, depending on your diagnosis. Call your gastroenterologist for advice  If you had a colon or rectal cancer resected by surgery, you should strongly consider getting a colonoscopy by your gastroenterologict one year after the colon cancer was removed. If it was a benign polyp that was removed by colon resection, consider follow-up colonoscopy in about 3 years.  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 Hopefully a slow ooze.  We'll write for some iron make sure his cardiologist agrees.  Would keep on Plavix for now until time of surgery unless he has another bleedingevent or has a further drop in his hemoglobin.Marland Kitchen Hopefully will be surgery within the next few weeks if his cardiologist agrees  Current Plans Started Iron 325 (65 Fe)MG, 1 (one) Tablet three times daily, #60, 01/14/2015, Ref. x2.

## 2015-01-14 NOTE — Progress Notes (Signed)
Patient's echo shows normal LV systolic function. He is cleared for colon surgery. Okay to stop plavix 5 days before surgery.

## 2015-01-15 ENCOUNTER — Telehealth: Payer: Self-pay | Admitting: *Deleted

## 2015-01-15 NOTE — Telephone Encounter (Signed)
Spoke with patient and he stated he was feeling good and breathing much better Did advise Dr. Mare Ferrari has cleared him for his surgery and he did not need to keep the follow up appointment with Tera Helper NP on Monday 11/7 unless he felt it necessary (per  Dr. Mare Ferrari) Patient agreeable to cancelling appointment and will call back if anything changes

## 2015-01-15 NOTE — Progress Notes (Signed)
thanks

## 2015-01-20 ENCOUNTER — Ambulatory Visit: Payer: Medicare Other | Admitting: Nurse Practitioner

## 2015-01-21 DIAGNOSIS — D500 Iron deficiency anemia secondary to blood loss (chronic): Secondary | ICD-10-CM | POA: Diagnosis not present

## 2015-01-28 ENCOUNTER — Ambulatory Visit: Payer: Medicare Other | Admitting: Cardiology

## 2015-01-29 DIAGNOSIS — G894 Chronic pain syndrome: Secondary | ICD-10-CM | POA: Diagnosis not present

## 2015-01-29 DIAGNOSIS — M545 Low back pain: Secondary | ICD-10-CM | POA: Diagnosis not present

## 2015-01-29 DIAGNOSIS — Z79899 Other long term (current) drug therapy: Secondary | ICD-10-CM | POA: Diagnosis not present

## 2015-01-29 DIAGNOSIS — M792 Neuralgia and neuritis, unspecified: Secondary | ICD-10-CM | POA: Diagnosis not present

## 2015-02-14 ENCOUNTER — Encounter (HOSPITAL_COMMUNITY): Payer: Self-pay

## 2015-02-14 ENCOUNTER — Encounter (HOSPITAL_COMMUNITY)
Admission: RE | Admit: 2015-02-14 | Discharge: 2015-02-14 | Disposition: A | Discharge status: Home / Self Care | Payer: Medicare Other | Source: Ambulatory Visit | Attending: Surgery | Admitting: Surgery

## 2015-02-14 DIAGNOSIS — Z79899 Other long term (current) drug therapy: Secondary | ICD-10-CM | POA: Diagnosis not present

## 2015-02-14 DIAGNOSIS — Z01818 Encounter for other preprocedural examination: Secondary | ICD-10-CM | POA: Insufficient documentation

## 2015-02-14 DIAGNOSIS — C180 Malignant neoplasm of cecum: Secondary | ICD-10-CM | POA: Diagnosis not present

## 2015-02-14 HISTORY — DX: Chronic kidney disease, unspecified: N18.9

## 2015-02-14 HISTORY — DX: Cerebral infarction, unspecified: I63.9

## 2015-02-14 HISTORY — DX: Adverse effect of unspecified anesthetic, initial encounter: T41.45XA

## 2015-02-14 HISTORY — DX: Unspecified osteoarthritis, unspecified site: M19.90

## 2015-02-14 HISTORY — DX: Other complications of anesthesia, initial encounter: T88.59XA

## 2015-02-14 LAB — BASIC METABOLIC PANEL
Anion gap: 6 (ref 5–15)
BUN: 22 mg/dL — AB (ref 6–20)
CALCIUM: 9.4 mg/dL (ref 8.9–10.3)
CO2: 29 mmol/L (ref 22–32)
CREATININE: 1.19 mg/dL (ref 0.61–1.24)
Chloride: 106 mmol/L (ref 101–111)
GFR calc Af Amer: >60 mL/min (ref >60)
GFR, EST NON AFRICAN AMERICAN: 53 mL/min — AB (ref >60)
GLUCOSE: 112 mg/dL — AB (ref 65–99)
Potassium: 4.4 mmol/L (ref 3.5–5.1)
Sodium: 141 mmol/L (ref 135–145)

## 2015-02-14 LAB — CBC
HCT: 30.6 % — ABNORMAL LOW (ref 39.0–52.0)
Hemoglobin: 9.6 g/dL — ABNORMAL LOW (ref 13.0–17.0)
MCH: 25.9 pg — AB (ref 26.0–34.0)
MCHC: 31.4 g/dL (ref 30.0–36.0)
MCV: 82.5 fL (ref 78.0–100.0)
Platelets: 206 10*3/uL (ref 150–400)
RBC: 3.71 MIL/uL — ABNORMAL LOW (ref 4.22–5.81)
RDW: 25.5 % — AB (ref 11.5–15.5)
WBC: 6 10*3/uL (ref 4.0–10.5)

## 2015-02-14 NOTE — Progress Notes (Signed)
Dr. Johney Maine, please place new "Obtain Consent."  Current order has abbreviation (vs) and misspelling. Thank you!

## 2015-02-14 NOTE — Pre-Procedure Instructions (Signed)
EKG 01-09-15 epic Echo 01-07-15 epic CT Chest 01-13-15 epic

## 2015-02-14 NOTE — Patient Instructions (Addendum)
Michael Blanchard  02/14/2015   Your procedure is scheduled on: Wednesday 02-19-15  Report to [facility]  Entrance take [facility]  elevators to 3rd floor to  [place] at 10:00 AM.  Call this number if you have problems the morning of surgery [phone]   Remember: ONLY 1 PERSON MAY GO WITH YOU TO SHORT STAY TO GET  READY MORNING OF [place].  Do not eat food or drink liquids :After Midnight.     Take these medicines the morning of surgery with A SIP OF WATER: Amlodipine (Norvasc), Metoprolol DO NOT TAKE ANY DIABETIC MEDICATIONS DAY OF YOUR SURGERY                               You may not have any metal on your body including hair pins and              piercings  Do not wear jewelry, make-up, lotions, powders or perfumes, deodorant             Do not wear nail polish.  Do not shave  48 hours prior to surgery.              Men may shave face and neck.   Do not bring valuables to the hospital. [place].  Contacts, dentures or bridgework may not be worn into surgery.  Leave suitcase in the car. After surgery it may be brought to your room.     Special Instructions: practice deep breathing and leg exercises              Please read over the following fact sheets you were given: _____________________________________________________________________             [facility] - Preparing for Surgery Before surgery, you can play an important role.  Because skin is not sterile, your skin needs to be as free of germs as possible.  You can reduce the number of germs on your skin by washing with CHG (chlorahexidine gluconate) soap before surgery.  CHG is an antiseptic cleaner which kills germs and bonds with the skin to continue killing germs even after washing. Please DO NOT use if you have an allergy to CHG or antibacterial soaps.  If your skin becomes reddened/irritated stop using the CHG and inform your  nurse when you arrive at Short Stay. Do not shave (including legs and underarms) for at least 48 hours prior to the first CHG shower.  You may shave your face/neck. Please follow these instructions carefully:  1.  Shower with CHG Soap the night before surgery and the  morning of Surgery.  2.  If you choose to wash your hair, wash your hair first as usual with your  normal  shampoo.  3.  After you shampoo, rinse your hair and body thoroughly to remove the  shampoo.                           4.  Use CHG as you would any other liquid soap.  You can apply chg directly  to the skin and wash  Gently with a scrungie or clean washcloth.  5.  Apply the CHG Soap to your body ONLY FROM THE NECK DOWN.   Do not use on face/ open                           Wound or open sores. Avoid contact with eyes, ears mouth and genitals (private parts).                       Wash face,  Genitals (private parts) with your normal soap.             6.  Wash thoroughly, paying special attention to the area where your surgery  will be performed.  7.  Thoroughly rinse your body with warm water from the neck down.  8.  DO NOT shower/wash with your normal soap after using and rinsing off  the CHG Soap.                9.  Pat yourself dry with a clean towel.            10.  Wear clean pajamas.            11.  Place clean sheets on your bed the night of your first shower and do not  sleep with pets. Day of Surgery : Do not apply any lotions/deodorants the morning of surgery.  Please wear clean clothes to the hospital/surgery center.  FAILURE TO FOLLOW THESE INSTRUCTIONS MAY RESULT IN THE CANCELLATION OF YOUR SURGERY PATIENT SIGNATURE_________________________________  NURSE SIGNATURE__________________________________  ________________________________________________________________________  WHAT IS A BLOOD TRANSFUSION? Blood Transfusion Information  A transfusion is the replacement of blood or some of its  parts. Blood is made up of multiple cells which provide different functions.  Red blood cells carry oxygen and are used for blood loss replacement.  White blood cells fight against infection.  Platelets control bleeding.  Plasma helps clot blood.  Other blood products are available for specialized needs, such as hemophilia or other clotting disorders. BEFORE THE TRANSFUSION  Who gives blood for transfusions?   Healthy volunteers who are fully evaluated to make sure their blood is safe. This is blood bank blood. Transfusion therapy is the safest it has ever been in the practice of medicine. Before blood is taken from a donor, a complete history is taken to make sure that person has no history of diseases nor engages in risky social behavior (examples are intravenous drug use or sexual activity with multiple partners). The donor's travel history is screened to minimize risk of transmitting infections, such as malaria. The donated blood is tested for signs of infectious diseases, such as HIV and hepatitis. The blood is then tested to be sure it is compatible with you in order to minimize the chance of a transfusion reaction. If you or a relative donates blood, this is often done in anticipation of surgery and is not appropriate for emergency situations. It takes many days to process the donated blood. RISKS AND COMPLICATIONS Although transfusion therapy is very safe and saves many lives, the main dangers of transfusion include:   Getting an infectious disease.  Developing a transfusion reaction. This is an allergic reaction to something in the blood you were given. Every precaution is taken to prevent this. The decision to have a blood transfusion has been considered carefully by your caregiver before blood is given. Blood is not given unless the benefits outweigh  the risks. AFTER THE TRANSFUSION  Right after receiving a blood transfusion, you will usually feel much better and more energetic.  This is especially true if your red blood cells have gotten low (anemic). The transfusion raises the level of the red blood cells which carry oxygen, and this usually causes an energy increase.  The nurse administering the transfusion will monitor you carefully for complications. HOME CARE INSTRUCTIONS  No special instructions are needed after a transfusion. You may find your energy is better. Speak with your caregiver about any limitations on activity for underlying diseases you may have. SEEK MEDICAL CARE IF:   Your condition is not improving after your transfusion.  You develop redness or irritation at the intravenous (IV) site. SEEK IMMEDIATE MEDICAL CARE IF:  Any of the following symptoms occur over the next 12 hours:  Shaking chills.  You have a temperature by mouth above 102 F (38.9 C), not controlled by medicine.  Chest, back, or muscle pain.  People around you feel you are not acting correctly or are confused.  Shortness of breath or difficulty breathing.  Dizziness and fainting.  You get a rash or develop hives.  You have a decrease in urine output.  Your urine turns a dark color or changes to pink, red, or brown. Any of the following symptoms occur over the next 10 days:  You have a temperature by mouth above 102 F (38.9 C), not controlled by medicine.  Shortness of breath.  Weakness after normal activity.  The white part of the eye turns yellow (jaundice).  You have a decrease in the amount of urine or are urinating less often.  Your urine turns a dark color or changes to pink, red, or brown. Document Released: 02/27/2000 Document Revised: 05/24/2011 Document Reviewed: 10/16/2007 Exit[employer] Patient Information 2014 [employer], [place].  _______________________________________________________________________    CLEAR LIQUID DIET   Foods Allowed                                                                     Foods Excluded  Coffee and tea, regular  and decaf                             liquids that you cannot  Plain Jell-O in any flavor                                             see through such as: Fruit ices (not with fruit pulp)                                     milk, soups, orange juice  Iced Popsicles                                    All solid food Carbonated beverages, regular and diet  Cranberry, grape and apple juices Sports drinks like Gatorade Lightly seasoned clear broth or consume(fat free) Sugar, honey syrup  Sample Menu Breakfast                                Lunch                                     Supper Cranberry juice                    Beef broth                            Chicken broth Jell-O                                     Grape juice                           Apple juice Coffee or tea                        Jell-O                                      Popsicle                                                Coffee or tea                        Coffee or tea  _____________________________________________________________________

## 2015-02-15 LAB — HEMOGLOBIN A1C
HEMOGLOBIN A1C: 5.5 % (ref 4.8–5.6)
Mean Plasma Glucose: 111 mg/dL

## 2015-02-17 ENCOUNTER — Ambulatory Visit: Payer: Medicare Other | Admitting: Cardiology

## 2015-02-17 ENCOUNTER — Other Ambulatory Visit: Payer: Self-pay | Admitting: Surgery

## 2015-02-18 ENCOUNTER — Other Ambulatory Visit: Payer: Self-pay | Admitting: Surgery

## 2015-02-18 MED ORDER — SODIUM CHLORIDE 0.9 % IV SOLN
INTRAVENOUS | Status: DC
  Filled 2015-02-18: qty 6

## 2015-02-18 MED ORDER — SODIUM CHLORIDE 0.9 % IV SOLN: 1.0000 "application " | INTRAVENOUS | Status: DC

## 2015-02-18 NOTE — H&P (Signed)
[facility] 01/14/2015 11:30 AM Location: [facility] Patient #: [id number] DOB: 12-15-1925 Married / Language: English / Race: White Male   History of Present Illness Michael Hector MD; 01/14/2015 12:43 PM) The patient is a 81 year old male who presents with colorectal cancer. Patient sent by Dr. Shanon Brow Tat with [facility] for diagnosis of cecal cancer causing bleeding.  Pleasant elderly gentleman. Has significant osteoarthritis and chronic pain on chronic narcotics. Trigeminal neuralgia as well. Takes care of his wife who is blind and wheelchair ridden. As one daughter that helps almost every day. Other family members traveling from other parts of the country. Patient history coronary disease status bypass graft as well as valve replacement on chronic Plavix anticoagulation. That was in 2001. Noted worsening dyspnea over the past few months. Found to be severely anemic in the cardiology office. Admitted. Transfuse. CT scan showing some thickening in the ileocecal region. Endoscopy by Dr Watt Climes confirmed a mass. Biopsy consistent with adenocarcinoma. Surgical consultation requested. Transfuse. Stabilized. Patient comes today with family to discuss if surgery is needed.  Patient walks with a cane. He has knee braces. He claims he somewhat independent but family has concerns. Normally has a bowel movement about every day with the help of a lot of stool softeners since he is on chronic narcotics. He's never had abdominal surgery. A heart bypass surgery no other operations. No difficulty with swallowing or dysphagia. I don't believe he was sent home on iron. He is back to taking his Plavix for now.   Other Problems Elbert Ewings, CMA; 01/14/2015 11:30 AM) Arthritis Cerebrovascular Accident Colon Cancer High blood pressure Hypercholesterolemia  Past Surgical History Elbert Ewings, CMA; 01/14/2015 11:30 AM) Coronary Artery Bypass  Graft Tonsillectomy Valve Replacement  Diagnostic Studies History Elbert Ewings, CMA; 01/14/2015 11:30 AM) Colonoscopy within last year  Allergies Elbert Ewings, CMA; 01/14/2015 11:44 AM) Gabapentin *CHEMICALS* Lipitor *ANTIHYPERLIPIDEMICS* Niaspan *ANTIHYPERLIPIDEMICS*  Medication History Elbert Ewings, CMA; 01/14/2015 11:47 AM) AmLODIPine Besylate (2.5MG  Tablet, Oral) Active. CarBAMazepine (200MG  Tablet, Oral) Active. Hydrocodone-Acetaminophen (7.5-325MG  Tablet, Oral) Active. Clopidogrel Bisulfate (75MG  Tablet, Oral) Active. Donepezil HCl (10MG  Tablet, Oral) Active. Diclofenac Epolamine (1.3% Patch, Transdermal) Active. Losartan Potassium-HCTZ (100-25MG  Tablet, Oral) Active. Metoprolol Tartrate (50MG  Tablet, Oral) Active. Nitroglycerin (0.4MG  Tab Sublingual, Sublingual) Active. Tamsulosin HCl (0.4MG  Capsule, Oral) Active. Rosuvastatin Calcium (10MG  Tablet, Oral) Active. Pregabalin (50MG  Capsule, Oral) Active. Multiple Vitamin (Oral) Active. Calcium 600 (1500 (600 Ca)MG Tablet, Oral) Active. Osteo Bi-Flex Adv Joint Shield (Oral) Active. Medications Reconciled  Social History Elbert Ewings, [place]; 01/14/2015 11:30 AM) Caffeine use Carbonated beverages, Coffee. Tobacco use Former smoker.  Family History Elbert Ewings, [place]; 01/14/2015 11:30 AM) Alcohol Abuse Brother, Father, Mother, Sister, Son. Arthritis Daughter. Colon Polyps Daughter, Son. Diabetes Mellitus Daughter. Heart disease in male family member before age 6 Hypertension Son. Melanoma Daughter. Migraine Headache Daughter. Thyroid problems Daughter.    Review of Systems Elbert Ewings CMA; 01/14/2015 11:30 AM) General Present- Fatigue and Weight Loss. Not Present- Appetite Loss, Chills, Fever, Night Sweats and Weight Gain. Skin Present- Dryness. Not Present- Change in Wart/Mole, Hives, Jaundice, New Lesions, Non-Healing Wounds, Rash and Ulcer. HEENT Present- Hearing Loss, Seasonal Allergies  and Wears glasses/contact lenses. Not Present- Earache, Hoarseness, Nose Bleed, Oral Ulcers, Ringing in the Ears, Sinus Pain, Sore Throat, Visual Disturbances and Yellow Eyes. Respiratory Present- Difficulty Breathing and Snoring. Not Present- Bloody sputum, Chronic Cough and Wheezing. Breast Not Present- Breast Mass, Breast Pain, Nipple Discharge and Skin Changes. Cardiovascular Present- Shortness of Breath. Not  Present- Chest Pain, Difficulty Breathing Lying Down, Leg Cramps, Palpitations, Rapid Heart Rate and Swelling of Extremities. Gastrointestinal Present- Bloody Stool and Constipation. Not Present- Abdominal Pain, Bloating, Change in Bowel Habits, Chronic diarrhea, Difficulty Swallowing, Excessive gas, Gets full quickly at meals, Hemorrhoids, Indigestion, Nausea, Rectal Pain and Vomiting. Male Genitourinary Present- Change in Urinary Stream, Frequency, Impotence, Nocturia, Urgency and Urine Leakage. Not Present- Blood in Urine and Painful Urination. Musculoskeletal Present- Joint Pain and Muscle Weakness. Not Present- Back Pain, Joint Stiffness, Muscle Pain and Swelling of Extremities. Neurological Present- Decreased Memory, Trouble walking and Weakness. Not Present- Fainting, Headaches, Numbness, Seizures, Tingling and Tremor. Hematology Present- Easy Bruising. Not Present- Excessive bleeding, Gland problems, HIV and Persistent Infections.  Vitals Elbert Ewings CMA; 01/14/2015 11:43 AM) 01/14/2015 11:33 AM Height: 68in Weight Measurement Declined Temp.: 97.31F(Temporal)  Pulse: 60 (Regular)  Resp.: 16 (Unlabored)  BP: 130/80 (Sitting, Left Arm, Standard)       Physical Exam Michael Hector MD; 01/14/2015 12:34 PM) General Mental Status-Alert. General Appearance-Not in acute distress, Not Sickly. Orientation-Oriented X3. Hydration-Well hydrated. Voice-Normal.  Integumentary Global Assessment Upon inspection and palpation of skin surfaces of the - Axillae:  non-tender, no inflammation or ulceration, no drainage. and Distribution of scalp and body hair is normal. General Characteristics Temperature - normal warmth is noted.  Head and Neck Head-normocephalic, atraumatic with no lesions or palpable masses. Face Global Assessment - atraumatic, no absence of expression. Neck Global Assessment - no abnormal movements, no bruit auscultated on the right, no bruit auscultated on the left, no decreased range of motion, non-tender. Trachea-midline. Thyroid Gland Characteristics - non-tender.  Eye Eyeball - Left-Extraocular movements intact, No Nystagmus. Eyeball - Right-Extraocular movements intact, No Nystagmus. Cornea - Left-No Hazy. Cornea - Right-No Hazy. Sclera/Conjunctiva - Left-No scleral icterus, No Discharge. Sclera/Conjunctiva - Right-No scleral icterus, No Discharge. Pupil - Left-Direct reaction to light normal. Pupil - Right-Direct reaction to light normal. Note: Wears glasses   ENMT Ears Pinna - Left - no drainage observed, no generalized tenderness observed. Right - no drainage observed, no generalized tenderness observed. Nose and Sinuses External Inspection of the Nose - no destructive lesion observed. Inspection of the nares - Left - quiet respiration. Right - quiet respiration. Mouth and Throat Lips - Upper Lip - no fissures observed, no pallor noted. Lower Lip - no fissures observed, no pallor noted. Nasopharynx - no discharge present. Oral Cavity/Oropharynx - Tongue - no dryness observed. Oral Mucosa - no cyanosis observed. Hypopharynx - no evidence of airway distress observed. Note: Not hard of hearing   Chest and Lung Exam Inspection Movements - Normal and Symmetrical. Accessory muscles - No use of accessory muscles in breathing. Palpation Palpation of the chest reveals - Non-tender. Auscultation Breath sounds - Normal and Clear.  Cardiovascular Auscultation Rhythm - Regular. Murmurs & Other  Heart Sounds - Auscultation of the heart reveals - No Murmurs and No Systolic Clicks.  Abdomen Inspection Inspection of the abdomen reveals - No Visible peristalsis and No Abnormal pulsations. Umbilicus - No Bleeding, No Urine drainage. Palpation/Percussion Palpation and Percussion of the abdomen reveal - Soft, Non Tender, No Rebound tenderness, No Rigidity (guarding) and No Cutaneous hyperesthesia. Note: Flat. Mild fullness consider with liver edge and RIGHT upper quadrant but no discrete mass. No diastases. No umbilical hernia.   Male Genitourinary Sexual Maturity Tanner 5 - Adult hair pattern and Adult penile size and shape. Note: Normal external genitalia. No inguinal hernias.   Peripheral Vascular Upper Extremity Inspection - Left -  No Cyanotic nailbeds, Not Ischemic. Right - No Cyanotic nailbeds, Not Ischemic.  Neurologic Neurologic evaluation reveals -normal attention span and ability to concentrate, able to name objects and repeat phrases. Appropriate fund of knowledge , normal sensation and normal coordination. Mental Status Affect - not angry, not paranoid. Cranial Nerves-Normal Bilaterally. Gait-Normal.  Neuropsychiatric Mental status exam performed with findings of-able to articulate well with normal speech/language, rate, volume and coherence, thought content normal with ability to perform basic computations and apply abstract reasoning and no evidence of hallucinations, delusions, obsessions or homicidal/suicidal ideation.  Musculoskeletal Global Assessment Spine, Ribs and Pelvis - no instability, subluxation or laxity. Right Upper Extremity - no instability, subluxation or laxity. Note: Wears bilateral knee braces. No significant edema. Moderate stiffness and RIGHT shoulder consistent with osteoarthritis   Lymphatic Head & Neck  General Head & Neck Lymphatics: Bilateral - Description - No Localized lymphadenopathy. Axillary  General Axillary Region:  Bilateral - Description - No Localized lymphadenopathy. Femoral & Inguinal  Generalized Femoral & Inguinal Lymphatics: Left - Description - No Localized lymphadenopathy. Right - Description - No Localized lymphadenopathy.    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 Biopsy consistent with adenocarcinoma. Significant anemia on Plavix improved after transfusion.  Standard of care would be segmental colonic resection. This is hopefully an early stage since metastatic workup has ruled out obvious stage IV disease. CT scans of chest abdomen and pelvis and CEA otherwise underwhelming.  He is of advanced age and some medical complexity, but he does have a decent quality of life. I do not think we are in a situation of hospice. He wishes to be aggressive. I think that is reasonable as long as cardiology feels it is okay. Family mostly on board but understandably concerned of risks. I worry the risk of more serious problems without surgery is imminent since he's already had symptomatic anemia. Risks of surgical intervention less if we can do this in an elective setting. Echo did show good ejection fraction. While his pig valve is 79 years old, it seems to be functioning okay with mild regurgitation. More concerned about the mitral valve stenosis but long as Dr. Mare Ferrari does not have a concern, would proceed with surgery.  Reasonable to do a minimally invasive approach to decrease pain and help speed recovery and minimize ileus. Robotic intracorporeal if possible.  His risks of surgery are increased with his cardiac history advanced age, but the patient wishes to be aggressive as well. I had a very long discussion with the patient, his wife, his children. They're guardedly hopeful and wish to proceed. We'll get cardiac clearance first. Current Plans You are being scheduled for surgery - Our schedulers will call you.  You should  hear from our office's scheduling department within 5 working days about the location, date, and time of surgery. We try to make accommodations for patient's preferences in scheduling surgery, but sometimes the OR schedule or the surgeon's schedule prevents [place] from making those accommodations.  If you have not heard from our office [phone]) in 5 working days, call the office and ask for your surgeon's nurse.  If you have other questions about your diagnosis, plan, or surgery, call the office and ask for your surgeon's nurse.  Written instructions provided I recommended obtaining preoperative cardiac clearance. I am concerned about the health of the patient and the ability to tolerate the operation. Therefore, we will request clearance by cardiology to  better assess operative risk & see if a reevaluation, further workup, etc is needed. Also recommendations on how medications such as for anticoagulation and blood pressure should be managed/held/restarted after surgery. The anatomy & physiology of the digestive tract was discussed. The pathophysiology of the colon was discussed. Natural history risks without surgery was discussed. I feel the risks of no intervention will lead to serious problems that outweigh the operative risks; therefore, I recommended a partial colectomy to remove the pathology. Minimally invasive (Robotic/Laparoscopic) & open techniques were discussed.  Risks such as bleeding, infection, abscess, leak, reoperation, possible ostomy, hernia, heart attack, death, and other risks were discussed. I noted a good likelihood this will help address the problem. Goals of post-operative recovery were discussed as well. Need for adequate nutrition, daily bowel regimen and healthy physical activity, to optimize recovery was noted as well. We will work to minimize complications. Educational materials were available as well. Questions were answered. The patient expresses understanding & wishes to  proceed with surgery.  Pt Education - [facility] Colon Bowel Prep 2015 Miralax/Antibiotics Started Neomycin Sulfate 500MG , 2 (two) Tablet SEE NOTE, #6, 01/14/2015, No Refill. Local Order: TAKE TWO TABLETS AT 2 PM, 3 PM, AND 10 PM THE DAY PRIOR TO SURGERY Started Flagyl 500MG , 2 (two) Tablet SEE NOTE, #6, 01/14/2015, No Refill. Local Order: Take at 2pm, 3pm, and 10pm the day prior to your colon operation Pt Education - [facility] Colectomy post-op instructions: discussed with patient and provided information. Pt Education - Pamphlet Given - Laparoscopic Colorectal Surgery: discussed with patient and provided information. Pt Education - [facility] Colorectal Cancer (AT): discussed with patient and provided information. Consider follow up colonoscopy by your gastroenterologist, depending on your diagnosis. Call your gastroenterologist for advice  If you had a colon or rectal cancer resected by surgery, you should strongly consider getting a colonoscopy by your gastroenterologict one year after the colon cancer was removed. If it was a benign polyp that was removed by colon resection, consider follow-up colonoscopy in about 3 years.  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 Hopefully a slow ooze.  We'll write for some iron make sure his cardiologist agrees.  Would keep on Plavix for now until time of surgery unless he has another bleedingevent or has a further drop in his hemoglobin.Marland Kitchen Hopefully will be surgery within the next few weeks if his cardiologist agrees Current Plans Started Iron 325 (65 Fe)MG, 1 (one) Tablet three times daily, #60, 01/14/2015, Ref. x2.  Michael Blanchard, [name], F.A.C.S. Gastrointestinal and Minimally Invasive Surgery [facility] [address] [place], [place] [address] [phone] Main / Paging

## 2015-02-19 ENCOUNTER — Encounter (HOSPITAL_COMMUNITY): Payer: Self-pay | Admitting: *Deleted

## 2015-02-19 ENCOUNTER — Inpatient Hospital Stay (HOSPITAL_COMMUNITY)
Admission: RE | Admit: 2015-02-19 | Discharge: 2015-02-24 | DRG: 330 | Disposition: A | Discharge status: Skilled Nursing Facility | Payer: Medicare Other | Source: Ambulatory Visit | Attending: Surgery | Admitting: Surgery

## 2015-02-19 ENCOUNTER — Encounter (HOSPITAL_COMMUNITY)
Admission: RE | Disposition: A | Discharge status: Skilled Nursing Facility | Payer: Self-pay | Source: Ambulatory Visit | Attending: Surgery

## 2015-02-19 ENCOUNTER — Inpatient Hospital Stay (HOSPITAL_COMMUNITY): Payer: Medicare Other | Admitting: Certified Registered"

## 2015-02-19 DIAGNOSIS — E785 Hyperlipidemia, unspecified: Secondary | ICD-10-CM | POA: Diagnosis present

## 2015-02-19 DIAGNOSIS — N179 Acute kidney failure, unspecified: Secondary | ICD-10-CM | POA: Diagnosis not present

## 2015-02-19 DIAGNOSIS — I359 Nonrheumatic aortic valve disorder, unspecified: Secondary | ICD-10-CM | POA: Diagnosis present

## 2015-02-19 DIAGNOSIS — Z823 Family history of stroke: Secondary | ICD-10-CM

## 2015-02-19 DIAGNOSIS — Z483 Aftercare following surgery for neoplasm: Secondary | ICD-10-CM | POA: Diagnosis not present

## 2015-02-19 DIAGNOSIS — R278 Other lack of coordination: Secondary | ICD-10-CM | POA: Diagnosis not present

## 2015-02-19 DIAGNOSIS — N183 Chronic kidney disease, stage 3 unspecified: Secondary | ICD-10-CM | POA: Diagnosis present

## 2015-02-19 DIAGNOSIS — I959 Hypotension, unspecified: Secondary | ICD-10-CM | POA: Diagnosis not present

## 2015-02-19 DIAGNOSIS — I10 Essential (primary) hypertension: Secondary | ICD-10-CM | POA: Diagnosis present

## 2015-02-19 DIAGNOSIS — Z8249 Family history of ischemic heart disease and other diseases of the circulatory system: Secondary | ICD-10-CM

## 2015-02-19 DIAGNOSIS — C182 Malignant neoplasm of ascending colon: Secondary | ICD-10-CM | POA: Diagnosis present

## 2015-02-19 DIAGNOSIS — E7800 Pure hypercholesterolemia, unspecified: Secondary | ICD-10-CM | POA: Diagnosis present

## 2015-02-19 DIAGNOSIS — Z8673 Personal history of transient ischemic attack (TIA), and cerebral infarction without residual deficits: Secondary | ICD-10-CM | POA: Diagnosis not present

## 2015-02-19 DIAGNOSIS — Z79891 Long term (current) use of opiate analgesic: Secondary | ICD-10-CM

## 2015-02-19 DIAGNOSIS — D500 Iron deficiency anemia secondary to blood loss (chronic): Secondary | ICD-10-CM | POA: Diagnosis present

## 2015-02-19 DIAGNOSIS — I2510 Atherosclerotic heart disease of native coronary artery without angina pectoris: Secondary | ICD-10-CM | POA: Diagnosis present

## 2015-02-19 DIAGNOSIS — M1990 Unspecified osteoarthritis, unspecified site: Secondary | ICD-10-CM | POA: Diagnosis present

## 2015-02-19 DIAGNOSIS — I209 Angina pectoris, unspecified: Secondary | ICD-10-CM | POA: Diagnosis not present

## 2015-02-19 DIAGNOSIS — C180 Malignant neoplasm of cecum: Principal | ICD-10-CM | POA: Diagnosis present

## 2015-02-19 DIAGNOSIS — D649 Anemia, unspecified: Secondary | ICD-10-CM | POA: Diagnosis present

## 2015-02-19 DIAGNOSIS — Z7901 Long term (current) use of anticoagulants: Secondary | ICD-10-CM | POA: Diagnosis not present

## 2015-02-19 DIAGNOSIS — Z7902 Long term (current) use of antithrombotics/antiplatelets: Secondary | ICD-10-CM | POA: Diagnosis not present

## 2015-02-19 DIAGNOSIS — I129 Hypertensive chronic kidney disease with stage 1 through stage 4 chronic kidney disease, or unspecified chronic kidney disease: Secondary | ICD-10-CM | POA: Diagnosis not present

## 2015-02-19 DIAGNOSIS — I1310 Hypertensive heart and chronic kidney disease without heart failure, with stage 1 through stage 4 chronic kidney disease, or unspecified chronic kidney disease: Secondary | ICD-10-CM | POA: Diagnosis present

## 2015-02-19 DIAGNOSIS — K4090 Unilateral inguinal hernia, without obstruction or gangrene, not specified as recurrent: Secondary | ICD-10-CM | POA: Diagnosis present

## 2015-02-19 DIAGNOSIS — Z87891 Personal history of nicotine dependence: Secondary | ICD-10-CM | POA: Diagnosis not present

## 2015-02-19 DIAGNOSIS — C801 Malignant (primary) neoplasm, unspecified: Secondary | ICD-10-CM | POA: Diagnosis not present

## 2015-02-19 DIAGNOSIS — M6218 Other rupture of muscle (nontraumatic), other site: Secondary | ICD-10-CM | POA: Diagnosis not present

## 2015-02-19 DIAGNOSIS — G500 Trigeminal neuralgia: Secondary | ICD-10-CM | POA: Diagnosis present

## 2015-02-19 DIAGNOSIS — F0390 Unspecified dementia without behavioral disturbance: Secondary | ICD-10-CM | POA: Diagnosis not present

## 2015-02-19 DIAGNOSIS — Z808 Family history of malignant neoplasm of other organs or systems: Secondary | ICD-10-CM

## 2015-02-19 DIAGNOSIS — Z01812 Encounter for preprocedural laboratory examination: Secondary | ICD-10-CM | POA: Diagnosis not present

## 2015-02-19 DIAGNOSIS — Z953 Presence of xenogenic heart valve: Secondary | ICD-10-CM | POA: Diagnosis not present

## 2015-02-19 DIAGNOSIS — I25810 Atherosclerosis of coronary artery bypass graft(s) without angina pectoris: Secondary | ICD-10-CM | POA: Diagnosis not present

## 2015-02-19 DIAGNOSIS — Z888 Allergy status to other drugs, medicaments and biological substances status: Secondary | ICD-10-CM | POA: Diagnosis not present

## 2015-02-19 DIAGNOSIS — E860 Dehydration: Secondary | ICD-10-CM | POA: Diagnosis not present

## 2015-02-19 DIAGNOSIS — Z952 Presence of prosthetic heart valve: Secondary | ICD-10-CM

## 2015-02-19 DIAGNOSIS — R2689 Other abnormalities of gait and mobility: Secondary | ICD-10-CM | POA: Diagnosis not present

## 2015-02-19 DIAGNOSIS — N189 Chronic kidney disease, unspecified: Secondary | ICD-10-CM | POA: Diagnosis not present

## 2015-02-19 DIAGNOSIS — I119 Hypertensive heart disease without heart failure: Secondary | ICD-10-CM | POA: Diagnosis present

## 2015-02-19 DIAGNOSIS — G8929 Other chronic pain: Secondary | ICD-10-CM | POA: Diagnosis present

## 2015-02-19 DIAGNOSIS — Z79899 Other long term (current) drug therapy: Secondary | ICD-10-CM

## 2015-02-19 DIAGNOSIS — Z951 Presence of aortocoronary bypass graft: Secondary | ICD-10-CM | POA: Diagnosis not present

## 2015-02-19 DIAGNOSIS — K5900 Constipation, unspecified: Secondary | ICD-10-CM | POA: Diagnosis not present

## 2015-02-19 LAB — ABO/RH: ABO/RH(D): O POS

## 2015-02-19 SURGERY — COLECTOMY, PARTIAL, ROBOT-ASSISTED, LAPAROSCOPIC
Anesthesia: General | Site: Abdomen | Laterality: Right

## 2015-02-19 SURGERY — Surgical Case
Anesthesia: *Unknown

## 2015-02-19 MED ORDER — LIDOCAINE HCL (PF) 2 % IJ SOLN
INTRAMUSCULAR | Status: DC | PRN
  Administered 2015-02-19: 30 mg via INTRADERMAL

## 2015-02-19 MED ORDER — DEXTROSE 5 % IV SOLN
2.0000 g | Freq: Two times a day (BID) | INTRAVENOUS | Status: AC
  Administered 2015-02-19: 2 g via INTRAVENOUS
  Filled 2015-02-19: qty 2

## 2015-02-19 MED ORDER — DEXTROSE 5 % IV SOLN
2.0000 g | INTRAVENOUS | Status: AC
  Administered 2015-02-19: 2 g via INTRAVENOUS

## 2015-02-19 MED ORDER — PROPOFOL 10 MG/ML IV BOLUS
INTRAVENOUS | Status: DC | PRN
  Administered 2015-02-19: 110 mg via INTRAVENOUS

## 2015-02-19 MED ORDER — FENTANYL CITRATE (PF) 100 MCG/2ML IJ SOLN
INTRAMUSCULAR | Status: AC
  Filled 2015-02-19: qty 2

## 2015-02-19 MED ORDER — ONDANSETRON HCL 4 MG/2ML IJ SOLN
4.0000 mg | Freq: Four times a day (QID) | INTRAMUSCULAR | Status: DC | PRN
  Administered 2015-02-20 – 2015-02-21 (×2): 4 mg via INTRAVENOUS
  Filled 2015-02-19 (×2): qty 2

## 2015-02-19 MED ORDER — CHLORHEXIDINE GLUCONATE 4 % EX LIQD: 60.0000 mL | Freq: Once | CUTANEOUS | Status: DC

## 2015-02-19 MED ORDER — SODIUM CHLORIDE 0.9 % IV SOLN
INTRAVENOUS | Status: DC | PRN
  Administered 2015-02-19: 1000 mL

## 2015-02-19 MED ORDER — HYDROMORPHONE HCL 1 MG/ML IJ SOLN
0.5000 mg | INTRAMUSCULAR | Status: DC | PRN
  Administered 2015-02-20 – 2015-02-21 (×4): 1 mg via INTRAVENOUS
  Filled 2015-02-19 (×4): qty 1

## 2015-02-19 MED ORDER — DONEPEZIL HCL 10 MG PO TABS
10.0000 mg | ORAL_TABLET | Freq: Every day | ORAL | Status: DC
  Administered 2015-02-19 – 2015-02-23 (×5): 10 mg via ORAL
  Filled 2015-02-19 (×6): qty 1

## 2015-02-19 MED ORDER — BUPIVACAINE-EPINEPHRINE (PF) 0.25% -1:200000 IJ SOLN
INTRAMUSCULAR | Status: AC
  Filled 2015-02-19: qty 30

## 2015-02-19 MED ORDER — SENNOSIDES-DOCUSATE SODIUM 8.6-50 MG PO TABS: 1.0000 | ORAL_TABLET | Freq: Every evening | ORAL | Status: DC | PRN

## 2015-02-19 MED ORDER — ROCURONIUM BROMIDE 100 MG/10ML IV SOLN
INTRAVENOUS | Status: DC | PRN
  Administered 2015-02-19 (×2): 10 mg via INTRAVENOUS
  Administered 2015-02-19: 20 mg via INTRAVENOUS
  Administered 2015-02-19 (×3): 10 mg via INTRAVENOUS
  Administered 2015-02-19: 60 mg via INTRAVENOUS

## 2015-02-19 MED ORDER — PROPOFOL 10 MG/ML IV BOLUS
INTRAVENOUS | Status: AC
  Filled 2015-02-19: qty 20

## 2015-02-19 MED ORDER — BUPIVACAINE LIPOSOME 1.3 % IJ SUSP
20.0000 mL | INTRAMUSCULAR | Status: DC
Start: 2015-02-19 — End: 2015-02-19
  Filled 2015-02-19: qty 20

## 2015-02-19 MED ORDER — ROSUVASTATIN CALCIUM 5 MG PO TABS
5.0000 mg | ORAL_TABLET | ORAL | Status: DC
  Administered 2015-02-20 – 2015-02-22 (×2): 5 mg via ORAL
  Filled 2015-02-19 (×2): qty 1

## 2015-02-19 MED ORDER — ACETAMINOPHEN 325 MG PO TABS: 325.0000 mg | ORAL_TABLET | Freq: Four times a day (QID) | ORAL | Status: DC | PRN

## 2015-02-19 MED ORDER — MENTHOL 3 MG MT LOZG
1.0000 | LOZENGE | OROMUCOSAL | Status: DC | PRN
  Filled 2015-02-19: qty 9

## 2015-02-19 MED ORDER — PHENOL 1.4 % MT LIQD
2.0000 | OROMUCOSAL | Status: DC | PRN
  Filled 2015-02-19: qty 177

## 2015-02-19 MED ORDER — ROCURONIUM BROMIDE 100 MG/10ML IV SOLN
INTRAVENOUS | Status: AC
  Filled 2015-02-19: qty 1

## 2015-02-19 MED ORDER — PROMETHAZINE HCL 25 MG/ML IJ SOLN
6.2500 mg | INTRAMUSCULAR | Status: DC | PRN
  Administered 2015-02-19: 12.5 mg via INTRAVENOUS
  Filled 2015-02-19: qty 1

## 2015-02-19 MED ORDER — ONDANSETRON HCL 4 MG/2ML IJ SOLN
INTRAMUSCULAR | Status: DC | PRN
  Administered 2015-02-19: 4 mg via INTRAVENOUS

## 2015-02-19 MED ORDER — ALVIMOPAN 12 MG PO CAPS
12.0000 mg | ORAL_CAPSULE | Freq: Once | ORAL | Status: AC
  Administered 2015-02-19: 12 mg via ORAL
  Filled 2015-02-19: qty 1

## 2015-02-19 MED ORDER — METOPROLOL TARTRATE 1 MG/ML IV SOLN
5.0000 mg | Freq: Four times a day (QID) | INTRAVENOUS | Status: DC | PRN
  Administered 2015-02-19: 5 mg via INTRAVENOUS
  Filled 2015-02-19 (×2): qty 5

## 2015-02-19 MED ORDER — MAGIC MOUTHWASH
15.0000 mL | Freq: Four times a day (QID) | ORAL | Status: DC | PRN
  Filled 2015-02-19: qty 15

## 2015-02-19 MED ORDER — METOPROLOL TARTRATE 12.5 MG HALF TABLET
12.5000 mg | ORAL_TABLET | Freq: Two times a day (BID) | ORAL | Status: DC
  Administered 2015-02-19: 12.5 mg via ORAL
  Filled 2015-02-19 (×3): qty 1

## 2015-02-19 MED ORDER — SODIUM CHLORIDE 0.9 % IJ SOLN
3.0000 mL | Freq: Two times a day (BID) | INTRAMUSCULAR | Status: DC
  Administered 2015-02-19 – 2015-02-23 (×6): 3 mL via INTRAVENOUS

## 2015-02-19 MED ORDER — ALVIMOPAN 12 MG PO CAPS
12.0000 mg | ORAL_CAPSULE | Freq: Two times a day (BID) | ORAL | Status: DC
  Administered 2015-02-20 – 2015-02-21 (×4): 12 mg via ORAL
  Filled 2015-02-19 (×6): qty 1

## 2015-02-19 MED ORDER — ONDANSETRON HCL 4 MG/2ML IJ SOLN
INTRAMUSCULAR | Status: AC
  Filled 2015-02-19: qty 2

## 2015-02-19 MED ORDER — DIPHENHYDRAMINE HCL 50 MG/ML IJ SOLN: 12.5000 mg | Freq: Four times a day (QID) | INTRAMUSCULAR | Status: DC | PRN

## 2015-02-19 MED ORDER — SODIUM CHLORIDE 0.9 % IV SOLN: 250.0000 mL | INTRAVENOUS | Status: DC | PRN

## 2015-02-19 MED ORDER — SODIUM CHLORIDE 0.9 % IJ SOLN: 3.0000 mL | INTRAMUSCULAR | Status: DC | PRN

## 2015-02-19 MED ORDER — LABETALOL HCL 5 MG/ML IV SOLN
INTRAVENOUS | Status: AC
  Filled 2015-02-19: qty 4

## 2015-02-19 MED ORDER — FENTANYL CITRATE (PF) 250 MCG/5ML IJ SOLN
INTRAMUSCULAR | Status: AC
  Filled 2015-02-19: qty 5

## 2015-02-19 MED ORDER — CARBAMAZEPINE 200 MG PO TABS
200.0000 mg | ORAL_TABLET | Freq: Two times a day (BID) | ORAL | Status: DC
  Administered 2015-02-19 – 2015-02-24 (×10): 200 mg via ORAL
  Filled 2015-02-19 (×11): qty 1

## 2015-02-19 MED ORDER — ONDANSETRON HCL 4 MG PO TABS: 4.0000 mg | ORAL_TABLET | Freq: Four times a day (QID) | ORAL | Status: DC | PRN

## 2015-02-19 MED ORDER — HYDROCODONE-ACETAMINOPHEN 7.5-325 MG PO TABS: 1.0000 | ORAL_TABLET | ORAL | Status: DC | PRN

## 2015-02-19 MED ORDER — SODIUM CHLORIDE 0.9 % IJ SOLN
INTRAMUSCULAR | Status: DC | PRN
  Administered 2015-02-19: 30 mL

## 2015-02-19 MED ORDER — CEFOTETAN DISODIUM-DEXTROSE 2-2.08 GM-% IV SOLR
INTRAVENOUS | Status: AC
  Filled 2015-02-19: qty 50

## 2015-02-19 MED ORDER — ENOXAPARIN SODIUM 40 MG/0.4ML ~~LOC~~ SOLN
40.0000 mg | SUBCUTANEOUS | Status: DC
  Administered 2015-02-20: 40 mg via SUBCUTANEOUS
  Filled 2015-02-19 (×3): qty 0.4

## 2015-02-19 MED ORDER — BUPIVACAINE-EPINEPHRINE 0.25% -1:200000 IJ SOLN
INTRAMUSCULAR | Status: DC | PRN
  Administered 2015-02-19: 20 mL

## 2015-02-19 MED ORDER — EPHEDRINE SULFATE 50 MG/ML IJ SOLN
INTRAMUSCULAR | Status: DC | PRN
  Administered 2015-02-19 (×2): 10 mg via INTRAVENOUS

## 2015-02-19 MED ORDER — FENTANYL CITRATE (PF) 100 MCG/2ML IJ SOLN
25.0000 ug | INTRAMUSCULAR | Status: DC | PRN
  Administered 2015-02-19 (×2): 50 ug via INTRAVENOUS

## 2015-02-19 MED ORDER — SACCHAROMYCES BOULARDII 250 MG PO CAPS
250.0000 mg | ORAL_CAPSULE | Freq: Two times a day (BID) | ORAL | Status: DC
  Administered 2015-02-19 – 2015-02-20 (×3): 250 mg via ORAL
  Filled 2015-02-19 (×5): qty 1

## 2015-02-19 MED ORDER — ENOXAPARIN SODIUM 40 MG/0.4ML ~~LOC~~ SOLN
40.0000 mg | Freq: Once | SUBCUTANEOUS | Status: AC
  Administered 2015-02-19: 40 mg via SUBCUTANEOUS
  Filled 2015-02-19: qty 0.4

## 2015-02-19 MED ORDER — HYDROMORPHONE HCL 1 MG/ML IJ SOLN
INTRAMUSCULAR | Status: DC | PRN
Start: 2015-02-19 — End: 2015-02-19
  Administered 2015-02-19: 1 mg via INTRAVENOUS

## 2015-02-19 MED ORDER — SODIUM CHLORIDE 0.9 % IJ SOLN
INTRAMUSCULAR | Status: AC
  Filled 2015-02-19: qty 10

## 2015-02-19 MED ORDER — BUPIVACAINE-EPINEPHRINE 0.25% -1:200000 IJ SOLN
INTRAMUSCULAR | Status: AC
  Filled 2015-02-19: qty 1

## 2015-02-19 MED ORDER — SODIUM CHLORIDE 0.9 % IJ SOLN
INTRAMUSCULAR | Status: AC
  Filled 2015-02-19: qty 50

## 2015-02-19 MED ORDER — LABETALOL HCL 5 MG/ML IV SOLN
INTRAVENOUS | Status: DC | PRN
  Administered 2015-02-19 (×2): 5 mg via INTRAVENOUS

## 2015-02-19 MED ORDER — TAMSULOSIN HCL 0.4 MG PO CAPS
0.4000 mg | ORAL_CAPSULE | Freq: Every day | ORAL | Status: DC
  Administered 2015-02-20 – 2015-02-24 (×5): 0.4 mg via ORAL
  Filled 2015-02-19 (×5): qty 1

## 2015-02-19 MED ORDER — LACTATED RINGERS IV SOLN
INTRAVENOUS | Status: DC
  Administered 2015-02-19: 1000 mL via INTRAVENOUS

## 2015-02-19 MED ORDER — LACTATED RINGERS IV BOLUS (SEPSIS)
1000.0000 mL | Freq: Three times a day (TID) | INTRAVENOUS | Status: AC | PRN
  Administered 2015-02-20: 1000 mL via INTRAVENOUS
  Filled 2015-02-19: qty 1000

## 2015-02-19 MED ORDER — SODIUM CHLORIDE 0.9 % IV SOLN
INTRAVENOUS | Status: DC
  Administered 2015-02-19 – 2015-02-22 (×3): via INTRAVENOUS

## 2015-02-19 MED ORDER — LACTATED RINGERS IR SOLN
Status: DC | PRN
  Administered 2015-02-19: 1

## 2015-02-19 MED ORDER — SUGAMMADEX SODIUM 200 MG/2ML IV SOLN
INTRAVENOUS | Status: DC | PRN
  Administered 2015-02-19: 150 mg via INTRAVENOUS

## 2015-02-19 MED ORDER — LACTATED RINGERS IV SOLN
INTRAVENOUS | Status: DC | PRN
  Administered 2015-02-19 (×2): via INTRAVENOUS

## 2015-02-19 MED ORDER — HYDROMORPHONE HCL 2 MG/ML IJ SOLN
INTRAMUSCULAR | Status: AC
  Filled 2015-02-19: qty 1

## 2015-02-19 MED ORDER — BUPIVACAINE LIPOSOME 1.3 % IJ SUSP
INTRAMUSCULAR | Status: DC | PRN
  Administered 2015-02-19: 20 mL

## 2015-02-19 MED ORDER — SUGAMMADEX SODIUM 200 MG/2ML IV SOLN
INTRAVENOUS | Status: AC
  Filled 2015-02-19: qty 2

## 2015-02-19 MED ORDER — FENTANYL CITRATE (PF) 100 MCG/2ML IJ SOLN
INTRAMUSCULAR | Status: DC | PRN
Start: 2015-02-19 — End: 2015-02-19
  Administered 2015-02-19: 50 ug via INTRAVENOUS
  Administered 2015-02-19: 100 ug via INTRAVENOUS

## 2015-02-19 MED ORDER — ADULT MULTIVITAMIN W/MINERALS CH
1.0000 | ORAL_TABLET | Freq: Every day | ORAL | Status: DC
  Administered 2015-02-20 – 2015-02-24 (×5): 1 via ORAL
  Filled 2015-02-19 (×5): qty 1

## 2015-02-19 MED ORDER — LIP MEDEX EX OINT
1.0000 "application " | TOPICAL_OINTMENT | Freq: Two times a day (BID) | CUTANEOUS | Status: DC
  Administered 2015-02-19 – 2015-02-20 (×3): 1 via TOPICAL
  Filled 2015-02-19 (×2): qty 7

## 2015-02-19 MED ORDER — ZOLPIDEM TARTRATE 5 MG PO TABS: 5.0000 mg | ORAL_TABLET | Freq: Every evening | ORAL | Status: DC | PRN

## 2015-02-19 MED ORDER — NITROGLYCERIN 0.4 MG SL SUBL: 0.4000 mg | SUBLINGUAL_TABLET | SUBLINGUAL | Status: DC | PRN

## 2015-02-19 MED ORDER — 0.9 % SODIUM CHLORIDE (POUR BTL) OPTIME
TOPICAL | Status: DC | PRN
  Administered 2015-02-19 (×2): 1000 mL

## 2015-02-19 MED ORDER — ALUM & MAG HYDROXIDE-SIMETH 200-200-20 MG/5ML PO SUSP
30.0000 mL | Freq: Four times a day (QID) | ORAL | Status: DC | PRN
  Administered 2015-02-20: 30 mL via ORAL
  Filled 2015-02-19: qty 30

## 2015-02-19 MED ORDER — DIPHENHYDRAMINE HCL 12.5 MG/5ML PO ELIX: 12.5000 mg | ORAL_SOLUTION | Freq: Four times a day (QID) | ORAL | Status: DC | PRN

## 2015-02-19 MED ORDER — EPHEDRINE SULFATE 50 MG/ML IJ SOLN
INTRAMUSCULAR | Status: AC
  Filled 2015-02-19: qty 1

## 2015-02-19 SURGICAL SUPPLY — 105 items
APPLIER CLIP 5 13 M/L LIGAMAX5 (MISCELLANEOUS)
APPLIER CLIP ROT 10 11.4 M/L (STAPLE)
BLADE EXTENDED COATED 6.5IN (ELECTRODE) IMPLANT
BLADE SURG SZ11 CARB STEEL (BLADE) ×3 IMPLANT
CABLE HIGH FREQUENCY MONO STRZ (ELECTRODE) IMPLANT
CANNULA REDUC XI 12-8 STAPL (CANNULA) ×1
CANNULA REDUC XI 12-8MM STAPL (CANNULA) ×1
CANNULA REDUCER 12-8 DVNC XI (CANNULA) ×1 IMPLANT
CATH KIT ON-Q SILVERSOAK 7.5IN (CATHETERS) IMPLANT
CELLS DAT CNTRL 66122 CELL SVR (MISCELLANEOUS) IMPLANT
CHLORAPREP W/TINT 26ML (MISCELLANEOUS) ×3 IMPLANT
CLIP APPLIE 5 13 M/L LIGAMAX5 (MISCELLANEOUS) IMPLANT
CLIP APPLIE ROT 10 11.4 M/L (STAPLE) IMPLANT
CLIP LIGATING HEM O LOK PURPLE (MISCELLANEOUS) ×9 IMPLANT
CLIP LIGATING HEMO O LOK GREEN (MISCELLANEOUS) IMPLANT
COUNTER NEEDLE 20 DBL MAG RED (NEEDLE) ×3 IMPLANT
COVER MAYO STAND STRL (DRAPES) ×6 IMPLANT
COVER SURGICAL LIGHT HANDLE (MISCELLANEOUS) ×3 IMPLANT
COVER TIP SHEARS 8 DVNC (MISCELLANEOUS) ×1 IMPLANT
COVER TIP SHEARS 8MM DA VINCI (MISCELLANEOUS) ×2
DECANTER SPIKE VIAL GLASS SM (MISCELLANEOUS) ×3 IMPLANT
DEVICE TROCAR PUNCTURE CLOSURE (ENDOMECHANICALS) IMPLANT
DRAIN CHANNEL 19F RND (DRAIN) IMPLANT
DRAPE ARM DVNC X/XI (DISPOSABLE) ×4 IMPLANT
DRAPE COLUMN DVNC XI (DISPOSABLE) ×1 IMPLANT
DRAPE DA VINCI XI ARM (DISPOSABLE) ×8
DRAPE DA VINCI XI COLUMN (DISPOSABLE) ×2
DRAPE SURG IRRIG POUCH 19X23 (DRAPES) ×3 IMPLANT
DRAPE WARM FLUID 44X44 (DRAPE) IMPLANT
DRSG OPSITE POSTOP 4X10 (GAUZE/BANDAGES/DRESSINGS) IMPLANT
DRSG OPSITE POSTOP 4X6 (GAUZE/BANDAGES/DRESSINGS) ×3 IMPLANT
DRSG OPSITE POSTOP 4X8 (GAUZE/BANDAGES/DRESSINGS) IMPLANT
DRSG TEGADERM 2-3/8X2-3/4 SM (GAUZE/BANDAGES/DRESSINGS) ×3 IMPLANT
DRSG TEGADERM 4X4.75 (GAUZE/BANDAGES/DRESSINGS) IMPLANT
ELECT PENCIL ROCKER SW 15FT (MISCELLANEOUS) ×6 IMPLANT
ELECT REM PT RETURN 9FT ADLT (ELECTROSURGICAL) ×3
ELECTRODE REM PT RTRN 9FT ADLT (ELECTROSURGICAL) ×1 IMPLANT
ENDOLOOP SUT PDS II  0 18 (SUTURE)
ENDOLOOP SUT PDS II 0 18 (SUTURE) IMPLANT
EVACUATOR SILICONE 100CC (DRAIN) IMPLANT
GAUZE SPONGE 2X2 8PLY STRL LF (GAUZE/BANDAGES/DRESSINGS) ×1 IMPLANT
GAUZE SPONGE 4X4 12PLY STRL (GAUZE/BANDAGES/DRESSINGS) IMPLANT
GLOVE ECLIPSE 8.0 STRL XLNG CF (GLOVE) ×9 IMPLANT
GLOVE INDICATOR 8.0 STRL GRN (GLOVE) ×9 IMPLANT
GOWN STRL REUS W/TWL XL LVL3 (GOWN DISPOSABLE) ×12 IMPLANT
KIT PROCEDURE DA VINCI SI (MISCELLANEOUS) ×2
KIT PROCEDURE DVNC SI (MISCELLANEOUS) ×1 IMPLANT
LEGGING LITHOTOMY PAIR STRL (DRAPES) ×3 IMPLANT
LUBRICANT JELLY K Y 4OZ (MISCELLANEOUS) IMPLANT
NEEDLE INSUFFLATION 14GA 120MM (NEEDLE) ×3 IMPLANT
PACK CARDIOVASCULAR III (CUSTOM PROCEDURE TRAY) ×3 IMPLANT
PACK COLON (CUSTOM PROCEDURE TRAY) ×3 IMPLANT
PEN SKIN MARKING BROAD (MISCELLANEOUS) ×3 IMPLANT
PORT LAP GEL ALEXIS MED 5-9CM (MISCELLANEOUS) IMPLANT
RTRCTR WOUND ALEXIS 18CM MED (MISCELLANEOUS)
SCISSORS LAP 5X35 DISP (ENDOMECHANICALS) IMPLANT
SCRUB PCMX 4 OZ (MISCELLANEOUS) ×3 IMPLANT
SEAL CANN UNIV 5-8 DVNC XI (MISCELLANEOUS) ×3 IMPLANT
SEAL XI 5MM-8MM UNIVERSAL (MISCELLANEOUS) ×6
SEALER VESSEL DA VINCI XI (MISCELLANEOUS)
SEALER VESSEL EXT DVNC XI (MISCELLANEOUS) IMPLANT
SET IRRIG TUBING LAPAROSCOPIC (IRRIGATION / IRRIGATOR) ×3 IMPLANT
SLEEVE XCEL OPT CAN 5 100 (ENDOMECHANICALS) IMPLANT
SOLUTION ELECTROLUBE (MISCELLANEOUS) ×3 IMPLANT
SPONGE GAUZE 2X2 STER 10/PKG (GAUZE/BANDAGES/DRESSINGS) ×2
STAPLER 45 BLU RELOAD XI (STAPLE) ×4 IMPLANT
STAPLER 45 BLUE RELOAD XI (STAPLE) ×8
STAPLER 45 GREEN RELOAD XI (STAPLE)
STAPLER 45 GRN RELOAD XI (STAPLE) IMPLANT
STAPLER CANNULA SEAL DVNC XI (STAPLE) ×1 IMPLANT
STAPLER CANNULA SEAL XI (STAPLE) ×2
STAPLER SHEATH (SHEATH)
STAPLER SHEATH ENDOWRIST DVNC (SHEATH) IMPLANT
SUT MNCRL AB 4-0 PS2 18 (SUTURE) ×3 IMPLANT
SUT PDS AB 1 CTX 36 (SUTURE) ×6 IMPLANT
SUT PDS AB 1 TP1 96 (SUTURE) IMPLANT
SUT PDS AB 2-0 CT2 27 (SUTURE) IMPLANT
SUT PROLENE 0 CT 2 (SUTURE) ×3 IMPLANT
SUT PROLENE 2 0 SH DA (SUTURE) IMPLANT
SUT SILK 2 0 (SUTURE) ×2
SUT SILK 2 0 SH CR/8 (SUTURE) ×3 IMPLANT
SUT SILK 2-0 18XBRD TIE 12 (SUTURE) ×1 IMPLANT
SUT SILK 3 0 (SUTURE) ×2
SUT SILK 3 0 SH CR/8 (SUTURE) ×3 IMPLANT
SUT SILK 3-0 18XBRD TIE 12 (SUTURE) ×1 IMPLANT
SUT V-LOC BARB 180 2/0GR6 GS22 (SUTURE)
SUT VIC AB 3-0 SH 18 (SUTURE) IMPLANT
SUT VIC AB 3-0 SH 27 (SUTURE)
SUT VIC AB 3-0 SH 27XBRD (SUTURE) IMPLANT
SUT VICRYL 0 UR6 27IN ABS (SUTURE) ×3 IMPLANT
SUT VLOC 180 2-0 9IN GS21 (SUTURE) IMPLANT
SUTURE V-LC BRB 180 2/0GR6GS22 (SUTURE) IMPLANT
SYRINGE 10CC LL (SYRINGE) ×3 IMPLANT
SYS LAPSCP GELPORT 120MM (MISCELLANEOUS)
SYSTEM LAPSCP GELPORT 120MM (MISCELLANEOUS) IMPLANT
TAPE UMBILICAL COTTON 1/8X30 (MISCELLANEOUS) ×3 IMPLANT
TOWEL OR 17X26 10 PK STRL BLUE (TOWEL DISPOSABLE) ×3 IMPLANT
TOWEL OR NON WOVEN STRL DISP B (DISPOSABLE) IMPLANT
TRAY FOLEY W/METER SILVER 14FR (SET/KITS/TRAYS/PACK) ×3 IMPLANT
TRAY FOLEY W/METER SILVER 16FR (SET/KITS/TRAYS/PACK) ×3 IMPLANT
TROCAR BLADELESS OPT 5 100 (ENDOMECHANICALS) ×3 IMPLANT
TUBING CONNECTING 10 (TUBING) IMPLANT
TUBING CONNECTING 10' (TUBING)
TUBING FILTER THERMOFLATOR (ELECTROSURGICAL) ×3 IMPLANT
TUNNELER SHEATH ON-Q 16GX12 DP (PAIN MANAGEMENT) IMPLANT

## 2015-02-19 NOTE — Interval H&P Note (Signed)
History and Physical Interval Note:  02/19/2015 11:07 AM  Michael Blanchard  has presented today for surgery, with the diagnosis of Cecal Cancer  The various methods of treatment have been discussed with the patient and family. After consideration of risks, benefits and other options for treatment, the patient has consented to  Procedure(s): XI ROBOT ASSISTED PROXIMAL RIGHT COLECTOMY (Right) as a surgical intervention .  The patient's history has been reviewed, patient examined, no change in status, stable for surgery.  I have reviewed the patient's chart and labs.  Questions were answered to the patient's satisfaction.     [name]

## 2015-02-19 NOTE — Anesthesia Preprocedure Evaluation (Signed)
Anesthesia Evaluation  Patient identified by MRN, date of birth, ID band Patient awake    Reviewed: Allergy & Precautions, H&P , NPO status , Patient's Chart, lab work & pertinent test results  History of Anesthesia Complications Negative for: history of anesthetic complications  Airway Mallampati: III  TM Distance: >3 FB Neck ROM: full    Dental  (+) Edentulous Upper, Edentulous Lower   Pulmonary neg pulmonary ROS, former smoker,    Pulmonary exam normal breath sounds clear to auscultation       Cardiovascular Exercise Tolerance: Poor hypertension, Pt. on medications + CAD and + CABG  Normal cardiovascular exam Rhythm:regular Rate:Normal     Neuro/Psych  Neuromuscular disease    GI/Hepatic negative GI ROS, Neg liver ROS,   Endo/Other  negative endocrine ROS  Renal/GU CRFRenal disease     Musculoskeletal  (+) Arthritis ,   Abdominal   Peds  Hematology  (+) anemia ,   Anesthesia Other Findings   Reproductive/Obstetrics negative OB ROS                             Anesthesia Physical  Anesthesia Plan  ASA: III  Anesthesia Plan: General   Post-op Pain Management:    Induction: Intravenous  Airway Management Planned: Oral ETT  Additional Equipment:   Intra-op Plan:   Post-operative Plan: Extubation in OR and Possible Post-op intubation/ventilation  Informed Consent: I have reviewed the patients History and Physical, chart, labs and discussed the procedure including the risks, benefits and alternatives for the proposed anesthesia with the patient or authorized representative who has indicated his/her understanding and acceptance.   Dental Advisory Given  Plan Discussed with: Anesthesiologist, CRNA and Surgeon  Anesthesia Plan Comments:         Anesthesia Quick Evaluation

## 2015-02-19 NOTE — Op Note (Signed)
02/19/2015  3:28 PM  PATIENT:  Michael Blanchard  81 y.o. male  Patient Care Team: Seward Carol, MD as PCP - General (Internal Medicine) Darlin Coco, MD as Consulting Physician (Cardiology) Clarene Essex, MD as Consulting Physician (Gastroenterology) Michael Boston, MD as Consulting Physician (General Surgery)  PRE-OPERATIVE DIAGNOSIS:  Cecal Cancer  POST-OPERATIVE DIAGNOSIS:    Cecal cancer Left indirect inguinal hernia  PROCEDURE:  Procedure(s): XI ROBOT ASSISTED PROXIMAL RIGHT COLECTOMY  Surgeon(s): Michael Boston, MD Leighton Ruff, MD - [place], RNFA - Assist  ANESTHESIA:   local and general  EBL:  Total I/O In: 1000 [I.V.:1000] Out: 250 [Urine:150; Blood:100]  Delay start of Pharmacological VTE agent (>24hrs) due to surgical blood loss or risk of bleeding:  no  DRAINS: none   SPECIMEN:  Proximal "right" colon  DISPOSITION OF SPECIMEN:  PATHOLOGY  COUNTS:  YES  PLAN OF CARE: Admit to inpatient   PATIENT DISPOSITION:  PACU - hemodynamically stable.  INDICATION:    Pleasant elderly gentleman chronically anticoagulated for mechanical heart valve.  Found to be symptomatically anemic with significant bleeding.  Colonoscopy found a bulky tumor at the cecum.  He was cleared by medicine.  I recommended segmental resection:  The anatomy & physiology of the digestive tract was discussed.  The pathophysiology was discussed.  Natural history risks without surgery was discussed.   I worked to give an overview of the disease and the frequent need to have multispecialty involvement.  I feel the risks of no intervention will lead to serious problems that outweigh the operative risks; therefore, I recommended a partial colectomy to remove the pathology.  Laparoscopic & open techniques were discussed.   Risks such as bleeding, infection, abscess, leak, reoperation, possible ostomy, hernia, heart attack, death, and other risks were discussed.  I noted a good likelihood this  will help address the problem.   Goals of post-operative recovery were discussed as well.  We will work to minimize complications.  An educational handout on the pathology was given as well.  Questions were answered.    The patient expresses understanding & wishes to proceed with surgery.  OR FINDINGS:   Patient had very bulky tumor at the ileocecal region with some mesenteric adhesion to the right kidney drove his fascia and fat as well as the right gonadal vein.  No obvious metastatic disease on visceral parietal peritoneum or liver.  Patient is small incidental left indirect inguinal hernia with an epiploic appendage within it.  Easily reducible.  Small.  Left alone.  It is an ileocolonic anastomosis that rests in the supraumbilical region.  DESCRIPTION:   Informed consent was confirmed.  The patient underwent general anaesthesia without difficulty.  The patient was positioned with arms tucked & secured appropriately.  VTE prevention in place.  The patient's abdomen was clipped, prepped, & draped in a sterile fashion.  Surgical timeout confirmed our plan.  The patient was positioned in reverse Trendelenburg.  Abdominal entry was gained using Veress needle technique using trach hook on the anterior abdominal wall fascia for countertension in the left upper abdomen.  Entry was clean.  I induced carbon dioxide insufflation.  Camera inspection revealed no injury.  Extra ports were carefully placed under direct laparoscopic visualization.  We docked the Inituitive Vinci robot carefully and placed intstruments under visualization  I mobilized & reflected the greater omentum and small bowel in the upper abdomen.  I was able to elevate the proximal colon to isolate the ileocolonic pedicle.  I  scored the ileal mesentery just proximal to that.   I carried that further dissection in a medial to lateral fashion.  I was able to bluntly get into the retro-mesenteric plane on the right side.  I freed the  proximal right sided colonic mesentery off the retroperitoneum including the duodenal sweep & pancreatic head. I was able to get underneath the hepatic flexure.  I was able to get underneath the proximal and mid transverse colon.  I isolated the proximal ileocecal pedicle.  I skeletonized it & transected the vessels.  I used plastic hemoclips on the patient's side and transected on the specimen side with vessel seal energy on the ileocolic artery and ileocolic vein.  I also transected the right colic artery coming off the right branch of the middle colic to preserve the right middle colic artery intact.   I then proceeded to mobilize the terminal ileum & proximal "right" colon in a lateral to medial fashion.  I mobilized the distal ileal mesentery off its retroperitoneal and pelvic attachments.  This is rather inflamed bulky and thickened I ended up having to isolate and transect the right gonadal vein en bloc with the mass.  It easily identify the right ureter and stayed superficial to that.  Of to transect part of drove his fascia and perinephric fat on the inferior pole of the kidney there was no good plane for an en bloc resection.  I mobilized the ascending colon off It is side wall attachments to the paracolic gutter and retroperitoneum.   I also mobilized the greater omentum off the mid transverse colon and mobilized the mid to proximal transverse colon in a superior to inferior fashion.  This allowed me to mobilize the hepatic flexure and get a complete mobilization of the proximal "right" colon to the mid-transverse colon..  I could isolate the pathology. When he went ahead and proceeded with transection.  I transected in the distal ileum with a robotic stapler.  Then transected at the proximal mid transverse colon with a robotic stapler.  We took the intervening mesentery using Vessel sealer energy.  We assured hemostasis.   I did a side-to-side stapled anastomosis of ileum to mid-transverse colon  using a 84mm robotic stapler x2 loads.  I sewed the common staple channel wound with an absorbable suture ( 2-0 V-lock) in a running [place] fashion from each corner & meeting in the middle.   I protected the anastomosis with an omentopexy of greater omentum.  We did reinspection of the abdomen.  Hemostasis was good.   Ureters and bowel uninjured.  The anastomosis looked healthy.   We did a final irrigation of antibiotic solution (900 mg clindamycin/240 mg gentamicin in a liter of crystalloid) & held that while we placed the wound protector through a 29mm stapler incision.  Specimen removed without incident.   Ports and wound protector removed.  The patient was re-draped.  Sterile unused instruments were used from this point out per colon SSI prevention protocol.  Fascia at the extraction site closed with PDS suture.   Skin closed using Monocryl stitch and sterile dressing.  I closed the skin with some interrupted Monocryl stitches. I placed wicks in between those areas. I placed sterile dressing.   Patient is being extubated go to recovery room. I discussed postop care with the patient in detail the office & in the holding area. Instructions are written.  I updated the patient's status to the family.  Recommendations were made.  Questions were answered.  The family expressed understanding & appreciation.  [name], [name], F.A.C.S. Gastrointestinal and Minimally Invasive Surgery [facility] [address], [place], [place] [address] [phone] Main / Paging

## 2015-02-19 NOTE — Transfer of Care (Signed)
Immediate Anesthesia Transfer of Care Note  Patient: Michael Blanchard  Procedure(s) Performed: Procedure(s): XI ROBOT ASSISTED PROXIMAL RIGHT COLECTOMY (Right)  Patient Location: PACU  Anesthesia Type:General  Level of Consciousness:  sedated, patient cooperative and responds to stimulation  Airway & Oxygen Therapy:Patient Spontanous Breathing and Patient connected to face mask oxgen  Post-op Assessment:  Report given to PACU RN and Post -op Vital signs reviewed and stable  Post vital signs:  Reviewed and stable  Last Vitals:  Filed Vitals:   02/19/15 1006  BP: 159/49  Pulse: 57  Temp: 36.7 C  Resp: 18    Complications: No apparent anesthesia complications

## 2015-02-19 NOTE — H&P (View-Only) (Signed)
[facility] 01/14/2015 11:30 AM Location: [facility] Patient #: [id number] DOB: 1925-03-28 Married / Language: English / Race: White Male   History of Present Illness [name] MD; 01/14/2015 12:43 PM) The patient is a 81 year old male who presents with colorectal cancer. Patient sent by Dr. Shanon Brow Tat with [facility] hospitalist for diagnosis of cecal cancer causing bleeding.  Pleasant elderly gentleman. Has significant osteoarthritis and chronic pain on chronic narcotics. Trigeminal neuralgia as well. Takes care of his wife who is blind and wheelchair ridden. As one daughter that helps almost every day. Other family members traveling from other parts of the country. Patient history coronary disease status bypass graft as well as valve replacement on chronic Plavix anticoagulation. That was in 2001. Noted worsening dyspnea over the past few months. Found to be severely anemic in the cardiology office. Admitted. Transfuse. CT scan showing some thickening in the ileocecal region. Endoscopy by Dr Watt Climes confirmed a mass. Biopsy consistent with adenocarcinoma. Surgical consultation requested. Transfuse. Stabilized. Patient comes today with family to discuss if surgery is needed.  Patient walks with a cane. He has knee braces. He claims he somewhat independent but family has concerns. Normally has a bowel movement about every day with the help of a lot of stool softeners since he is on chronic narcotics. He's never had abdominal surgery. A heart bypass surgery no other operations. No difficulty with swallowing or dysphagia. I don't believe he was sent home on iron. He is back to taking his Plavix for now.   Other Problems Elbert Ewings, CMA; 01/14/2015 11:30 AM) Arthritis Cerebrovascular Accident Colon Cancer High blood pressure Hypercholesterolemia  Past Surgical History Elbert Ewings, CMA; 01/14/2015 11:30 AM) Coronary Artery Bypass  Graft Tonsillectomy Valve Replacement  Diagnostic Studies History Elbert Ewings, CMA; 01/14/2015 11:30 AM) Colonoscopy within last year  Allergies Elbert Ewings, CMA; 01/14/2015 11:44 AM) Gabapentin *CHEMICALS* Lipitor *ANTIHYPERLIPIDEMICS* Niaspan *ANTIHYPERLIPIDEMICS*  Medication History Elbert Ewings, CMA; 01/14/2015 11:47 AM) AmLODIPine Besylate (2.5MG  Tablet, Oral) Active. CarBAMazepine (200MG  Tablet, Oral) Active. Hydrocodone-Acetaminophen (7.5-325MG  Tablet, Oral) Active. Clopidogrel Bisulfate (75MG  Tablet, Oral) Active. Donepezil HCl (10MG  Tablet, Oral) Active. Diclofenac Epolamine (1.3% Patch, Transdermal) Active. Losartan Potassium-HCTZ (100-25MG  Tablet, Oral) Active. Metoprolol Tartrate (50MG  Tablet, Oral) Active. Nitroglycerin (0.4MG  Tab Sublingual, Sublingual) Active. Tamsulosin HCl (0.4MG  Capsule, Oral) Active. Rosuvastatin Calcium (10MG  Tablet, Oral) Active. Pregabalin (50MG  Capsule, Oral) Active. Multiple Vitamin (Oral) Active. Calcium 600 (1500 (600 Ca)MG Tablet, Oral) Active. Osteo Bi-Flex Adv Joint Shield (Oral) Active. Medications Reconciled  Social History Elbert Ewings, [place]; 01/14/2015 11:30 AM) Caffeine use Carbonated beverages, Coffee. Tobacco use Former smoker.  Family History Elbert Ewings, [place]; 01/14/2015 11:30 AM) Alcohol Abuse Brother, Father, Mother, Sister, Son. Arthritis Daughter. Colon Polyps Daughter, Son. Diabetes Mellitus Daughter. Heart disease in male family member before age 38 Hypertension Son. Melanoma Daughter. Migraine Headache Daughter. Thyroid problems Daughter.    Review of Systems Elbert Ewings CMA; 01/14/2015 11:30 AM) General Present- Fatigue and Weight Loss. Not Present- Appetite Loss, Chills, Fever, Night Sweats and Weight Gain. Skin Present- Dryness. Not Present- Change in Wart/Mole, Hives, Jaundice, New Lesions, Non-Healing Wounds, Rash and Ulcer. HEENT Present- Hearing Loss, Seasonal Allergies  and Wears glasses/contact lenses. Not Present- Earache, Hoarseness, Nose Bleed, Oral Ulcers, Ringing in the Ears, Sinus Pain, Sore Throat, Visual Disturbances and Yellow Eyes. Respiratory Present- Difficulty Breathing and Snoring. Not Present- Bloody sputum, Chronic Cough and Wheezing. Breast Not Present- Breast Mass, Breast Pain, Nipple Discharge and Skin Changes. Cardiovascular Present- Shortness of Breath. Not  Present- Chest Pain, Difficulty Breathing Lying Down, Leg Cramps, Palpitations, Rapid Heart Rate and Swelling of Extremities. Gastrointestinal Present- Bloody Stool and Constipation. Not Present- Abdominal Pain, Bloating, Change in Bowel Habits, Chronic diarrhea, Difficulty Swallowing, Excessive gas, Gets full quickly at meals, Hemorrhoids, Indigestion, Nausea, Rectal Pain and Vomiting. Male Genitourinary Present- Change in Urinary Stream, Frequency, Impotence, Nocturia, Urgency and Urine Leakage. Not Present- Blood in Urine and Painful Urination. Musculoskeletal Present- Joint Pain and Muscle Weakness. Not Present- Back Pain, Joint Stiffness, Muscle Pain and Swelling of Extremities. Neurological Present- Decreased Memory, Trouble walking and Weakness. Not Present- Fainting, Headaches, Numbness, Seizures, Tingling and Tremor. Hematology Present- Easy Bruising. Not Present- Excessive bleeding, Gland problems, HIV and Persistent Infections.  Vitals Elbert Ewings CMA; 01/14/2015 11:43 AM) 01/14/2015 11:33 AM Height: 68in Weight Measurement Declined Temp.: 97.4F(Temporal)  Pulse: 60 (Regular)  Resp.: 16 (Unlabored)  BP: 130/80 (Sitting, Left Arm, Standard)       Physical Exam [name] MD; 01/14/2015 12:34 PM) General Mental Status-Alert. General Appearance-Not in acute distress, Not Sickly. Orientation-Oriented X3. Hydration-Well hydrated. Voice-Normal.  Integumentary Global Assessment Upon inspection and palpation of skin surfaces of the - Axillae:  non-tender, no inflammation or ulceration, no drainage. and Distribution of scalp and body hair is normal. General Characteristics Temperature - normal warmth is noted.  Head and Neck Head-normocephalic, atraumatic with no lesions or palpable masses. Face Global Assessment - atraumatic, no absence of expression. Neck Global Assessment - no abnormal movements, no bruit auscultated on the right, no bruit auscultated on the left, no decreased range of motion, non-tender. Trachea-midline. Thyroid Gland Characteristics - non-tender.  Eye Eyeball - Left-Extraocular movements intact, No Nystagmus. Eyeball - Right-Extraocular movements intact, No Nystagmus. Cornea - Left-No Hazy. Cornea - Right-No Hazy. Sclera/Conjunctiva - Left-No scleral icterus, No Discharge. Sclera/Conjunctiva - Right-No scleral icterus, No Discharge. Pupil - Left-Direct reaction to light normal. Pupil - Right-Direct reaction to light normal. Note: Wears glasses   ENMT Ears Pinna - Left - no drainage observed, no generalized tenderness observed. Right - no drainage observed, no generalized tenderness observed. Nose and Sinuses External Inspection of the Nose - no destructive lesion observed. Inspection of the nares - Left - quiet respiration. Right - quiet respiration. Mouth and Throat Lips - Upper Lip - no fissures observed, no pallor noted. Lower Lip - no fissures observed, no pallor noted. Nasopharynx - no discharge present. Oral Cavity/Oropharynx - Tongue - no dryness observed. Oral Mucosa - no cyanosis observed. Hypopharynx - no evidence of airway distress observed. Note: Not hard of hearing   Chest and Lung Exam Inspection Movements - Normal and Symmetrical. Accessory muscles - No use of accessory muscles in breathing. Palpation Palpation of the chest reveals - Non-tender. Auscultation Breath sounds - Normal and Clear.  Cardiovascular Auscultation Rhythm - Regular. Murmurs & Other  Heart Sounds - Auscultation of the heart reveals - No Murmurs and No Systolic Clicks.  Abdomen Inspection Inspection of the abdomen reveals - No Visible peristalsis and No Abnormal pulsations. Umbilicus - No Bleeding, No Urine drainage. Palpation/Percussion Palpation and Percussion of the abdomen reveal - Soft, Non Tender, No Rebound tenderness, No Rigidity (guarding) and No Cutaneous hyperesthesia. Note: Flat. Mild fullness consider with liver edge and RIGHT upper quadrant but no discrete mass. No diastases. No umbilical hernia.   Male Genitourinary Sexual Maturity Tanner 5 - Adult hair pattern and Adult penile size and shape. Note: Normal external genitalia. No inguinal hernias.   Peripheral Vascular Upper Extremity Inspection - Left -  No Cyanotic nailbeds, Not Ischemic. Right - No Cyanotic nailbeds, Not Ischemic.  Neurologic Neurologic evaluation reveals -normal attention span and ability to concentrate, able to name objects and repeat phrases. Appropriate fund of knowledge , normal sensation and normal coordination. Mental Status Affect - not angry, not paranoid. Cranial Nerves-Normal Bilaterally. Gait-Normal.  Neuropsychiatric Mental status exam performed with findings of-able to articulate well with normal speech/language, rate, volume and coherence, thought content normal with ability to perform basic computations and apply abstract reasoning and no evidence of hallucinations, delusions, obsessions or homicidal/suicidal ideation.  Musculoskeletal Global Assessment Spine, Ribs and Pelvis - no instability, subluxation or laxity. Right Upper Extremity - no instability, subluxation or laxity. Note: Wears bilateral knee braces. No significant edema. Moderate stiffness and RIGHT shoulder consistent with osteoarthritis   Lymphatic Head & Neck  General Head & Neck Lymphatics: Bilateral - Description - No Localized lymphadenopathy. Axillary  General Axillary Region:  Bilateral - Description - No Localized lymphadenopathy. Femoral & Inguinal  Generalized Femoral & Inguinal Lymphatics: Left - Description - No Localized lymphadenopathy. Right - Description - No Localized lymphadenopathy.    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 Biopsy consistent with adenocarcinoma. Significant anemia on Plavix improved after transfusion.  Standard of care would be segmental colonic resection. This is hopefully an early stage since metastatic workup has ruled out obvious stage IV disease. CT scans of chest abdomen and pelvis and CEA otherwise underwhelming.  He is of advanced age and some medical complexity, but he does have a decent quality of life. I do not think we are in a situation of hospice. He wishes to be aggressive. I think that is reasonable as long as cardiology feels it is okay. Family mostly on board but understandably concerned of risks. I worry the risk of more serious problems without surgery is imminent since he's already had symptomatic anemia. Risks of surgical intervention less if we can do this in an elective setting. Echo did show good ejection fraction. While his pig valve is 79 years old, it seems to be functioning okay with mild regurgitation. More concerned about the mitral valve stenosis but long as Dr. Mare Ferrari does not have a concern, would proceed with surgery.  Reasonable to do a minimally invasive approach to decrease pain and help speed recovery and minimize ileus. Robotic intracorporeal if possible.  His risks of surgery are increased with his cardiac history advanced age, but the patient wishes to be aggressive as well. I had a very long discussion with the patient, his wife, his children. They're guardedly hopeful and wish to proceed. We'll get cardiac clearance first. Current Plans You are being scheduled for surgery - Our schedulers will call you.  You should  hear from our office's scheduling department within 5 working days about the location, date, and time of surgery. We try to make accommodations for patient's preferences in scheduling surgery, but sometimes the OR schedule or the surgeon's schedule prevents [place] from making those accommodations.  If you have not heard from our office [phone] in 5 working days, call the office and ask for your surgeon's nurse.  If you have other questions about your diagnosis, plan, or surgery, call the office and ask for your surgeon's nurse.  Written instructions provided I recommended obtaining preoperative cardiac clearance. I am concerned about the health of the patient and the ability to tolerate the operation. Therefore, we will request clearance by cardiology to  better assess operative risk & see if a reevaluation, further workup, etc is needed. Also recommendations on how medications such as for anticoagulation and blood pressure should be managed/held/restarted after surgery. The anatomy & physiology of the digestive tract was discussed. The pathophysiology of the colon was discussed. Natural history risks without surgery was discussed. I feel the risks of no intervention will lead to serious problems that outweigh the operative risks; therefore, I recommended a partial colectomy to remove the pathology. Minimally invasive (Robotic/Laparoscopic) & open techniques were discussed.  Risks such as bleeding, infection, abscess, leak, reoperation, possible ostomy, hernia, heart attack, death, and other risks were discussed. I noted a good likelihood this will help address the problem. Goals of post-operative recovery were discussed as well. Need for adequate nutrition, daily bowel regimen and healthy physical activity, to optimize recovery was noted as well. We will work to minimize complications. Educational materials were available as well. Questions were answered. The patient expresses understanding & wishes to  proceed with surgery.  Pt Education - [facility] Colon Bowel Prep 2015 Miralax/Antibiotics Started Neomycin Sulfate 500MG , 2 (two) Tablet SEE NOTE, #6, 01/14/2015, No Refill. Local Order: TAKE TWO TABLETS AT 2 PM, 3 PM, AND 10 PM THE DAY PRIOR TO SURGERY Started Flagyl 500MG , 2 (two) Tablet SEE NOTE, #6, 01/14/2015, No Refill. Local Order: Take at 2pm, 3pm, and 10pm the day prior to your colon operation Pt Education - [facility] Colectomy post-op instructions: discussed with patient and provided information. Pt Education - Pamphlet Given - Laparoscopic Colorectal Surgery: discussed with patient and provided information. Pt Education - [facility] Colorectal Cancer (AT): discussed with patient and provided information. Consider follow up colonoscopy by your gastroenterologist, depending on your diagnosis. Call your gastroenterologist for advice  If you had a colon or rectal cancer resected by surgery, you should strongly consider getting a colonoscopy by your gastroenterologict one year after the colon cancer was removed. If it was a benign polyp that was removed by colon resection, consider follow-up colonoscopy in about 3 years.  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 Hopefully a slow ooze.  We'll write for some iron make sure his cardiologist agrees.  Would keep on Plavix for now until time of surgery unless he has another bleedingevent or has a further drop in his hemoglobin.Marland Kitchen Hopefully will be surgery within the next few weeks if his cardiologist agrees Current Plans Started Iron 325 (65 Fe)MG, 1 (one) Tablet three times daily, #60, 01/14/2015, Ref. x2.  [name], [name], F.A.C.S. Gastrointestinal and Minimally Invasive Surgery [facility] [address] [place], [place] [address] [phone] Main / Paging

## 2015-02-19 NOTE — Discharge Instructions (Signed)
SURGERY: POST OP INSTRUCTIONS °(Surgery for small bowel obstruction, colon resection, etc)  ° ° °DIET °Follow a light diet the first few days at home.  Start with a bland diet such as soups, liquids, starchy foods, low fat foods, etc.  If you feel full, bloated, or constipated, stay on a ful liquid or pureed/blenderized diet for a few days until you feel better and no longer constipated. °Be sure to drink plenty of fluids every day to avoid getting dehydrated (feeling dizzy, not urinating, etc.). °Gradually add a fiber supplement to your diet over the next week.  Gradually get back to a regular solid diet.  Avoid fast food or heavy meals the first week as you are more likely to get nauseated. °It is expected for your digestive tract to need a few months to get back to normal.  It is common for your bowel movements and stools to be irregular.  You will have occasional bloating and cramping that should eventually fade away.  Until you are eating solid food normally, off all pain medications, and back to regular activities; your bowels will not be normal. °Focus on eating a low-fat, high fiber diet the rest of your life (See Getting to Good Bowel Health, below). ° °CARE of your INCISION or WOUND °It is good for closed incision and even open wounds to be washed every day.  Shower every day.  Short baths are fine.  Wash the incisions and wounds clean with soap & water.    °If you have a closed incision(s), wash the incision with soap & water every day.  You may leave closed incisions open to air if it is dry.   You may cover the incision with clean gauze & replace it after your daily shower for comfort. °If you have skin tapes (Steristrips) or skin glue (Dermabond) on your incision, leave them in place.  They will fall off on their own like a scab.  You may trim any edges that curl up with clean scissors.  If you have staples, set up an appointment for them to be removed in the office in 10 days after surgery.  °If you  have a drain, wash around the skin exit site with soap & water and place a new dressing of gauze or band aid around the skin every day.  Keep the drain site clean & dry.    °If you have an open wound with packing, see wound care instructions.  In general, it is encouraged that you remove your dressing and packing, shower with soap & water, and replace your dressing once a day.  Pack the wound with clean gauze moistened with normal (0.9%) saline to keep the wound moist & uninfected.  Pressure on the dressing for 30 minutes will stop most wound bleeding.  Eventually your body will heal & pull the open wound closed over the next few months.  °Raw open wounds will occasionally bleed or secrete yellow drainage until it heals closed.  Drain sites will drain a little until the drain is removed.  Even closed incisions can have mild bleeding or drainage the first few days until the skin edges scab over & seal.   °If you have an open wound with a wound vac, see wound vac care instructions. ° ° ° ° °ACTIVITIES as tolerated °Start light daily activities --- self-care, walking, climbing stairs-- beginning the day after surgery.  Gradually increase activities as tolerated.  Control your pain to be active.  Stop when you   are tired.  Ideally, walk several times a day, eventually an hour a day.   °Most people are back to most day-to-day activities in a few weeks.  It takes 4-8 weeks to get back to unrestricted, intense activity. °If you can walk 30 minutes without difficulty, it is safe to try more intense activity such as jogging, treadmill, bicycling, low-impact aerobics, swimming, etc. °Save the most intensive and strenuous activity for last (Usually 4-8 weeks after surgery) such as sit-ups, heavy lifting, contact sports, etc.  Refrain from any intense heavy lifting or straining until you are off narcotics for pain control.  You will have off days, but things should improve week-by-week. °DO NOT PUSH THROUGH PAIN.  Let pain be  your guide: If it hurts to do something, don't do it.  Pain is your body warning you to avoid that activity for another week until the pain goes down. °You may drive when you are no longer taking narcotic prescription pain medication, you can comfortably wear a seatbelt, and you can safely make sudden turns/stops to protect yourself without hesitating due to pain. °You may have sexual intercourse when it is comfortable. If it hurts to do something, stop. ° °MEDICATIONS °Take your usually prescribed home medications unless otherwise directed.   °Blood thinners:  °Usually you can restart any strong blood thinners after the second postoperative day.  It is OK to take aspirin right away.    ° If you are on strong blood thinners (warfarin/Coumadin, Plavix, Xerelto, Eliquis, Pradaxa, etc), discuss with your surgeon, medicine PCP, and/or cardiologist for instructions on when to restart the blood thinner & if blood monitoring is needed (PT/INR blood check, etc).   ° ° °PAIN CONTROL °Pain after surgery or related to activity is often due to strain/injury to muscle, tendon, nerves and/or incisions.  This pain is usually short-term and will improve in a few months.  °To help speed the process of healing and to get back to regular activity more quickly, DO THE FOLLOWING THINGS TOGETHER: °1. Increase activity gradually.  DO NOT PUSH THROUGH PAIN °2. Use Ice and/or Heat °3. Try Gentle Massage and/or Stretching °4. Take over the counter pain medication °5. Take Narcotic prescription pain medication for more severe pain ° °Good pain control = faster recovery.  It is better to take more medicine to be more active than to stay in bed all day to avoid medications. °1.  Increase activity gradually °Avoid heavy lifting at first, then increase to lifting as tolerated over the next 6 weeks. °Do not “push through” the pain.  Listen to your body and avoid positions and maneuvers than reproduce the pain.  Wait a few days before trying  something more intense °Walking an hour a day is encouraged to help your body recover faster and more safely.  Start slowly and stop when getting sore.  If you can walk 30 minutes without stopping or pain, you can try more intense activity (running, jogging, aerobics, cycling, swimming, treadmill, sex, sports, weightlifting, etc.) °Remember: If it hurts to do it, then don’t do it! °2. Use Ice and/or Heat °You will have swelling and bruising around the incisions.  This will take several weeks to resolve. °Ice packs or heating pads (6-8 times a day, 30-60 minutes at a time) will help sooth soreness & bruising. °Some people prefer to use ice alone, heat alone, or alternate between ice & heat.  Experiment and see what works best for you.  Consider trying ice for the first   few days to help decrease swelling and bruising; then, switch to heat to help relax sore spots and speed recovery. °Shower every day.  Short baths are fine.  It feels good!  Keep the incisions and wounds clean with soap & water.   °3. Try Gentle Massage and/or Stretching °Massage at the area of pain many times a day °Stop if you feel pain - do not overdo it °4. Take over the counter pain medication °This helps the muscle and nerve tissues become less irritable and calm down faster °Choose ONE of the following over-the-counter anti-inflammatory medications: °Acetaminophen 500mg tabs (Tylenol) 1-2 pills with every meal and just before bedtime (avoid if you have liver problems or if you have acetaminophen in you narcotic prescription) °Naproxen 220mg tabs (ex. Aleve, Naprosyn) 1-2 pills twice a day (avoid if you have kidney, stomach, IBD, or bleeding problems) °Ibuprofen 200mg tabs (ex. Advil, Motrin) 3-4 pills with every meal and just before bedtime (avoid if you have kidney, stomach, IBD, or bleeding problems) °Take with food/snack several times a day as directed for at least 2 weeks to help keep pain / soreness down & more manageable. °5. Take Narcotic  prescription pain medication for more severe pain °A prescription for strong pain control is often given to you upon discharge (for example: oxycodone/Percocet, hydrocodone/Norco/Vicodin, or tramadol/Ultram) °Take your pain medication as prescribed. °Be mindful that most narcotic prescriptions contain Tylenol (acetaminophen) as well - avoid taking too much Tylenol. °If you are having problems/concerns with the prescription medicine (does not control pain, nausea, vomiting, rash, itching, etc.), please call us [phone] to see if we need to switch you to a different pain medicine that will work better for you and/or control your side effects better. °If you need a refill on your pain medication, you must call the office before 4 pm and on weekdays only.  By federal law, prescriptions for narcotics cannot be called into a pharmacy.  They must be filled out on paper & picked up from our office by the patient or authorized caretaker.  Prescriptions cannot be filled after 4 pm nor on weekends.   °WHEN TO CALL US [phone] °Severe uncontrolled or worsening pain  °Fever over 101 F (38.5 C) °Concerns with the incision: Worsening pain, redness, rash/hives, swelling, bleeding, or drainage °Reactions / problems with new medications (itching, rash, hives, nausea, etc.) °Nausea and/or vomiting °Difficulty urinating °Difficulty breathing °Worsening fatigue, dizziness, lightheadedness, blurred vision °Other concerns °If you are not getting better after two weeks or are noticing you are getting worse, contact our office [phone] for further advice.  We may need to adjust your medications, re-evaluate you in the office, send you to the emergency room, or see what other things we can do to help. °The clinic staff is available to answer your questions during regular business hours (8:30am-5pm).  Please don’t hesitate to call and ask to speak to one of our nurses for clinical concerns.    °A surgeon from [facility] is always on call at the hospitals 24 hours/day °If you have a medical emergency, go to the nearest emergency room or call 911. °FOLLOW UP in our office °One the day of your discharge from the hospital (or the next business weekday), please call [facility] to set up or confirm an appointment to see your surgeon in the office for a follow-up appointment.  Usually it is 2-3 weeks after your surgery.   °If you have skin staples at   your incision(s), let the office know so we can set up a time in the office for the nurse to remove them (usually around 10 days after surgery). °Make sure that you call for appointments the day of discharge (or the next business weekday) from the hospital to ensure a convenient appointment time. °IF YOU HAVE DISABILITY OR FAMILY LEAVE FORMS, BRING THEM TO THE OFFICE FOR PROCESSING.  DO NOT GIVE THEM TO YOUR DOCTOR. ° °[facility] °[address], [address], [place], [place]  [address] ? °[phone] - Main °[phone] - Toll Free,  [phone] - Fax °[email or web address] ° °GETTING TO GOOD BOWEL HEALTH. °It is expected for your digestive tract to need a few months to get back to normal.  It is common for your bowel movements and stools to be irregular.  You will have occasional bloating and cramping that should eventually fade away.  Until you are eating solid food normally, off all pain medications, and back to regular activities; your bowels will not be normal.   °Avoiding constipation °The goal: ONE SOFT BOWEL MOVEMENT A DAY!    °Drink plenty of fluids.  Choose water first. °TAKE A FIBER SUPPLEMENT EVERY DAY THE REST OF YOUR LIFE °During your first week back home, gradually add back a fiber supplement every day °Experiment which form you can tolerate.   There are many forms such as powders, tablets, wafers, gummies, etc °Psyllium bran (Metamucil), methylcellulose (Citrucel), Miralax or Glycolax, Benefiber, Flax Seed.    °Adjust the dose week-by-week (1/2 dose/day to 6 doses a day) until you are moving your bowels 1-2 times a day.  Cut back the dose or try a different fiber product if it is giving you problems such as diarrhea or bloating. °Sometimes a laxative is needed to help jump-start bowels if constipated until the fiber supplement can help regulate your bowels.  If you are tolerating eating & you are farting, it is okay to try a gentle laxative such as double dose MiraLax, prune juice, or Milk of Magnesia.  Avoid using laxatives too often. °Stool softeners can sometimes help counteract the constipating effects of narcotic pain medicines.  It can also cause diarrhea, so avoid using for too long. °If you are still constipated despite taking fiber daily, eating solids, and a few doses of laxatives, call our office. °Controlling diarrhea °Try drinking liquids and eating bland foods for a few days to avoid stressing your intestines further. °Avoid dairy products (especially milk & ice cream) for a short time.  The intestines often can lose the ability to digest lactose when stressed. °Avoid foods that cause gassiness or bloating.  Typical foods include beans and other legumes, cabbage, broccoli, and dairy foods.  Avoid greasy, spicy, fast foods.  Every person has some sensitivity to other foods, so listen to your body and avoid those foods that trigger problems for you. °Probiotics (such as active yogurt, Align, etc) may help repopulate the intestines and colon with normal bacteria and calm down a sensitive digestive tract °Adding a fiber supplement gradually can help thicken stools by absorbing excess fluid and retrain the intestines to act more normally.  Slowly increase the dose over a few weeks.  Too much fiber too soon can backfire and cause cramping & bloating. °It is okay to try and slow down diarrhea with a few doses of antidiarrheal medicines.   °Bismuth subsalicylate (ex. Kayopectate, Pepto Bismol) for a few doses can  help control diarrhea.  Avoid if pregnant.   °  Loperamide (Imodium) can slow down diarrhea.  Start with one tablet (2mg) first.  Avoid if you are having fevers or severe pain.  °ILEOSTOMY PATIENTS WILL HAVE CHRONIC DIARRHEA since their colon is not in use.    °Drink plenty of liquids.  You will need to drink even more glasses of water/liquid a day to avoid getting dehydrated. °Record output from your ileostomy.  Expect to empty the bag every 3-4 hours at first.  Most people with a permanent ileostomy empty their bag 4-6 times at the least.   °Use antidiarrheal medicine (especially Imodium) several times a day to avoid getting dehydrated.  Start with a dose at bedtime & breakfast.  Adjust up or down as needed.  Increase antidiarrheal medications as directed to avoid emptying the bag more than 8 times a day (every 3 hours). °Work with your wound ostomy nurse to learn care for your ostomy.  See ostomy care instructions. °TROUBLESHOOTING IRREGULAR BOWELS °1) Start with a soft & bland diet. No spicy, greasy, or fried foods.  °2) Avoid gluten/wheat or dairy products from diet to see if symptoms improve. °3) Miralax 17gm or flax seed mixed in 8oz. water or juice-daily. May use 2-4 times a day as needed. °4) Gas-X, Phazyme, etc. as needed for gas & bloating.  °5) Prilosec (omeprazole) over-the-counter as needed °6)  Consider probiotics (Align, Activa, etc) to help calm the bowels down ° °Call your doctor if you are getting worse or not getting better.  Sometimes further testing (cultures, endoscopy, X-ray studies, CT scans, bloodwork, etc.) may be needed to help diagnose and treat the cause of the diarrhea. °[facility] °[address], [address], [place], [place]  [address] °[phone] - Main.    °[phone]  - Toll Free.   [phone] - Fax °[email or web address] ° °Colorectal Cancer °Colorectal cancer is an abnormal growth of tissue (tumor) in the colon or rectum that is cancerous  (malignant). Unlike noncancerous (benign) tumors, malignant tumors can spread to other parts of your body. The colon is the large bowel or large intestine. The rectum is the last several inches of the colon.  °RISK FACTORS °The exact cause of colorectal cancer is unknown. However, the following factors may increase your chances of getting colorectal cancer:  °· Age older than 50 years.   °· Abnormal growths (polyps) on the inner wall of the colon or rectum.   °· Diabetes.   °· African American race.   °· Family history of hereditary nonpolyposis colorectal cancer. This condition is caused by changes in the genes that are responsible for repairing mismatched DNA.   °· Personal history of cancer. A person who has already had colorectal cancer may develop it a second time. Also, women with a history of ovarian, uterine, or breast cancer are at a somewhat higher risk of developing colorectal cancer. °· Certain hereditary conditions. °· Eating a diet that is high in fat (especially animal fat) and low in fiber, fruits, and vegetables. °· Sedentary lifestyle. °· Inflammatory bowel disease, including ulcerative colitis and Crohn's disease.   °· Smoking.   °· Excessive alcohol use.   °SYMPTOMS °Early colorectal cancer often does not cause symptoms. As the cancer grows, symptoms may include:  °· Changes in bowel habits. °· Diarrhea.   °· Constipation.   °· Feeling like the bowel does not empty completely after a bowel movement.   °· Blood in the stool.   °· Stools that are narrower than usual.   °· Abdominal discomfort, pain, bloating, fullness, or cramps. °· Frequent gas pain.   °·   Unexplained weight loss.   °· Constant tiredness.   °· Nausea and vomiting.   °DIAGNOSIS  °Your health care provider will ask about your medical history. He or she may also perform a number of procedures, such as:  °· A physical exam. °· A digital rectal exam. °· A fecal occult blood test. °· A barium enema.   °· Blood tests.   °· X-rays.    °· Imaging tests, such as CT scans or MRIs.   °· Taking a tissue sample (biopsy) from your colon or rectum to look for cancer cells.   °· A sigmoidoscopy to view the inside of the last part of your colon.   °· A colonoscopy to view the inside of your entire colon.   °· An endorectal ultrasound to see how deep a rectal tumor has grown and whether the cancer has spread to lymph nodes or other nearby tissues.   °Your cancer will be staged to determine its severity and extent. Staging is a careful attempt to find out the size of the tumor, whether the cancer has spread, and if so, to what parts of the body. You may need to have more tests to determine the stage of your cancer. The test results will help determine what treatment plan is best for you.  °· Stage 0. The cancer is found only in the innermost lining of the colon or rectum.   °· Stage I. The cancer has grown into the inner wall of the colon or rectum. The cancer has not yet reached the outer wall of the colon.   °· Stage II. The cancer extends more deeply into or through the wall of the colon or rectum. It may have invaded nearby tissue, but cancer cells have not spread to the lymph nodes.   °· Stage III. The cancer has spread to nearby lymph nodes but not to other parts of the body.   °· Stage IV. The cancer has spread to other parts of the body, such as the liver or lungs.   °Your health care provider may tell you the detailed stage of your cancer, which includes both a number and a letter.  °TREATMENT  °Depending on the type and stage, colorectal cancer may be treated with surgery, radiation therapy,  chemotherapy, targeted therapy, or radiofrequency ablation. Some people have a combination of these therapies. Surgery may be done to remove the polyps from your colon. In early stages, your health care provider may be able to do this during a colonoscopy. In later stages, surgery may be done to remove part of your colon.  °HOME CARE INSTRUCTIONS  °· Take  medicines only as directed by your health care provider.   °· Maintain a healthy diet.   °· Consider joining a support group. This may help you learn to cope with the stress of having colorectal cancer.   °· Seek advice to help you manage treatment of side effects.   °· Keep all follow-up visits as directed by your health care provider.   °· Inform your cancer specialist if you are admitted to the hospital.   °SEEK MEDICAL CARE IF: °· Your diarrhea or constipation does not go away.   °· Your bowel habits change. °· You have increased abdominal pain.   °· You notice new fatigue or weakness. °· You lose weight. °  °This information is not intended to replace advice given to you by your health care provider. Make sure you discuss any questions you have with your health care provider. °  °Document Released: 03/01/2005 Document Revised: 03/22/2014 Document Reviewed: 08/24/2012 °Elsevier Interactive Patient Education ©2016 [employer] ° ° °

## 2015-02-19 NOTE — Anesthesia Procedure Notes (Signed)
Procedure Name: Intubation Date/Time: 02/19/2015 11:58 AM Performed by: Lajuana Carry E Pre-anesthesia Checklist: Patient identified, Emergency Drugs available, Suction available and Patient being monitored Patient Re-evaluated:Patient Re-evaluated prior to inductionOxygen Delivery Method: Circle System Utilized Preoxygenation: Pre-oxygenation with 100% oxygen Intubation Type: IV induction Ventilation: Mask ventilation without difficulty Laryngoscope Size: Mac and 4 Grade View: Grade II Tube type: Oral Tube size: 7.5 mm Number of attempts: 1 Placement Confirmation: ETT inserted through vocal cords under direct vision,  positive ETCO2 and breath sounds checked- equal and bilateral Secured at: 20 cm Tube secured with: Tape Dental Injury: Teeth and Oropharynx as per pre-operative assessment

## 2015-02-20 LAB — CBC
HCT: 26.4 % — ABNORMAL LOW (ref 39.0–52.0)
Hemoglobin: 8.5 g/dL — ABNORMAL LOW (ref 13.0–17.0)
MCH: 26.9 pg (ref 26.0–34.0)
MCHC: 32.2 g/dL (ref 30.0–36.0)
MCV: 83.5 fL (ref 78.0–100.0)
PLATELETS: 239 10*3/uL (ref 150–400)
RBC: 3.16 MIL/uL — AB (ref 4.22–5.81)
RDW: 25.9 % — ABNORMAL HIGH (ref 11.5–15.5)
WBC: 10 10*3/uL (ref 4.0–10.5)

## 2015-02-20 LAB — MAGNESIUM: MAGNESIUM: 1.5 mg/dL — AB (ref 1.7–2.4)

## 2015-02-20 LAB — BASIC METABOLIC PANEL
Anion gap: 13 (ref 5–15)
BUN: 24 mg/dL — ABNORMAL HIGH (ref 6–20)
CALCIUM: 8.6 mg/dL — AB (ref 8.9–10.3)
CO2: 20 mmol/L — AB (ref 22–32)
CREATININE: 1.64 mg/dL — AB (ref 0.61–1.24)
Chloride: 108 mmol/L (ref 101–111)
GFR calc non Af Amer: 36 mL/min — ABNORMAL LOW (ref >60)
GFR, EST AFRICAN AMERICAN: 42 mL/min — AB (ref >60)
GLUCOSE: 192 mg/dL — AB (ref 65–99)
Potassium: 4.2 mmol/L (ref 3.5–5.1)
Sodium: 141 mmol/L (ref 135–145)

## 2015-02-20 LAB — GLUCOSE, CAPILLARY: GLUCOSE-CAPILLARY: 197 mg/dL — AB (ref 65–99)

## 2015-02-20 MED ORDER — LACTATED RINGERS IV BOLUS (SEPSIS): 1000.0000 mL | Freq: Once | INTRAVENOUS | Status: AC

## 2015-02-20 MED ORDER — METOPROLOL TARTRATE 12.5 MG HALF TABLET
12.5000 mg | ORAL_TABLET | Freq: Two times a day (BID) | ORAL | Status: DC
  Administered 2015-02-21 – 2015-02-22 (×3): 12.5 mg via ORAL
  Filled 2015-02-20 (×5): qty 1

## 2015-02-20 NOTE — Progress Notes (Signed)
[address] [place], [place] [address] Phone: [phone] FAX: [place] [phone] 1925-04-27   Problem List:   Principal Problem:   Cancer of cecum  Active Problems:   Benign hypertensive heart disease without heart failure   Hx of CABG   Hypercholesterolemia   CKD (chronic kidney disease) stage 3, GFR 30-59 ml/min   Essential hypertension   Chronic anticoagulation for mechanical heart valve   Cancer of ascending colon ([place])   1 Day Post-Op  02/19/2015  Procedure(s): XI ROBOT ASSISTED PROXIMAL RIGHT COLECTOMY  Assessment  OK  Plan:  -IVF bolus for probable dehydration -hold BP meds if still orthostatic -inc Cr w h/o CKD - UOP OK.  Follow s/p IVF bolus -f/u path -VTE prophylaxis- SCDs, etc -acute on chronic anemia from bleeding cancer -hold on Plavix until >48hrs of stable Hgb & has decent bowel fxn -mobilize as tolerated to help recovery.  PT seeing  D/C patient from hospital when patient meets criteria (anticipate in 3-7 day(s)):  Tolerating oral intake well Ambulating well Adequate pain control without IV medications Urinating  Having flatus Disposition planning in place  I updated the patient's status to the patient & the patient's nurse.  Recommendations were made.  Questions were answered.  They expressed understanding & appreciation.   [name], [name], F.A.C.S. Gastrointestinal and Minimally Invasive Surgery [facility] [address] [place], [place] [address] [phone] Main / Paging   02/20/2015  Subjective:  Sore but pain controlled Light-headed w SBP 70s when standing.  BP meds held Trying clears  Objective:  Vital signs:  Filed Vitals:   02/19/15 2200 02/20/15 0153 02/20/15 0355 02/20/15 0600  BP: 154/66 140/60 90/54 119/60  Pulse: 65 81 76 84  Temp: 97.7 F (36.5 C) 97.5 F (36.4 C)  97.6 F (36.4 C)   TempSrc: Oral Axillary  Oral  Resp: '18 18  16  ' Height:      Weight:    70.8 kg (156 lb 1.4 oz)  SpO2: 100% 99% 98% 99%    Last BM Date: 02/19/15  Intake/Output   Yesterday:  12/07 0701 - 12/08 0700 In: 2985 [I.V.:2935; IV Piggyback:50] Out: 900 [Urine:800; Blood:100] This shift:     Bowel function:  Flatus: n  BM: n  Drain: n/a  Physical Exam:  General: Pt awake/alert/oriented x4 in no acute distress Eyes: PERRL, normal EOM.  Sclera clear.  No icterus Neuro: CN II-XII intact w/o focal sensory/motor deficits. Lymph: No head/neck/groin lymphadenopathy Psych:  No delerium/psychosis/paranoia HENT: Normocephalic, Mucus membranes moist.  No thrush Neck: Supple, No tracheal deviation Chest: No chest wall pain w good excursion CV:  Pulses intact.  Regular rhythm MS: Normal AROM mjr joints.  No obvious deformity Abdomen: Soft.  Nondistended.  Mildly tender at incisions only.  No evidence of peritonitis.  No incarcerated hernias. Ext:  SCDs BLE.  No mjr edema.  No cyanosis Skin: No petechiae / purpura  Results:   Labs: Results for orders placed or performed during the hospital encounter of 02/19/15 (from the past 48 hour(s))  Type and screen [place]     Status: None   Collection Time: 02/19/15 10:30 AM  Result Value Ref Range   ABO/RH(D) O POS    Antibody Screen NEG    Sample Expiration 02/22/2015   ABO/Rh     Status: None   Collection Time: 02/19/15 10:30 AM  Result Value Ref Range   ABO/RH(D) O POS   Glucose, capillary     Status: Abnormal   Collection Time: 02/20/15  4:13 AM  Result Value Ref Range   Glucose-Capillary 197 (H) 65 - 99 mg/dL  Basic metabolic panel     Status: Abnormal   Collection Time: 02/20/15  4:32 AM  Result Value Ref Range   Sodium 141 135 - 145 mmol/L   Potassium 4.2 3.5 - 5.1 mmol/L   Chloride 108 101 - 111 mmol/L   CO2 20 (L) 22 - 32 mmol/L   Glucose, Bld 192 (H) 65 - 99 mg/dL   BUN 24 (H) 6 - 20 mg/dL    Creatinine, Ser 1.64 (H) 0.61 - 1.24 mg/dL   Calcium 8.6 (L) 8.9 - 10.3 mg/dL   GFR calc non Af Amer 36 (L) >60 mL/min   GFR calc Af Amer 42 (L) >60 mL/min    Comment: (NOTE) The eGFR has been calculated using the CKD EPI equation. This calculation has not been validated in all clinical situations. eGFR's persistently <60 mL/min signify possible Chronic Kidney Disease.    Anion gap 13 5 - 15  CBC     Status: Abnormal   Collection Time: 02/20/15  4:32 AM  Result Value Ref Range   WBC 10.0 4.0 - 10.5 K/uL   RBC 3.16 (L) 4.22 - 5.81 MIL/uL   Hemoglobin 8.5 (L) 13.0 - 17.0 g/dL   HCT 26.4 (L) 39.0 - 52.0 %   MCV 83.5 78.0 - 100.0 fL   MCH 26.9 26.0 - 34.0 pg   MCHC 32.2 30.0 - 36.0 g/dL   RDW 25.9 (H) 11.5 - 15.5 %   Platelets 239 150 - 400 K/uL  Magnesium     Status: Abnormal   Collection Time: 02/20/15  4:32 AM  Result Value Ref Range   Magnesium 1.5 (L) 1.7 - 2.4 mg/dL    Imaging / Studies: No results found.  Medications / Allergies: per chart  Antibiotics: Anti-infectives    Start     Dose/Rate Route Frequency Ordered Stop   02/19/15 2200  cefoTEtan (CEFOTAN) 2 g in dextrose 5 % 50 mL IVPB     2 g 100 mL/hr over 30 Minutes Intravenous Every 12 hours 02/19/15 1733 02/19/15 2244   02/19/15 1452  clindamycin (CLEOCIN) 900 mg, gentamicin (GARAMYCIN) 240 mg in sodium chloride 0.9 % 1,000 mL for intraperitoneal lavage  Status:  Discontinued       As needed 02/19/15 1452 02/19/15 1535   02/19/15 1012  cefoTEtan (CEFOTAN) 2 g in dextrose 5 % 50 mL IVPB     2 g 100 mL/hr over 30 Minutes Intravenous On call to O.R. 02/19/15 1012 02/19/15 1230   02/19/15 0600  clindamycin (CLEOCIN) 900 mg, gentamicin (GARAMYCIN) 240 mg in sodium chloride 0.9 % 1,000 mL for intraperitoneal lavage  Status:  Discontinued      Intraperitoneal To Surgery 02/18/15 1359 02/19/15 1713   02/18/15 1400  clindamycin (CLEOCIN) 900 mg, gentamicin (GARAMYCIN) 240 mg in sodium chloride 0.9 % 1,000 mL for  intraperitoneal lavage  Status:  Discontinued    Comments:  Pharmacy may adjust dosing strength, schedule, rate of infusion, etc as needed to optimize therapy   1 application Intraperitoneal To Surgery 02/18/15 1357 02/18/15 1359        Note: Portions of this report may have been transcribed using voice recognition software. Every effort was made to ensure accuracy; however, inadvertent computerized transcription errors may be  present.   Any transcriptional errors that result from this process are unintentional.     [name], [name], F.A.C.S. Gastrointestinal and Minimally Invasive Surgery [facility] [address] [place], [place] [address] [phone] Main / Paging   02/20/2015  CARE TEAM:  PCP: [name], MD  Outpatient Care Team: Patient Care Team: Seward Carol, MD as PCP - General (Internal Medicine) Darlin Coco, MD as Consulting Physician (Cardiology) Clarene Essex, MD as Consulting Physician (Gastroenterology) Michael Boston, MD as Consulting Physician (General Surgery)  Inpatient Treatment Team: Treatment Team: Attending Provider: Michael Boston, MD; Registered Nurse: Guadalupe Dawn, RN; Technician: Leda Quail, NT; Technician: Veleta Miners, NT; Registered Nurse: Josepha Pigg, RN; Occupational Therapist: Betsy Pries, OT; Technician: Sueanne Margarita, NT; Physical Therapist: Junius Argyle, PT

## 2015-02-20 NOTE — Progress Notes (Signed)
OT Cancellation Note  Patient Details Name: Michael Blanchard MRN: [id number] DOB: 10/17/1925   Cancelled Treatment:    Reason Eval/Treat Not Completed: Pain limiting ability to participate Will recheck on pt later in day or next day  Carlyon Shadow, [place] 02/20/2015, 12:05 PM

## 2015-02-20 NOTE — Progress Notes (Signed)
Dr Johney Maine notified of low output and orders noted.

## 2015-02-20 NOTE — Evaluation (Signed)
Physical Therapy Evaluation Patient Details Name: Michael Blanchard MRN: [id number] DOB: 08-25-1925 Today's Date: 02/20/2015   History of Present Illness  81 year old male s/p colectomy d/t colorectal cancer, PMH significant for HTN, HLD, AVD, CKD stage 3, stroke, arthritis, and fatigue.   Clinical Impression  Pt admitted with above diagnosis. Pt currently with functional limitations due to the deficits listed below (see PT Problem List). Pt will benefit from skilled PT to increase their independence and safety with mobility to allow discharge to the venue listed below. Pt attempted mobility with physical therapy, however, required assistance with bed mobility and transfers. Was not able to ambulate due to hypotension during standing and significant dizziness. 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 Recommend HHPT if patient progresses as anticipated, otherwise SNF would be a better option. No equipment needs at this time.        Follow Up Recommendations Home health PT;Supervision for mobility/OOB (may need SNF if doen't progress as anticipated )    Equipment Recommendations  None recommended by PT    Recommendations for Other Services       Precautions / Restrictions Precautions Precautions: Fall Precaution Comments: ?orthostatic hypotension Restrictions Weight Bearing Restrictions: No      Mobility  Bed Mobility Overal bed mobility: Needs Assistance Bed Mobility: Supine to Sit;Sit to Supine     Supine to sit: Min assist;HOB elevated Sit to supine: Min assist   General bed mobility comments: pt asked therapist's assistance by pulling both hands to assist with trunk elevation, cues for both LEs off the bed; pt gets distracted easily and requires multiple repeats of instructions to keep him involved in mobility  Transfers Overall transfer level: Needs assistance Equipment used: Rolling walker (2 wheeled) Transfers: Sit to/from  Stand;Lateral/Scoot Transfers Sit to Stand: Min assist        Lateral/Scoot Transfers: Min guard General transfer comment: increased time, multiple multi-modal cues for correct technique, pt asked for assistance, however, instructed pt on peforming sit to stand independently, required min assist for steadying. Pt was able to perform lateral scoot with min guard assist for safety.   Ambulation/Gait             General Gait Details: Pt became very dizzy and was not safe to ambulate, BP dropped to 77/89mmHg HR 104bpm during standing and increased to 106/6mmHg, HR 103bpm, suspect orthostatic hypotension    Stairs            Wheelchair Mobility    Modified Rankin (Stroke Patients Only)       Balance Overall balance assessment: Needs assistance Sitting-balance support: Feet supported Sitting balance-Leahy Scale: Fair     Standing balance support: Bilateral upper extremity supported Standing balance-Leahy Scale: Poor (pt is dizzy and had to sit down to prevent a fall )                               Pertinent Vitals/Pain Pain Assessment: 0-10 Pain Score: 3  Pain Location: abdomen, during mobility Pain Descriptors / Indicators: Sore Pain Intervention(s): Monitored during session;Repositioned    Home Living Family/patient expects to be discharged to:: Private residence Living Arrangements: Spouse/significant other Available Help at Discharge: Family (daughter around part of the day) Type of Home: House Home Access: Ramped entrance     Home Layout: One level Home Equipment: Environmental consultant - 2 wheels;Cane - single point  Prior Function Level of Independence: Independent with assistive device(s)         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)     Hand Dominance        Extremity/Trunk Assessment   Upper Extremity Assessment: Generalized weakness (arthritis in both shoulders R>L )           Lower  Extremity Assessment: Generalized weakness      Cervical / Trunk Assessment: Normal  Communication   Communication: No difficulties  Cognition Arousal/Alertness: Awake/alert Behavior During Therapy: WFL for tasks assessed/performed Overall Cognitive Status: Within Functional Limits for tasks assessed                      General Comments      Exercises        Assessment/Plan    PT Assessment Patient needs continued PT services  PT Diagnosis Difficulty walking;Generalized weakness;Acute pain   PT Problem List Decreased strength;Decreased activity tolerance;Decreased balance;Decreased mobility;Decreased safety awareness;Decreased knowledge of precautions;Decreased knowledge of use of DME;Pain  PT Treatment Interventions DME instruction;Gait training;Functional mobility training;Therapeutic activities;Therapeutic exercise;Balance training;Patient/family education   PT Goals (Current goals can be found in the Care Plan section) Acute Rehab PT Goals Patient Stated Goal: to go home PT Goal Formulation: With patient Time For Goal Achievement: 03/06/15 Potential to Achieve Goals: Good    Frequency Min 3X/week   Barriers to discharge        Co-evaluation               End of Session Equipment Utilized During Treatment: Gait belt Activity Tolerance: Treatment limited secondary to medical complications (Comment) (orthostatic hypotension) Patient left: in bed;with call bell/phone within reach;with bed alarm set Nurse Communication: Mobility status;Other (comment) (decreased BP during standing)         Time: 0935-1000 PT Time Calculation (min) (ACUTE ONLY): 25 min   Charges:   PT Evaluation $Initial PT Evaluation Tier I: 1 Procedure     PT G Codes:        Randy Castrejon, SPT February 26, 2015, 12:55 PM

## 2015-02-20 NOTE — Anesthesia Postprocedure Evaluation (Signed)
Anesthesia Post Note  Patient: Michael Blanchard [facility]  Procedure(s) Performed: Procedure(s) (LRB): XI ROBOT ASSISTED PROXIMAL RIGHT COLECTOMY (Right)  Patient location during evaluation: PACU Anesthesia Type: General Level of consciousness: awake and alert Pain management: pain level controlled Vital Signs Assessment: post-procedure vital signs reviewed and stable Respiratory status: spontaneous breathing, nonlabored ventilation, respiratory function stable and patient connected to nasal cannula oxygen Cardiovascular status: blood pressure returned to baseline and stable Postop Assessment: no signs of nausea or vomiting Anesthetic complications: no    Last Vitals:  Filed Vitals:   02/20/15 0355 02/20/15 0600  BP: 90/54 119/60  Pulse: 76 84  Temp:  36.4 C  Resp:  16    Last Pain:  Filed Vitals:   02/20/15 0830  PainSc: 8                  Montez Hageman

## 2015-02-20 NOTE — Progress Notes (Addendum)
Pt with request to use BSC 0345 am. Transferred pt with assist x1. Pt able to stand and pivot well. Staying with pt while on [facility] noted patient to become limp and not responding. I called for assistance and team helped to transfer pt to bed from [facility]. Rapid Response NurseGilmer Mor) called. In the mean while a sternal rub was given to patient and patient responded and started to talk and cough a little. Suction set up  and used to assist patient to clear phlegm from throat. Vital signs taken B/P 96/66, Pulse 78 , respirations at 16, pox sat 98% on o2 2L, CBG result was 197.  Per Rapid Response nurse patient may have had a vagal response trying to force a bowel movement. Vital signs are stable at this time, IVFs' infusing without difficulty. Call out to covering [name] Marlou Starks [phone] at 04:22 am awaiting a  Call back.   Pt with c/o of pain and tenderness on the  L side surgical site of  abdomen . No shortness of breath.  Abdominal surgical site with dsg marked from earlier drainage noted, no drainage extended beyond earlier marked.  Call light and phone in reach, bed exit alarm set and monitoring of patient continued per Doctor orders and unit protocol.   repaged on call [name] At 04:49 am, Dr. Marlou Starks with call back made aware, no new orders given at this time.

## 2015-02-21 LAB — CREATININE, SERUM
CREATININE: 2.09 mg/dL — AB (ref 0.61–1.24)
GFR calc Af Amer: 31 mL/min — ABNORMAL LOW (ref >60)
GFR, EST NON AFRICAN AMERICAN: 27 mL/min — AB (ref >60)

## 2015-02-21 LAB — HEMOGLOBIN: Hemoglobin: 6.3 g/dL — CL (ref 13.0–17.0)

## 2015-02-21 LAB — PREPARE RBC (CROSSMATCH)

## 2015-02-21 LAB — POTASSIUM: POTASSIUM: 4.6 mmol/L (ref 3.5–5.1)

## 2015-02-21 MED ORDER — ENOXAPARIN SODIUM 40 MG/0.4ML ~~LOC~~ SOLN: 40.0000 mg | SUBCUTANEOUS | Status: DC

## 2015-02-21 MED ORDER — FUROSEMIDE 10 MG/ML IJ SOLN
20.0000 mg | Freq: Once | INTRAMUSCULAR | Status: AC
  Administered 2015-02-21: 20 mg via INTRAVENOUS
  Filled 2015-02-21: qty 2

## 2015-02-21 MED ORDER — ENOXAPARIN SODIUM 30 MG/0.3ML ~~LOC~~ SOLN
30.0000 mg | SUBCUTANEOUS | Status: DC
  Administered 2015-02-23 – 2015-02-24 (×2): 30 mg via SUBCUTANEOUS
  Filled 2015-02-21 (×2): qty 0.3

## 2015-02-21 MED ORDER — RESOURCE THICKENUP CLEAR PO POWD
ORAL | Status: DC | PRN
  Filled 2015-02-21: qty 125

## 2015-02-21 MED ORDER — SODIUM CHLORIDE 0.9 % IV BOLUS (SEPSIS)
500.0000 mL | Freq: Once | INTRAVENOUS | Status: AC
  Administered 2015-02-21: 500 mL via INTRAVENOUS

## 2015-02-21 MED ORDER — SACCHAROMYCES BOULARDII 250 MG PO CAPS
250.0000 mg | ORAL_CAPSULE | Freq: Two times a day (BID) | ORAL | Status: DC
Start: 2015-02-21 — End: 2015-02-24
  Administered 2015-02-21 – 2015-02-24 (×7): 250 mg via ORAL
  Filled 2015-02-21 (×8): qty 1

## 2015-02-21 MED ORDER — SODIUM CHLORIDE 0.9 % IV SOLN: Freq: Once | INTRAVENOUS | Status: DC

## 2015-02-21 MED ORDER — LIP MEDEX EX OINT
1.0000 "application " | TOPICAL_OINTMENT | Freq: Two times a day (BID) | CUTANEOUS | Status: DC
  Administered 2015-02-21 – 2015-02-24 (×6): 1 via TOPICAL

## 2015-02-21 MED ORDER — ENSURE ENLIVE PO LIQD
237.0000 mL | Freq: Two times a day (BID) | ORAL | Status: DC
  Administered 2015-02-21 – 2015-02-24 (×3): 237 mL via ORAL

## 2015-02-21 MED ORDER — ENOXAPARIN SODIUM 40 MG/0.4ML ~~LOC~~ SOLN
40.0000 mg | SUBCUTANEOUS | Status: DC
  Filled 2015-02-21: qty 0.4

## 2015-02-21 NOTE — Evaluation (Signed)
Occupational Therapy Evaluation Patient Details Name: Michael Blanchard MRN: [id number] DOB: [birth year] Today's Date: 02/21/2015    History of Present Illness 81 year old male s/p colectomy d/t colorectal cancer, PMH significant for HTN, HLD, AVD, CKD stage 3, stroke, arthritis, and fatigue.    Clinical Impression   Pt admitted with colorectal cancer. Pt currently with functional limitations due to the deficits listed below (see OT Problem List).  Pt will benefit from skilled OT to increase their safety and independence with ADL and functional mobility for ADL to facilitate discharge to venue listed below.      Follow Up Recommendations  Home health OT;Supervision/Assistance - 24 hour    Equipment Recommendations  3 in 1 bedside comode    Recommendations for Other Services       Precautions / Restrictions Precautions Precautions: Fall      Mobility Bed Mobility Overal bed mobility: Needs Assistance Bed Mobility: Supine to Sit;Sit to Supine     Supine to sit: Min assist;HOB elevated        Transfers Overall transfer level: Needs assistance Equipment used: Rolling walker (2 wheeled) Transfers: Sit to/from Stand Sit to Stand: Min assist        Lateral/Scoot Transfers: Min guard           ADL Overall ADL's : Needs assistance/impaired     Grooming: Set up   Upper Body Bathing: Set up;Sitting   Lower Body Bathing: Minimal assistance;Sit to/from stand   Upper Body Dressing : Set up;Sitting   Lower Body Dressing: Minimal assistance;Sit to/from stand   Toilet Transfer: Minimal assistance;Cueing for safety;Ambulation;RW   Toileting- Clothing Manipulation and Hygiene: Minimal assistance;Sit to/from stand       Functional mobility during ADLs: Minimal assistance;Rolling walker General ADL Comments: fatigues quickly               Pertinent Vitals/Pain Pain Score: 4  Pain Location: abdomen Pain Descriptors / Indicators: Sore Pain Intervention(s):  Monitored during session     Hand Dominance     Extremity/Trunk Assessment Upper Extremity Assessment Upper Extremity Assessment: Generalized weakness           Communication Communication Communication: No difficulties   Cognition Arousal/Alertness: Awake/alert Behavior During Therapy: WFL for tasks assessed/performed Overall Cognitive Status: Within Functional Limits for tasks assessed                                Home Living Family/patient expects to be discharged to:: Private residence Living Arrangements: Spouse/significant other Available Help at Discharge: Family (daughter around part of the day) Type of Home: House Home Access: Ramped entrance     Home Layout: One level               Home Equipment: Environmental consultant - 2 wheels;Cane - single point          Prior Functioning/Environment Level of Independence: Independent with assistive device(s)        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)    OT Diagnosis: Generalized weakness   OT Problem List: Decreased strength;Decreased activity tolerance   OT Treatment/Interventions: Self-care/ADL training;DME and/or AE instruction;Patient/family education    OT Goals(Current goals can be found in the care plan section) Acute Rehab OT Goals Patient Stated Goal: to go home OT Goal Formulation: With patient Time For Goal Achievement: 03/07/15 Potential to Achieve Goals: Good  OT Frequency: Min 2X/week  End of Session Nurse Communication: Mobility status  Activity Tolerance: Patient tolerated treatment well Patient left: in chair   Time: [id number] OT Time Calculation (min): 28 min Charges:  OT General Charges $OT Visit: 1 Procedure OT Evaluation $Initial OT Evaluation Tier I: 1 Procedure OT Treatments $Self Care/Home Management : 8-22 mins G-Codes:    Payton Mccallum D 03-10-2015, 1:58 PM

## 2015-02-21 NOTE — Progress Notes (Signed)
[facility]  [address] [place], [place] [address] Phone: [phone] FAX: [place] [phone] Oct 29, 1925   Problem List:   Principal Problem:   Cancer of cecum  Active Problems:   Benign hypertensive heart disease without heart failure   Hx of CABG   S/P aortic valve replacement with bioprosthetic valve   Hypercholesterolemia   Osteoarthritis   Symptomatic anemia   CKD (chronic kidney disease) stage 3, GFR 30-59 ml/min   Essential hypertension   Chronic anticoagulation for mechanical heart valve   Cancer of ascending colon ([place])   2 Days Post-Op  02/19/2015  Procedure(s): XI ROBOT ASSISTED PROXIMAL RIGHT COLECTOMY  Assessment  Symptomatic anemia  Plan:  -2 U PBC for anemia -hold anticoag x 48 hr -IVF bolus for probable dehydration today - switch to diuretics tomorrow -hold BP meds if still orthostatic -inc Cr w h/o CKD - Follow s/p transfusion -f/u path -VTE prophylaxis- SCDs, etc -acute on chronic anemia from bleeding cancer - transfuse -hold on Plavix until >48hrs of stable Hgb & has decent bowel fxn -mobilize as tolerated to help recovery.  PT seeing.  Get Hgb better 1st  I updated the patient's status to the patient & the patient's nurse.  Recommendations were made.  Questions were answered.  They expressed understanding & appreciation.   [name], [name], F.A.C.S. Gastrointestinal and Minimally Invasive Surgery [facility] [address] [place], [place] [address] [phone] Main / Paging   02/21/2015  Subjective:  Low Hgb Burning at tip w urination - better now Trying clears but not much appetite  Objective:  Vital signs:  Filed Vitals:   02/20/15 2137 02/20/15 2200 02/21/15 0200 02/21/15 0600  BP: 140/50 148/49 144/58 123/49  Pulse: 72 79 95 95  Temp:  98.1 F (36.7 C) 98.2 F (36.8 C) 98.5 F (36.9 C)  TempSrc:  Oral Oral  Oral  Resp:  '16 16 16  ' Height:      Weight:    75.7 kg (166 lb 14.2 oz)  SpO2:  99% 96% 95%    Last BM Date: 02/19/15  Intake/Output   Yesterday:  12/08 0701 - 12/09 0700 In: 2775 [P.O.:600; I.V.:175; IV Piggyback:2000] Out: 200 [Urine:200] This shift:     Bowel function:  Flatus: n  BM: n  Drain: n/a  Physical Exam:  General: Pt awake/alert/oriented x4 in no acute distress.  Pale Eyes: PERRL, normal EOM.  Sclera clear.  No icterus Neuro: CN II-XII intact w/o focal sensory/motor deficits. Lymph: No head/neck/groin lymphadenopathy Psych:  No delerium/psychosis/paranoia HENT: Normocephalic, Mucus membranes moist.  No thrush Neck: Supple, No tracheal deviation Chest: No chest wall pain w good excursion CV:  Pulses intact.  Regular rhythm MS: Normal AROM mjr joints.  No obvious deformity Abdomen: Soft.  Nondistended.  Mildly tender at incisions only.  No evidence of peritonitis.  No incarcerated hernias. Ext:  SCDs BLE.  No mjr edema.  No cyanosis Skin: No petechiae / purpura.  No ecchymosis  Results:   Labs: Results for orders placed or performed during the hospital encounter of 02/19/15 (from the past 48 hour(s))  Type and screen [place]     Status: None (Preliminary result)   Collection Time: 02/19/15 10:30 AM  Result Value Ref Range   ABO/RH(D) O POS    Antibody Screen NEG    Sample Expiration 02/22/2015    Unit Number [id number]  Blood Component Type RED CELLS,LR    Unit division 00    Status of Unit ALLOCATED    Transfusion Status OK TO TRANSFUSE    Crossmatch Result Compatible   ABO/Rh     Status: None   Collection Time: 02/19/15 10:30 AM  Result Value Ref Range   ABO/RH(D) O POS   Glucose, capillary     Status: Abnormal   Collection Time: 02/20/15  4:13 AM  Result Value Ref Range   Glucose-Capillary 197 (H) 65 - 99 mg/dL  Basic metabolic panel     Status: Abnormal   Collection Time: 02/20/15  4:32 AM  Result Value Ref  Range   Sodium 141 135 - 145 mmol/L   Potassium 4.2 3.5 - 5.1 mmol/L   Chloride 108 101 - 111 mmol/L   CO2 20 (L) 22 - 32 mmol/L   Glucose, Bld 192 (H) 65 - 99 mg/dL   BUN 24 (H) 6 - 20 mg/dL   Creatinine, Ser 1.64 (H) 0.61 - 1.24 mg/dL   Calcium 8.6 (L) 8.9 - 10.3 mg/dL   GFR calc non Af Amer 36 (L) >60 mL/min   GFR calc Af Amer 42 (L) >60 mL/min    Comment: (NOTE) The eGFR has been calculated using the CKD EPI equation. This calculation has not been validated in all clinical situations. eGFR's persistently <60 mL/min signify possible Chronic Kidney Disease.    Anion gap 13 5 - 15  CBC     Status: Abnormal   Collection Time: 02/20/15  4:32 AM  Result Value Ref Range   WBC 10.0 4.0 - 10.5 K/uL   RBC 3.16 (L) 4.22 - 5.81 MIL/uL   Hemoglobin 8.5 (L) 13.0 - 17.0 g/dL   HCT 26.4 (L) 39.0 - 52.0 %   MCV 83.5 78.0 - 100.0 fL   MCH 26.9 26.0 - 34.0 pg   MCHC 32.2 30.0 - 36.0 g/dL   RDW 25.9 (H) 11.5 - 15.5 %   Platelets 239 150 - 400 K/uL  Magnesium     Status: Abnormal   Collection Time: 02/20/15  4:32 AM  Result Value Ref Range   Magnesium 1.5 (L) 1.7 - 2.4 mg/dL  Potassium     Status: None   Collection Time: 02/21/15  4:33 AM  Result Value Ref Range   Potassium 4.6 3.5 - 5.1 mmol/L  Creatinine, serum     Status: Abnormal   Collection Time: 02/21/15  4:33 AM  Result Value Ref Range   Creatinine, Ser 2.09 (H) 0.61 - 1.24 mg/dL   GFR calc non Af Amer 27 (L) >60 mL/min   GFR calc Af Amer 31 (L) >60 mL/min    Comment: (NOTE) The eGFR has been calculated using the CKD EPI equation. This calculation has not been validated in all clinical situations. eGFR's persistently <60 mL/min signify possible Chronic Kidney Disease.   Hemoglobin     Status: Abnormal   Collection Time: 02/21/15  4:33 AM  Result Value Ref Range   Hemoglobin 6.3 (LL) 13.0 - 17.0 g/dL    Comment: DELTA CHECK NOTED REPEATED TO VERIFY CRITICAL RESULT CALLED TO, READ BACK BY AND VERIFIED WITH: HOGAN,M/5W  '@0454'  ON 02/21/15 BY KARCZEWSKI,S.   Prepare RBC (crossmatch)     Status: None   Collection Time: 02/21/15  5:50 AM  Result Value Ref Range   Order Confirmation ORDER PROCESSED BY BLOOD BANK     Imaging / Studies: No results found.  Medications / Allergies: per chart  Antibiotics: Anti-infectives    Start     Dose/Rate Route Frequency Ordered Stop   02/19/15 2200  cefoTEtan (CEFOTAN) 2 g in dextrose 5 % 50 mL IVPB     2 g 100 mL/hr over 30 Minutes Intravenous Every 12 hours 02/19/15 1733 02/19/15 2244   02/19/15 1452  clindamycin (CLEOCIN) 900 mg, gentamicin (GARAMYCIN) 240 mg in sodium chloride 0.9 % 1,000 mL for intraperitoneal lavage  Status:  Discontinued       As needed 02/19/15 1452 02/19/15 1535   02/19/15 1012  cefoTEtan (CEFOTAN) 2 g in dextrose 5 % 50 mL IVPB     2 g 100 mL/hr over 30 Minutes Intravenous On call to O.R. 02/19/15 1012 02/19/15 1230   02/19/15 0600  clindamycin (CLEOCIN) 900 mg, gentamicin (GARAMYCIN) 240 mg in sodium chloride 0.9 % 1,000 mL for intraperitoneal lavage  Status:  Discontinued      Intraperitoneal To Surgery 02/18/15 1359 02/19/15 1713   02/18/15 1400  clindamycin (CLEOCIN) 900 mg, gentamicin (GARAMYCIN) 240 mg in sodium chloride 0.9 % 1,000 mL for intraperitoneal lavage  Status:  Discontinued    Comments:  Pharmacy may adjust dosing strength, schedule, rate of infusion, etc as needed to optimize therapy   1 application Intraperitoneal To Surgery 02/18/15 1357 02/18/15 1359        Note: Portions of this report may have been transcribed using voice recognition software. Every effort was made to ensure accuracy; however, inadvertent computerized transcription errors may be present.   Any transcriptional errors that result from this process are unintentional.     [name], [name], F.A.C.S. Gastrointestinal and Minimally Invasive Surgery [facility] [address] [place], [place] [address] [phone] Main / Paging   02/21/2015  CARE TEAM:  PCP: [name], MD  Outpatient Care Team: Patient Care Team: Seward Carol, MD as PCP - General (Internal Medicine) Darlin Coco, MD as Consulting Physician (Cardiology) Clarene Essex, MD as Consulting Physician (Gastroenterology) Michael Boston, MD as Consulting Physician (General Surgery)  Inpatient Treatment Team: Treatment Team: Attending Provider: Michael Boston, MD; Technician: Leda Quail, NT; Technician: Veleta Miners, NT; Occupational Therapist: Betsy Pries, OT; Technician: Sueanne Margarita, NT; Registered Nurse: Oleta Mouse, RN; Registered Nurse: Dorene Sorrow, RN

## 2015-02-21 NOTE — Progress Notes (Signed)
Dr. Harlow Asa notified of pt's decreased UOP.  Order given for NS 500cc bolus

## 2015-02-21 NOTE — Progress Notes (Signed)
Dr. Harlow Asa notified of pt's HGB of 6.3, pt's current labs, vitals and decreased UOP status.  Order rec'd to type and cross 2uprc and hold for now.

## 2015-02-22 LAB — CREATININE, SERUM
CREATININE: 1.68 mg/dL — AB (ref 0.61–1.24)
GFR calc Af Amer: 41 mL/min — ABNORMAL LOW (ref >60)
GFR calc non Af Amer: 35 mL/min — ABNORMAL LOW (ref >60)

## 2015-02-22 LAB — POTASSIUM: POTASSIUM: 4.1 mmol/L (ref 3.5–5.1)

## 2015-02-22 LAB — MAGNESIUM: Magnesium: 2 mg/dL (ref 1.7–2.4)

## 2015-02-22 LAB — HEMOGLOBIN: Hemoglobin: 7.6 g/dL — ABNORMAL LOW (ref 13.0–17.0)

## 2015-02-22 MED ORDER — HYDROMORPHONE HCL 1 MG/ML IJ SOLN
0.5000 mg | INTRAMUSCULAR | Status: DC | PRN
  Administered 2015-02-22: 0.5 mg via INTRAVENOUS
  Filled 2015-02-22: qty 1

## 2015-02-22 MED ORDER — LACTATED RINGERS IV BOLUS (SEPSIS): 1000.0000 mL | Freq: Three times a day (TID) | INTRAVENOUS | Status: AC | PRN

## 2015-02-22 MED ORDER — METOPROLOL TARTRATE 12.5 MG HALF TABLET
12.5000 mg | ORAL_TABLET | Freq: Every day | ORAL | Status: DC
  Filled 2015-02-22: qty 1

## 2015-02-22 MED ORDER — FERROUS SULFATE 325 (65 FE) MG PO TABS
325.0000 mg | ORAL_TABLET | Freq: Two times a day (BID) | ORAL | Status: DC
  Administered 2015-02-22 – 2015-02-24 (×4): 325 mg via ORAL
  Filled 2015-02-22 (×6): qty 1

## 2015-02-22 NOTE — Progress Notes (Signed)
[facility]  [address] [place], [place] [address] Phone: [phone] FAX: [place] [phone] [birth year]   Problem List:   Principal Problem:   Cancer of cecum  Active Problems:   Benign hypertensive heart disease without heart failure   Hx of CABG   S/P aortic valve replacement with bioprosthetic valve   Hypercholesterolemia   Osteoarthritis   Symptomatic anemia   CKD (chronic kidney disease) stage 3, GFR 30-59 ml/min   Essential hypertension   Chronic anticoagulation for mechanical heart valve   Cancer of ascending colon ([place])   3 Days Post-Op  02/19/2015  Procedure(s): XI ROBOT ASSISTED PROXIMAL RIGHT COLECTOMY  Assessment  Feeling better but tired  Plan:   -adv diet -bowel regimen -hold anticoag until tomorrow -wean iVF with IVF bolus PRN hold BP meds if still orthostatic -inc Cr w h/o CKD - Follow s/p transfusion -f/u path -VTE prophylaxis- SCDs, etc -acute on chronic anemia from bleeding cancer - transfuse -ARF resolving -hold on Plavix until >48hrs of stable Hgb & has decent bowel fxn -mobilize as tolerated to help recovery.  PT seeing.  Get Hgb better 1st  I updated the patient's status to the patient & the patient's nurse.  Recommendations were made.  Questions were answered.  They expressed understanding & appreciation.   [name], [name], F.A.C.S. Gastrointestinal and Minimally Invasive Surgery [facility] [address] [place], [place] [address] [phone] Main / Paging   02/22/2015  Subjective:  Low Hgb - transfused Tol pureed but appetite OK  Objective:  Vital signs:  Filed Vitals:   02/21/15 1200 02/21/15 1236 02/21/15 1330 02/21/15 2238  BP: 133/52 114/52 110/42 130/55  Pulse: 73 80 73 89  Temp: 99.1 F (37.3 C) 98.6 F (37 C) 98.1 F (36.7 C) 98.2 F (36.8 C)  TempSrc: Oral Oral Oral Oral  Resp: 16   16   Height:      Weight:      SpO2: 95% 94% 91% 90%    Last BM Date: 02/21/15  Intake/Output   Yesterday:  12/09 0701 - 12/10 0700 In: 1125.5 [P.O.:600; Blood:525.5] Out: 500 [Urine:500] This shift:     Bowel function:  Flatus: y  BM: y  Drain: n/a  Physical Exam:  General: Pt awake/alert/oriented x4 in no acute distress.  Pale Eyes: PERRL, normal EOM.  Sclera clear.  No icterus Neuro: CN II-XII intact w/o focal sensory/motor deficits. Lymph: No head/neck/groin lymphadenopathy Psych:  No delerium/psychosis/paranoia HENT: Normocephalic, Mucus membranes moist.  No thrush Neck: Supple, No tracheal deviation Chest: No chest wall pain w good excursion CV:  Pulses intact.  Regular rhythm MS: Normal AROM mjr joints.  No obvious deformity Abdomen: Soft.  Nondistended.  Mildly tender at incisions only.  No evidence of peritonitis.  No incarcerated hernias.  LLQ flank ecchymosis Ext:  SCDs BLE.  No mjr edema.  No cyanosis Skin: No petechiae / purpura.  No ecchymosis  Results:   Labs: Results for orders placed or performed during the hospital encounter of 02/19/15 (from the past 48 hour(s))  Potassium     Status: None   Collection Time: 02/21/15  4:33 AM  Result Value Ref Range   Potassium 4.6 3.5 - 5.1 mmol/L  Creatinine, serum     Status: Abnormal   Collection Time: 02/21/15  4:33 AM  Result Value Ref Range   Creatinine, Ser 2.09 (H) 0.61 - 1.24 mg/dL  GFR calc non Af Amer 27 (L) >60 mL/min   GFR calc Af Amer 31 (L) >60 mL/min    Comment: (NOTE) The eGFR has been calculated using the CKD EPI equation. This calculation has not been validated in all clinical situations. eGFR's persistently <60 mL/min signify possible Chronic Kidney Disease.   Hemoglobin     Status: Abnormal   Collection Time: 02/21/15  4:33 AM  Result Value Ref Range   Hemoglobin 6.3 (LL) 13.0 - 17.0 g/dL    Comment: DELTA CHECK NOTED REPEATED TO VERIFY CRITICAL RESULT CALLED TO, READ BACK BY AND  VERIFIED WITH: HOGAN,M/5W '@0454'  ON 02/21/15 BY KARCZEWSKI,S.   Prepare RBC (crossmatch)     Status: None   Collection Time: 02/21/15  5:50 AM  Result Value Ref Range   Order Confirmation ORDER PROCESSED BY BLOOD BANK   Hemoglobin     Status: Abnormal   Collection Time: 02/22/15  5:24 AM  Result Value Ref Range   Hemoglobin 7.6 (L) 13.0 - 17.0 g/dL  Potassium     Status: None   Collection Time: 02/22/15  5:24 AM  Result Value Ref Range   Potassium 4.1 3.5 - 5.1 mmol/L  Creatinine, serum     Status: Abnormal   Collection Time: 02/22/15  5:24 AM  Result Value Ref Range   Creatinine, Ser 1.68 (H) 0.61 - 1.24 mg/dL   GFR calc non Af Amer 35 (L) >60 mL/min   GFR calc Af Amer 41 (L) >60 mL/min    Comment: (NOTE) The eGFR has been calculated using the CKD EPI equation. This calculation has not been validated in all clinical situations. eGFR's persistently <60 mL/min signify possible Chronic Kidney Disease.   Magnesium     Status: None   Collection Time: 02/22/15  5:24 AM  Result Value Ref Range   Magnesium 2.0 1.7 - 2.4 mg/dL    Imaging / Studies: No results found.  Medications / Allergies: per chart  Antibiotics: Anti-infectives    Start     Dose/Rate Route Frequency Ordered Stop   02/19/15 2200  cefoTEtan (CEFOTAN) 2 g in dextrose 5 % 50 mL IVPB     2 g 100 mL/hr over 30 Minutes Intravenous Every 12 hours 02/19/15 1733 02/19/15 2244   02/19/15 1452  clindamycin (CLEOCIN) 900 mg, gentamicin (GARAMYCIN) 240 mg in sodium chloride 0.9 % 1,000 mL for intraperitoneal lavage  Status:  Discontinued       As needed 02/19/15 1452 02/19/15 1535   02/19/15 1012  cefoTEtan (CEFOTAN) 2 g in dextrose 5 % 50 mL IVPB     2 g 100 mL/hr over 30 Minutes Intravenous On call to O.R. 02/19/15 1012 02/19/15 1230   02/19/15 0600  clindamycin (CLEOCIN) 900 mg, gentamicin (GARAMYCIN) 240 mg in sodium chloride 0.9 % 1,000 mL for intraperitoneal lavage  Status:  Discontinued      Intraperitoneal To  Surgery 02/18/15 1359 02/19/15 1713   02/18/15 1400  clindamycin (CLEOCIN) 900 mg, gentamicin (GARAMYCIN) 240 mg in sodium chloride 0.9 % 1,000 mL for intraperitoneal lavage  Status:  Discontinued    Comments:  Pharmacy may adjust dosing strength, schedule, rate of infusion, etc as needed to optimize therapy   1 application Intraperitoneal To Surgery 02/18/15 1357 02/18/15 1359        Note: Portions of this report may have been transcribed using voice recognition software. Every effort was made to ensure accuracy; however, inadvertent computerized transcription errors may be present.   Any transcriptional errors  that result from this process are unintentional.     [name], [name], F.A.C.S. Gastrointestinal and Minimally Invasive Surgery [facility] [address], [place], [place] [address] [phone] Main / Paging   02/22/2015  CARE TEAM:  PCP: [name], MD  Outpatient Care Team: Patient Care Team: Seward Carol, MD as PCP - General (Internal Medicine) Darlin Coco, MD as Consulting Physician (Cardiology) Clarene Essex, MD as Consulting Physician (Gastroenterology) Michael Boston, MD as Consulting Physician (General Surgery)  Inpatient Treatment Team: Treatment Team: Attending Provider: Michael Boston, MD; Technician: Leda Quail, NT; Technician: Veleta Miners, NT; Technician: Sueanne Margarita, NT; Registered Nurse: Oleta Mouse, RN; Technician: Abbe Amsterdam, NT; Registered Nurse: Josepha Pigg, RN; Technician: Coralie Carpen, NT

## 2015-02-23 LAB — CREATININE, SERUM
Creatinine, Ser: 1.47 mg/dL — ABNORMAL HIGH (ref 0.61–1.24)
GFR, EST AFRICAN AMERICAN: 48 mL/min — AB (ref >60)
GFR, EST NON AFRICAN AMERICAN: 41 mL/min — AB (ref >60)

## 2015-02-23 LAB — TYPE AND SCREEN
ABO/RH(D): O POS
Antibody Screen: NEGATIVE
UNIT DIVISION: 0
UNIT DIVISION: 0
Unit division: 0

## 2015-02-23 LAB — POTASSIUM: POTASSIUM: 4 mmol/L (ref 3.5–5.1)

## 2015-02-23 LAB — HEMOGLOBIN: Hemoglobin: 8.1 g/dL — ABNORMAL LOW (ref 13.0–17.0)

## 2015-02-23 MED ORDER — ACETAMINOPHEN 500 MG PO TABS
500.0000 mg | ORAL_TABLET | Freq: Three times a day (TID) | ORAL | Status: DC
  Administered 2015-02-23 – 2015-02-24 (×5): 500 mg via ORAL
  Filled 2015-02-23 (×9): qty 1

## 2015-02-23 MED ORDER — HYDROCODONE-ACETAMINOPHEN 7.5-325 MG PO TABS
1.0000 | ORAL_TABLET | ORAL | Status: DC | PRN
  Administered 2015-02-23: 1 via ORAL
  Filled 2015-02-23: qty 1

## 2015-02-23 MED ORDER — BOOST PLUS PO LIQD
237.0000 mL | Freq: Three times a day (TID) | ORAL | Status: DC
  Administered 2015-02-23 – 2015-02-24 (×3): 237 mL via ORAL
  Filled 2015-02-23 (×4): qty 237

## 2015-02-23 MED ORDER — METOPROLOL TARTRATE 12.5 MG HALF TABLET
12.5000 mg | ORAL_TABLET | Freq: Two times a day (BID) | ORAL | Status: DC
Start: 2015-02-23 — End: 2015-02-24
  Administered 2015-02-23 – 2015-02-24 (×3): 12.5 mg via ORAL
  Filled 2015-02-23 (×4): qty 1

## 2015-02-23 NOTE — Progress Notes (Signed)
[facility]  [address] [place], [place] [address] Phone: [phone] FAX: [place] [phone] 09/23/1925   Problem List:   Principal Problem:   Cancer of cecum  Active Problems:   Benign hypertensive heart disease without heart failure   Hx of CABG   S/P aortic valve replacement with bioprosthetic valve   Hypercholesterolemia   Osteoarthritis   Symptomatic anemia   CKD (chronic kidney disease) stage 3, GFR 30-59 ml/min   Essential hypertension   Chronic anticoagulation for mechanical heart valve   Cancer of ascending colon ([place])   4 Days Post-Op  02/19/2015  Procedure(s): XI ROBOT ASSISTED PROXIMAL RIGHT COLECTOMY  Assessment  Feeling better  Plan:   -Heart healthy diet -bowel regimen -retry lovenox & follow -HTN returning - inc metoprolol to 1/2 usual.  Continue to hold amlodipine -CKD with mild ARF - improving Cr & UOP - Follow s/p transfusion -f/u path -VTE prophylaxis- SCDs, etc -acute on chronic anemia from bleeding cancer - transfused.  Restarting iron -hold on Plavix until >48hrs of stable Hgb & has decent bowel fxn -mobilize as tolerated to help recovery.  PT seeing.   -Dispo - poss HH PT/OT vs short term SNF/Rehab.     D/C patient from hospital when patient meets criteria (anticipate in 1-2 day(s)):  Tolerating oral intake well Ambulating well Adequate pain control without IV medications Urinating  Having flatus Disposition planning in place   I updated the patient's status to the patient.  Recommendations were made.  Questions were answered.  They expressed understanding & appreciation.   [name], [name], F.A.C.S. Gastrointestinal and Minimally Invasive Surgery [facility] [address] [place], [place] [address] [phone] Main / Paging   02/23/2015  Subjective:  Walked in hallways w walker Tol soft diet but appetite  OK Worried about going to SNF vs home.  Family around but most live out of town.  Objective:  Vital signs:  Filed Vitals:   02/22/15 1113 02/22/15 1400 02/22/15 2000 02/23/15 0538  BP: 132/61 129/65  155/72  Pulse: 84 84  94  Temp:  98.2 F (36.8 C) 98.4 F (36.9 C) 97.7 F (36.5 C)  TempSrc:  Oral Oral Oral  Resp:  18  16  Height:      Weight:    75.8 kg (167 lb 1.7 oz)  SpO2:        Last BM Date: 02/22/15  Intake/Output   Yesterday:  12/10 0701 - 12/11 0700 In: 3 [I.V.:3] Out: 600 [Urine:600] This shift:     Bowel function:  Flatus: y  BM: y  Drain: n/a  Physical Exam:  General: Pt awake/alert/oriented x4 in no acute distress.  Pale Eyes: PERRL, normal EOM.  Sclera clear.  No icterus Neuro: CN II-XII intact w/o focal sensory/motor deficits. Lymph: No head/neck/groin lymphadenopathy Psych:  No delerium/psychosis/paranoia HENT: Normocephalic, Mucus membranes moist.  No thrush Neck: Supple, No tracheal deviation Chest: No chest wall pain w good excursion CV:  Pulses intact.  Regular rhythm MS: Normal AROM mjr joints.  No obvious deformity Abdomen: Soft.  Nondistended.  Mildly tender at incisions only.  No evidence of peritonitis.  No incarcerated hernias.  LLQ flank ecchymosis stable. Ext:  SCDs BLE.  No mjr edema.  No cyanosis Skin: No petechiae / purpura.    Results:   Labs: Results for orders placed or performed during the hospital encounter of 02/19/15 (from the past  48 hour(s))  Hemoglobin     Status: Abnormal   Collection Time: 02/22/15  5:24 AM  Result Value Ref Range   Hemoglobin 7.6 (L) 13.0 - 17.0 g/dL  Potassium     Status: None   Collection Time: 02/22/15  5:24 AM  Result Value Ref Range   Potassium 4.1 3.5 - 5.1 mmol/L  Creatinine, serum     Status: Abnormal   Collection Time: 02/22/15  5:24 AM  Result Value Ref Range   Creatinine, Ser 1.68 (H) 0.61 - 1.24 mg/dL   GFR calc non Af Amer 35 (L) >60 mL/min   GFR calc Af Amer 41 (L) >60  mL/min    Comment: (NOTE) The eGFR has been calculated using the CKD EPI equation. This calculation has not been validated in all clinical situations. eGFR's persistently <60 mL/min signify possible Chronic Kidney Disease.   Magnesium     Status: None   Collection Time: 02/22/15  5:24 AM  Result Value Ref Range   Magnesium 2.0 1.7 - 2.4 mg/dL  Hemoglobin     Status: Abnormal   Collection Time: 02/23/15  5:30 AM  Result Value Ref Range   Hemoglobin 8.1 (L) 13.0 - 17.0 g/dL  Potassium     Status: None   Collection Time: 02/23/15  5:30 AM  Result Value Ref Range   Potassium 4.0 3.5 - 5.1 mmol/L  Creatinine, serum     Status: Abnormal   Collection Time: 02/23/15  5:30 AM  Result Value Ref Range   Creatinine, Ser 1.47 (H) 0.61 - 1.24 mg/dL   GFR calc non Af Amer 41 (L) >60 mL/min   GFR calc Af Amer 48 (L) >60 mL/min    Comment: (NOTE) The eGFR has been calculated using the CKD EPI equation. This calculation has not been validated in all clinical situations. eGFR's persistently <60 mL/min signify possible Chronic Kidney Disease.     Imaging / Studies: No results found.  Medications / Allergies: per chart  Antibiotics: Anti-infectives    Start     Dose/Rate Route Frequency Ordered Stop   02/19/15 2200  cefoTEtan (CEFOTAN) 2 g in dextrose 5 % 50 mL IVPB     2 g 100 mL/hr over 30 Minutes Intravenous Every 12 hours 02/19/15 1733 02/19/15 2244   02/19/15 1452  clindamycin (CLEOCIN) 900 mg, gentamicin (GARAMYCIN) 240 mg in sodium chloride 0.9 % 1,000 mL for intraperitoneal lavage  Status:  Discontinued       As needed 02/19/15 1452 02/19/15 1535   02/19/15 1012  cefoTEtan (CEFOTAN) 2 g in dextrose 5 % 50 mL IVPB     2 g 100 mL/hr over 30 Minutes Intravenous On call to O.R. 02/19/15 1012 02/19/15 1230   02/19/15 0600  clindamycin (CLEOCIN) 900 mg, gentamicin (GARAMYCIN) 240 mg in sodium chloride 0.9 % 1,000 mL for intraperitoneal lavage  Status:  Discontinued      Intraperitoneal  To Surgery 02/18/15 1359 02/19/15 1713   02/18/15 1400  clindamycin (CLEOCIN) 900 mg, gentamicin (GARAMYCIN) 240 mg in sodium chloride 0.9 % 1,000 mL for intraperitoneal lavage  Status:  Discontinued    Comments:  Pharmacy may adjust dosing strength, schedule, rate of infusion, etc as needed to optimize therapy   1 application Intraperitoneal To Surgery 02/18/15 1357 02/18/15 1359        Note: Portions of this report may have been transcribed using voice recognition software. Every effort was made to ensure accuracy; however, inadvertent computerized transcription errors may be present.  Any transcriptional errors that result from this process are unintentional.     [name], [name], F.A.C.S. Gastrointestinal and Minimally Invasive Surgery [facility] [address], [place], [place] [address] [phone] Main / Paging   02/23/2015  CARE TEAM:  PCP: [name], MD  Outpatient Care Team: Patient Care Team: Seward Carol, MD as PCP - General (Internal Medicine) Darlin Coco, MD as Consulting Physician (Cardiology) Clarene Essex, MD as Consulting Physician (Gastroenterology) Michael Boston, MD as Consulting Physician (General Surgery)  Inpatient Treatment Team: Treatment Team: Attending Provider: Michael Boston, MD; Technician: Leda Quail, NT; Technician: Veleta Miners, NT; Technician: Sueanne Margarita, NT; Registered Nurse: Oleta Mouse, RN; Technician: Abbe Amsterdam, NT; Technician: Coralie Carpen, NT; Technician: Enis Gash, NT; Registered Nurse: Celedonio Savage, RN; Occupational Therapist: Peri Maris, OT; Respiratory Therapist: Delaney Meigs, RRT

## 2015-02-23 NOTE — NC FL2 (Signed)
[place] LEVEL OF CARE SCREENING TOOL     IDENTIFICATION  Patient Name: Michael Blanchard [facility] Birthdate: Aug 15, 1925 Sex: male Admission Date (Current Location): 02/19/2015  [facility] and [place] Number:  ([place])   Facility and Address:  [facility],  [place] [address], [place]      Provider Number: [id number]  Attending Physician Name and Address:  Michael Boston, MD  Relative Name and Phone Number:       Current Level of Care: Hospital Recommended Level of Care: [place] Prior Approval Number:    Date Approved/Denied:   PASRR Number: [id number]  Discharge Plan: SNF    Current Diagnoses: Patient Active Problem List   Diagnosis Date Noted  . Chronic anticoagulation for mechanical heart valve 02/19/2015  . Cancer of ascending colon ([place]) 02/19/2015  . Cancer of cecum    . Hypochromic microcytic anemia   . CKD (chronic kidney disease) stage 3, GFR 30-59 ml/min 01/08/2015  . Essential hypertension 01/08/2015  . Symptomatic anemia 01/07/2015  . Anemia 01/07/2015  . Trigeminal neuralgia 03/21/2013  . Malaise and fatigue 01/25/2011  . Benign hypertensive heart disease without heart failure 09/24/2010  . Hx of CABG 09/24/2010  . S/P aortic valve replacement with bioprosthetic valve 09/24/2010  . Hypercholesterolemia 09/24/2010  . Osteoarthritis 09/24/2010    Orientation RESPIRATION BLADDER Height & Weight    Self, Time, Situation, Place    Continent 5\' 8"  (172.7 cm) 167 lbs.  BEHAVIORAL SYMPTOMS/MOOD NEUROLOGICAL BOWEL NUTRITION STATUS      Continent Diet (heart healthy fluid consistency thin)  AMBULATORY STATUS COMMUNICATION OF NEEDS Skin   Limited Assist Verbally Surgical wounds                       Personal Care Assistance Level of Assistance  Dressing, Bathing Bathing Assistance: Limited assistance   Dressing Assistance: Limited assistance     Functional Limitations Info  Sight, Hearing Sight Info:  Impaired Hearing Info: Impaired      SPECIAL CARE FACTORS FREQUENCY                       Contractures      Additional Factors Info  Allergies, Code Status Code Status Info: full code Allergies Info: Gabapentin, Lipitor, Niaspan           Current Medications (02/23/2015):  This is the current hospital active medication list Current Facility-Administered Medications  Medication Dose Route Frequency Provider Last Rate Last Dose  . 0.9 %  sodium chloride infusion  250 mL Intravenous PRN Michael Boston, MD      . acetaminophen (TYLENOL) tablet 500 mg  500 mg Oral TID WC & HS Michael Boston, MD   500 mg at 02/23/15 1152  . alum & mag hydroxide-simeth (MAALOX/MYLANTA) 200-200-20 MG/5ML suspension 30 mL  30 mL Oral Q6H PRN Michael Boston, MD   30 mL at 02/20/15 1811  . carbamazepine (TEGRETOL) tablet 200 mg  200 mg Oral BID Michael Boston, MD   200 mg at 02/23/15 1015  . diphenhydrAMINE (BENADRYL) 12.5 MG/5ML elixir 12.5 mg  12.5 mg Oral Q6H PRN Michael Boston, MD       Or  . diphenhydrAMINE (BENADRYL) injection 12.5 mg  12.5 mg Intravenous Q6H PRN Michael Boston, MD      . donepezil (ARICEPT) tablet 10 mg  10 mg Oral QHS Michael Boston, MD   10 mg at 02/22/15 2220  . enoxaparin (LOVENOX) injection 30  mg  30 mg Subcutaneous Q24H Michael Boston, MD   30 mg at 02/23/15 1016  . feeding supplement (ENSURE ENLIVE) (ENSURE ENLIVE) liquid 237 mL  237 mL Oral BID BM Michael Boston, MD   237 mL at 02/21/15 1400  . ferrous sulfate tablet 325 mg  325 mg Oral BID WC Michael Boston, MD   325 mg at 02/23/15 0851  . HYDROcodone-acetaminophen (NORCO) 7.5-325 MG per tablet 1 tablet  1 tablet Oral Q4H PRN Michael Boston, MD      . HYDROmorphone (DILAUDID) injection 0.5-1 mg  0.5-1 mg Intravenous Q2H PRN Michael Boston, MD   0.5 mg at 02/22/15 2220  . lactated ringers bolus 1,000 mL  1,000 mL Intravenous Q8H PRN Michael Boston, MD      . lactose free nutrition (BOOST PLUS) liquid 237 mL  237 mL Oral TID WC Michael Boston, MD       . lip balm (CARMEX) ointment 1 application  1 application Topical BID Michael Boston, MD   1 application at [id number]  . magic mouthwash  15 mL Oral QID PRN Michael Boston, MD      . menthol-cetylpyridinium (CEPACOL) lozenge 3 mg  1 lozenge Oral PRN Michael Boston, MD      . metoprolol (LOPRESSOR) injection 5 mg  5 mg Intravenous Q6H PRN Michael Boston, MD   5 mg at 02/19/15 2000  . metoprolol tartrate (LOPRESSOR) tablet 12.5 mg  12.5 mg Oral BID Michael Boston, MD   12.5 mg at 02/23/15 1015  . multivitamin with minerals tablet 1 tablet  1 tablet Oral Daily Michael Boston, MD   1 tablet at 02/23/15 1015  . nitroGLYCERIN (NITROSTAT) SL tablet 0.4 mg  0.4 mg Sublingual Q5 min PRN Michael Boston, MD      . ondansetron [facility]) tablet 4 mg  4 mg Oral Q6H PRN Michael Boston, MD       Or  . ondansetron [facility]) injection 4 mg  4 mg Intravenous Q6H PRN Michael Boston, MD   4 mg at 02/21/15 1230  . phenol (CHLORASEPTIC) mouth spray 2 spray  2 spray Mouth/Throat PRN Michael Boston, MD      . promethazine (PHENERGAN) injection 6.25-12.5 mg  6.25-12.5 mg Intravenous Q4H PRN Michael Boston, MD   12.5 mg at 02/19/15 1930  . [place]   Oral PRN Michael Boston, MD      . rosuvastatin (CRESTOR) tablet 5 mg  5 mg Oral Once per day on Tue Thu Sat Michael Boston, MD   5 mg at 02/22/15 1120  . saccharomyces boulardii (FLORASTOR) capsule 250 mg  250 mg Oral BID Michael Boston, MD   250 mg at 02/23/15 1015  . sodium chloride 0.9 % injection 3 mL  3 mL Intravenous Q12H Michael Boston, MD   3 mL at 02/23/15 1016  . sodium chloride 0.9 % injection 3 mL  3 mL Intravenous PRN Michael Boston, MD      . tamsulosin [facility]) capsule 0.4 mg  0.4 mg Oral Q lunch Michael Boston, MD   0.4 mg at 02/23/15 1152  . zolpidem (AMBIEN) tablet 5 mg  5 mg Oral QHS PRN Michael Boston, MD         Discharge Medications: Please see discharge summary for a list of discharge medications.  Relevant Imaging Results:  Relevant Lab  Results:   Additional Information    Carlean Jews, LCSW

## 2015-02-23 NOTE — Clinical Social Work Note (Signed)
Clinical Social Work Assessment  Patient Details  Name: Michael Blanchard MRN: [id number] Date of Birth: May 13, 1925  Date of referral:  02/23/15               Reason for consult:  Facility Placement                Permission sought to share information with:  Facility Sport and exercise psychologist, Family Supports Permission granted to share information::  Yes, Verbal Permission Granted  Name::        Agency::     Relationship::     Contact Information:     Housing/Transportation Living arrangements for the past 2 months:  Single Family Home Source of Information:  Patient, Adult Children Patient Interpreter Needed:  None Criminal Activity/Legal Involvement Pertinent to Current Situation/Hospitalization:    Significant Relationships:  Adult Children, Spouse Lives with:  Spouse Do you feel safe going back to the place where you live?    Need for family participation in patient care:  Yes (Comment)  Care giving concerns:  No caregiver   Social Worker assessment / plan:  CSW met with pt and his children at bedside to discuss discharge plans.  CSW provided information regarding SNF process and prompted pt and family to discuss needs, explore thoughts about rehab and talk about feelings related to rehab and diagnoses.  Pt has a wife at home that he cares for, she is blind and was admitted to hospital today for kidney stones and kidney infections.  CSW sent pt information to [facility] as pt stated this SNF is 5 minutes from his home.   Employment status:  Retired Forensic scientist:  Medicare, Other (Comment Required) (blue cross supplement) PT Recommendations:  24 Hour Supervision Information / Referral to community resources:     Patient/Family's Response to care:  Pt discussed living with his wife and caring for her.  [name] is blind.  [name] Michael Blanchard comes daily to help in home regarding meals, helping with pt's spouse cleaning etc.  Pt stated that he is open to [facility] for rehab.   He is also worried about his wife is was admitted today at hospital for her own health reasons.  Patient/Family's Understanding of and Emotional Response to Diagnosis, Current Treatment, and Prognosis:  Pt understands that he will need rehab to build his strength so that he could continue to care for his wife  Emotional Assessment Appearance:  Appears younger than stated age Attitude/Demeanor/Rapport:   (pleasant) Affect (typically observed):  Accepting Orientation:  Oriented to Self, Oriented to Place, Oriented to  Time, Oriented to Situation Alcohol / Substance use:    Psych involvement (Current and /or in the community):  No (Comment)  Discharge Needs  Concerns to be addressed:    Readmission within the last 30 days:    Current discharge risk:    Barriers to Discharge:  No Barriers Identified   Michael Jews, LCSW 02/23/2015, 4:24 PM

## 2015-02-24 DIAGNOSIS — R2689 Other abnormalities of gait and mobility: Secondary | ICD-10-CM | POA: Diagnosis not present

## 2015-02-24 DIAGNOSIS — Z951 Presence of aortocoronary bypass graft: Secondary | ICD-10-CM | POA: Diagnosis not present

## 2015-02-24 DIAGNOSIS — I509 Heart failure, unspecified: Secondary | ICD-10-CM | POA: Diagnosis not present

## 2015-02-24 DIAGNOSIS — C180 Malignant neoplasm of cecum: Secondary | ICD-10-CM | POA: Diagnosis not present

## 2015-02-24 DIAGNOSIS — F0390 Unspecified dementia without behavioral disturbance: Secondary | ICD-10-CM | POA: Diagnosis not present

## 2015-02-24 DIAGNOSIS — E7800 Pure hypercholesterolemia, unspecified: Secondary | ICD-10-CM | POA: Diagnosis not present

## 2015-02-24 DIAGNOSIS — Z954 Presence of other heart-valve replacement: Secondary | ICD-10-CM | POA: Diagnosis not present

## 2015-02-24 DIAGNOSIS — Z483 Aftercare following surgery for neoplasm: Secondary | ICD-10-CM | POA: Diagnosis not present

## 2015-02-24 DIAGNOSIS — N183 Chronic kidney disease, stage 3 (moderate): Secondary | ICD-10-CM | POA: Diagnosis not present

## 2015-02-24 DIAGNOSIS — I209 Angina pectoris, unspecified: Secondary | ICD-10-CM | POA: Diagnosis not present

## 2015-02-24 DIAGNOSIS — D649 Anemia, unspecified: Secondary | ICD-10-CM | POA: Diagnosis not present

## 2015-02-24 DIAGNOSIS — M1990 Unspecified osteoarthritis, unspecified site: Secondary | ICD-10-CM | POA: Diagnosis not present

## 2015-02-24 DIAGNOSIS — G8929 Other chronic pain: Secondary | ICD-10-CM | POA: Diagnosis not present

## 2015-02-24 DIAGNOSIS — K5900 Constipation, unspecified: Secondary | ICD-10-CM | POA: Diagnosis not present

## 2015-02-24 DIAGNOSIS — I10 Essential (primary) hypertension: Secondary | ICD-10-CM | POA: Diagnosis not present

## 2015-02-24 DIAGNOSIS — R600 Localized edema: Secondary | ICD-10-CM | POA: Diagnosis not present

## 2015-02-24 DIAGNOSIS — I2510 Atherosclerotic heart disease of native coronary artery without angina pectoris: Secondary | ICD-10-CM | POA: Diagnosis not present

## 2015-02-24 DIAGNOSIS — G500 Trigeminal neuralgia: Secondary | ICD-10-CM | POA: Diagnosis not present

## 2015-02-24 DIAGNOSIS — R05 Cough: Secondary | ICD-10-CM | POA: Diagnosis not present

## 2015-02-24 DIAGNOSIS — E785 Hyperlipidemia, unspecified: Secondary | ICD-10-CM | POA: Diagnosis not present

## 2015-02-24 DIAGNOSIS — M6218 Other rupture of muscle (nontraumatic), other site: Secondary | ICD-10-CM | POA: Diagnosis not present

## 2015-02-24 DIAGNOSIS — C801 Malignant (primary) neoplasm, unspecified: Secondary | ICD-10-CM | POA: Diagnosis not present

## 2015-02-24 DIAGNOSIS — R278 Other lack of coordination: Secondary | ICD-10-CM | POA: Diagnosis not present

## 2015-02-24 LAB — POTASSIUM: POTASSIUM: 4 mmol/L (ref 3.5–5.1)

## 2015-02-24 LAB — CREATININE, SERUM
Creatinine, Ser: 1.35 mg/dL — ABNORMAL HIGH (ref 0.61–1.24)
GFR calc non Af Amer: 46 mL/min — ABNORMAL LOW (ref >60)
GFR, EST AFRICAN AMERICAN: 53 mL/min — AB (ref >60)

## 2015-02-24 LAB — HEMOGLOBIN: HEMOGLOBIN: 8.4 g/dL — AB (ref 13.0–17.0)

## 2015-02-24 NOTE — Progress Notes (Signed)
Patient for d/c today to SNF bed at [facility] today. Daughter and patient agreeable to this plan- plan transfer via EMS. Eduard Clos, MSW, [place]

## 2015-02-24 NOTE — Discharge Summary (Signed)
Physician Discharge Summary  Patient ID: Michael Blanchard MRN: [id number] DOB/AGE: 05/05/1925 81 y.o.  Admit date: 02/19/2015 Discharge date: 02/24/2015  Patient Care Team: Seward Carol, MD as PCP - General (Internal Medicine) Darlin Coco, MD as Consulting Physician (Cardiology) Clarene Essex, MD as Consulting Physician (Gastroenterology) Michael Boston, MD as Consulting Physician (General Surgery)  Admission Diagnoses: Principal Problem:   Cancer of cecum  Active Problems:   Benign hypertensive heart disease without heart failure   Hx of CABG   S/P aortic valve replacement with bioprosthetic valve   Hypercholesterolemia   Osteoarthritis   Symptomatic anemia   CKD (chronic kidney disease) stage 3, GFR 30-59 ml/min   Essential hypertension   Chronic anticoagulation for mechanical heart valve   Cancer of ascending colon [facility])   Discharge Diagnoses:  Principal Problem:   Cancer of cecum  Active Problems:   Benign hypertensive heart disease without heart failure   Hx of CABG   S/P aortic valve replacement with bioprosthetic valve   Hypercholesterolemia   Osteoarthritis   Symptomatic anemia   CKD (chronic kidney disease) stage 3, GFR 30-59 ml/min   Essential hypertension   Chronic anticoagulation for mechanical heart valve   Cancer of ascending colon ([place])   POST-OPERATIVE DIAGNOSIS:   cecal cancer  SURGERY:  02/19/2015  Procedure(s): XI ROBOT ASSISTED PROXIMAL RIGHT COLECTOMY  SURGEON:    Surgeon(s): Michael Boston, MD Leighton Ruff, MD  Consults: None  Hospital Course:   The patient underwent the surgery above.  Postoperatively, the patient gradually mobilized and advanced to a solid diet.  Pain and other symptoms were treated aggressively.   Mildy hypotensive with symptomatic anemia - transfused 2 U PRBCs & felt better.  Hgb stable on resumption of enoxaparin.  By the time of discharge, the patient was walking with help in the hallways, eating food, having  flatus & BMs.  Pain was well-controlled on an oral medications.  Based on meeting discharge criteria and continuing to recover, I felt it was safe for the patient to be discharged from the hospital to further recover with close followup. SNF with rehab to help with strength & balance Postoperative recommendations were discussed in detail.  They are written as well.   Significant Diagnostic Studies:  Results for orders placed or performed during the hospital encounter of 02/19/15 (from the past 72 hour(s))  Hemoglobin     Status: Abnormal   Collection Time: 02/22/15  5:24 AM  Result Value Ref Range   Hemoglobin 7.6 (L) 13.0 - 17.0 g/dL  Potassium     Status: None   Collection Time: 02/22/15  5:24 AM  Result Value Ref Range   Potassium 4.1 3.5 - 5.1 mmol/L  Creatinine, serum     Status: Abnormal   Collection Time: 02/22/15  5:24 AM  Result Value Ref Range   Creatinine, Ser 1.68 (H) 0.61 - 1.24 mg/dL   GFR calc non Af Amer 35 (L) >60 mL/min   GFR calc Af Amer 41 (L) >60 mL/min    Comment: (NOTE) The eGFR has been calculated using the CKD EPI equation. This calculation has not been validated in all clinical situations. eGFR's persistently <60 mL/min signify possible Chronic Kidney Disease.   Magnesium     Status: None   Collection Time: 02/22/15  5:24 AM  Result Value Ref Range   Magnesium 2.0 1.7 - 2.4 mg/dL  Hemoglobin     Status: Abnormal   Collection Time: 02/23/15  5:30 AM  Result Value Ref Range  Hemoglobin 8.1 (L) 13.0 - 17.0 g/dL  Potassium     Status: None   Collection Time: 02/23/15  5:30 AM  Result Value Ref Range   Potassium 4.0 3.5 - 5.1 mmol/L  Creatinine, serum     Status: Abnormal   Collection Time: 02/23/15  5:30 AM  Result Value Ref Range   Creatinine, Ser 1.47 (H) 0.61 - 1.24 mg/dL   GFR calc non Af Amer 41 (L) >60 mL/min   GFR calc Af Amer 48 (L) >60 mL/min    Comment: (NOTE) The eGFR has been calculated using the CKD EPI equation. This calculation has  not been validated in all clinical situations. eGFR's persistently <60 mL/min signify possible Chronic Kidney Disease.   Hemoglobin     Status: Abnormal   Collection Time: 02/24/15  4:40 AM  Result Value Ref Range   Hemoglobin 8.4 (L) 13.0 - 17.0 g/dL  Potassium     Status: None   Collection Time: 02/24/15  4:40 AM  Result Value Ref Range   Potassium 4.0 3.5 - 5.1 mmol/L  Creatinine, serum     Status: Abnormal   Collection Time: 02/24/15  4:40 AM  Result Value Ref Range   Creatinine, Ser 1.35 (H) 0.61 - 1.24 mg/dL   GFR calc non Af Amer 46 (L) >60 mL/min   GFR calc Af Amer 53 (L) >60 mL/min    Comment: (NOTE) The eGFR has been calculated using the CKD EPI equation. This calculation has not been validated in all clinical situations. eGFR's persistently <60 mL/min signify possible Chronic Kidney Disease.     No results found.  Discharge Exam: Blood pressure 155/62, pulse 94, temperature 98 F (36.7 C), temperature source Oral, resp. rate 18, height '5\' 8"'  (1.727 m), weight 75.8 kg (167 lb 1.7 oz), SpO2 95 %.  General: Pt awake/alert/oriented x4 in no acute distress. Pale Eyes: PERRL, normal EOM. Sclera clear. No icterus Neuro: CN II-XII intact w/o focal sensory/motor deficits. Lymph: No head/neck/groin lymphadenopathy Psych: No delerium/psychosis/paranoia HENT: Normocephalic, Mucus membranes moist. No thrush Neck: Supple, No tracheal deviation Chest: No chest wall pain w good excursion CV: Pulses intact. Regular rhythm MS: Normal AROM mjr joints. No obvious deformity Abdomen: Soft. Nondistended. Mildly tender at incisions only. No evidence of peritonitis. No incarcerated hernias. LLQ flank ecchymosis stable. Ext: SCDs BLE. No mjr edema. No cyanosis Skin: No petechiae / purpura.   Discharged Condition: stable but improving   Past Medical History  Diagnosis Date  . Hyperlipidemia   . Hypertension   . AVD (aortic valve disease)   . Coronary  atherosclerosis   . Fatigue   . Complication of anesthesia     family states pt memory was bad 3-4 weeks after surgery  . Chronic kidney disease     stage 3 kidney disease  . Trigeminal neuralgia   . Arthritis     bil knees, right shoulder  . Stroke [facility])     "I had a very milk sstroke one time the doctor said", current mini strokes    Past Surgical History  Procedure Laterality Date  . Cardiac catheterization  06/22/1999    SEVERE HYPOKINESIA OF THE MID TO DISTAL INFERIOR WALL AND APEX.MODERATE LEFT VENTRICULAR DYSFUNCTION WITH EF 45%  . Aortic valve replacement      C[place]ent[place]-Edwards Pericardial Valve, MR Safe 1.5T  . Coronary artery bypass graft  2001  . Esophagogastroduodenoscopy N/A 01/10/2015    Procedure: ESOPHAGOGASTRODUODENOSCOPY (EGD);  Surgeon: Clarene Essex, MD;  Location: [facility] ENDOSCOPY;  Service: Endoscopy;  Laterality: N/A;  . Colonoscopy N/A 01/12/2015    Procedure: COLONOSCOPY;  Surgeon: Wilford Corner, MD;  Location: [facility] ENDOSCOPY;  Service: Endoscopy;  Laterality: N/A;    Social History   Social History  . Marital Status: Married    Spouse Name: Murray Hodgkins  . Number of Children: 4  . Years of Education: 12   Occupational History  .      retired   Social History Main Topics  . Smoking status: Former Smoker    Quit date: 09/13/1985  . Smokeless tobacco: Never Used  . Alcohol Use: No  . Drug Use: No  . Sexual Activity: Not on file   Other Topics Concern  . Not on file   Social History Narrative   Pt lives at home with his spouse. [name]) [name]   Caffeine Use: - Two cups of coffee daily and one pepsi daily.   Right handed.   Education- High school.    Family History  Problem Relation Age of Onset  . Stroke Mother   . Aneurysm Mother   . Aneurysm Father   . Heart disease Brother     Current Facility-Administered Medications  Medication Dose Route Frequency Provider Last Rate Last Dose  . 0.9 %  sodium chloride infusion  250 mL Intravenous PRN  Michael Boston, MD      . acetaminophen (TYLENOL) tablet 500 mg  500 mg Oral TID WC & HS Michael Boston, MD   500 mg at 02/23/15 2117  . alum & mag hydroxide-simeth (MAALOX/MYLANTA) 200-200-20 MG/5ML suspension 30 mL  30 mL Oral Q6H PRN Michael Boston, MD   30 mL at 02/20/15 1811  . carbamazepine (TEGRETOL) tablet 200 mg  200 mg Oral BID Michael Boston, MD   200 mg at 02/23/15 2117  . diphenhydrAMINE (BENADRYL) 12.5 MG/5ML elixir 12.5 mg  12.5 mg Oral Q6H PRN Michael Boston, MD       Or  . diphenhydrAMINE (BENADRYL) injection 12.5 mg  12.5 mg Intravenous Q6H PRN Michael Boston, MD      . donepezil (ARICEPT) tablet 10 mg  10 mg Oral QHS Michael Boston, MD   10 mg at 02/23/15 2117  . enoxaparin (LOVENOX) injection 30 mg  30 mg Subcutaneous Q24H Michael Boston, MD   30 mg at 02/23/15 1016  . feeding supplement (ENSURE ENLIVE) (ENSURE ENLIVE) liquid 237 mL  237 mL Oral BID BM Michael Boston, MD   237 mL at 02/21/15 1400  . ferrous sulfate tablet 325 mg  325 mg Oral BID WC Michael Boston, MD   325 mg at 02/23/15 1630  . HYDROcodone-acetaminophen (NORCO) 7.5-325 MG per tablet 1 tablet  1 tablet Oral Q4H PRN Michael Boston, MD   1 tablet at 02/23/15 2015  . HYDROmorphone (DILAUDID) injection 0.5-1 mg  0.5-1 mg Intravenous Q2H PRN Michael Boston, MD   0.5 mg at 02/22/15 2220  . lactated ringers bolus 1,000 mL  1,000 mL Intravenous Q8H PRN Michael Boston, MD      . lactose free nutrition (BOOST PLUS) liquid 237 mL  237 mL Oral TID WC Michael Boston, MD   237 mL at 02/23/15 1630  . lip balm (CARMEX) ointment 1 application  1 application Topical BID Michael Boston, MD   1 application at 28/00/34 1000  . magic mouthwash  15 mL Oral QID PRN Michael Boston, MD      . menthol-cetylpyridinium (CEPACOL) lozenge 3 mg  1 lozenge Oral PRN Michael Boston, MD      .  metoprolol (LOPRESSOR) injection 5 mg  5 mg Intravenous Q6H PRN Michael Boston, MD   5 mg at 02/19/15 2000  . metoprolol tartrate (LOPRESSOR) tablet 12.5 mg  12.5 mg Oral BID Michael Boston, MD    12.5 mg at 02/23/15 2116  . multivitamin with minerals tablet 1 tablet  1 tablet Oral Daily Michael Boston, MD   1 tablet at 02/23/15 1015  . nitroGLYCERIN (NITROSTAT) SL tablet 0.4 mg  0.4 mg Sublingual Q5 min PRN Michael Boston, MD      . ondansetron [facility]) tablet 4 mg  4 mg Oral Q6H PRN Michael Boston, MD       Or  . ondansetron [facility]) injection 4 mg  4 mg Intravenous Q6H PRN Michael Boston, MD   4 mg at 02/21/15 1230  . phenol (CHLORASEPTIC) mouth spray 2 spray  2 spray Mouth/Throat PRN Michael Boston, MD      . promethazine (PHENERGAN) injection 6.25-12.5 mg  6.25-12.5 mg Intravenous Q4H PRN Michael Boston, MD   12.5 mg at 02/19/15 1930  . [place]   Oral PRN Michael Boston, MD      . rosuvastatin (CRESTOR) tablet 5 mg  5 mg Oral Once per day on Tue Thu Sat Michael Boston, MD   5 mg at 02/22/15 1120  . saccharomyces boulardii (FLORASTOR) capsule 250 mg  250 mg Oral BID Michael Boston, MD   250 mg at 02/23/15 2118  . sodium chloride 0.9 % injection 3 mL  3 mL Intravenous Q12H Michael Boston, MD   3 mL at 02/23/15 2118  . sodium chloride 0.9 % injection 3 mL  3 mL Intravenous PRN Michael Boston, MD      . tamsulosin [facility]) capsule 0.4 mg  0.4 mg Oral Q lunch Michael Boston, MD   0.4 mg at 02/23/15 1152  . zolpidem (AMBIEN) tablet 5 mg  5 mg Oral QHS PRN Michael Boston, MD         Allergies  Allergen Reactions  . Gabapentin     Unknown rxn  . Lipitor [Atorvastatin Calcium]     Unknown rxn  . Niaspan [Niacin]     Unknown rxn    Disposition: 01-Home or Self Care  Discharge Instructions    Call MD for:  extreme fatigue    Complete by:  As directed      Call MD for:  extreme fatigue    Complete by:  As directed      Call MD for:  hives    Complete by:  As directed      Call MD for:  hives    Complete by:  As directed      Call MD for:  persistant nausea and vomiting    Complete by:  As directed      Call MD for:  persistant nausea and vomiting    Complete by:  As directed       Call MD for:  redness, tenderness, or signs of infection (pain, swelling, redness, odor or green/yellow discharge around incision site)    Complete by:  As directed      Call MD for:  redness, tenderness, or signs of infection (pain, swelling, redness, odor or green/yellow discharge around incision site)    Complete by:  As directed      Call MD for:  severe uncontrolled pain    Complete by:  As directed      Call MD for:  severe uncontrolled pain    Complete  by:  As directed      Call MD for:    Complete by:  As directed   Temperature > 101.54F     Call MD for:    Complete by:  As directed   Temperature > 101.54F     Diet - low sodium heart healthy    Complete by:  As directed      Diet - low sodium heart healthy    Complete by:  As directed      Discharge instructions    Complete by:  As directed   Please see discharge instruction sheets.  Also refer to handout given an office.  Please call our office if you have any questions or concerns [phone]     Discharge instructions    Complete by:  As directed   Please see discharge instruction sheets.  Also refer to handout given an office.  Please call our office if you have any questions or concerns [phone]     Discharge wound care:    Complete by:  As directed   If you have closed incisions, shower and bathe over these incisions with soap and water every day.  Remove all surgical dressings on postoperative day #3.  You do not need to replace dressings over the closed incisions unless you feel more comfortable with a Band-Aid covering it.   If you have an open wound that requires packing, please see wound care instructions.  In general, remove all dressings, wash wound with soap and water and then replace with saline moistened gauze.  Do the dressing change at least every day.  Please call our office [phone] if you have further questions.     Discharge wound care:    Complete by:  As directed   If you have closed incisions,  shower and bathe over these incisions with soap and water every day.  Remove all surgical dressings on postoperative day #3.  You do not need to replace dressings over the closed incisions unless you feel more comfortable with a Band-Aid covering it.   If you have an open wound that requires packing, please see wound care instructions.  In general, remove all dressings, wash wound with soap and water and then replace with saline moistened gauze.  Do the dressing change at least every day.  Please call our office [phone] if you have further questions.     Driving Restrictions    Complete by:  As directed   No driving until off narcotics and can safely swerve away without pain during an emergency     Driving Restrictions    Complete by:  As directed   No driving until off narcotics and can safely swerve away without pain during an emergency     Increase activity slowly    Complete by:  As directed   Walk an hour a day.  Use 20-30 minute walks.  When you can walk 30 minutes without difficulty, it is fine to restart low impact/moderate activities such as biking, jogging, swimming, sexual activity, etc.  Eventually you can increase to unrestricted activity when not feeling pain.  If you feel pain: STOP!Marland Kitchen   Let pain protect you from overdoing it.  Use ice/heat & over-the-counter pain medications to help minimize soreness.  If that is not enough, then use your narcotic pain prescription as needed to remain active.  It is better to take extra pain medications and be more active than to stay bedridden to avoid all pain  medications.     Increase activity slowly    Complete by:  As directed   Walk an hour a day.  Use 20-30 minute walks.  When you can walk 30 minutes without difficulty, it is fine to restart low impact/moderate activities such as biking, jogging, swimming, sexual activity, etc.  Eventually you can increase to unrestricted activity when not feeling pain.  If you feel pain: STOP!Marland Kitchen   Let pain  protect you from overdoing it.  Use ice/heat & over-the-counter pain medications to help minimize soreness.  If that is not enough, then use your narcotic pain prescription as needed to remain active.  It is better to take extra pain medications and be more active than to stay bedridden to avoid all pain medications.     Lifting restrictions    Complete by:  As directed   Avoid heavy lifting initially.  Do not push through pain.  You have no specific weight limit - if it hurts to do, DON'T DO IT.   If you feel no pain, you are not injuring anything.  Pain will protect you from injury.  Coughing and sneezing are far more stressful to your incision than any lifting.  Avoid resuming heavy lifting / intense activity until off all narcotic pain medications.  When ready to exercise more, give yourself 2 weeks to gradually get back to full intense exercise/activity.     Lifting restrictions    Complete by:  As directed   Avoid heavy lifting initially.  Do not push through pain.  You have no specific weight limit - if it hurts to do, DON'T DO IT.   If you feel no pain, you are not injuring anything.  Pain will protect you from injury.  Coughing and sneezing are far more stressful to your incision than any lifting.  Avoid resuming heavy lifting / intense activity until off all narcotic pain medications.  When ready to exercise more, give yourself 2 weeks to gradually get back to full intense exercise/activity.     May shower / Bathe    Complete by:  As directed      May shower / Bathe    Complete by:  As directed      May walk up steps    Complete by:  As directed      May walk up steps    Complete by:  As directed      Sexual Activity Restrictions    Complete by:  As directed   Sexual activity as tolerated.  Do not push through pain.  Pain will protect you from injury.     Sexual Activity Restrictions    Complete by:  As directed   Sexual activity as tolerated.  Do not push through pain.  Pain will  protect you from injury.     Walk with assistance    Complete by:  As directed   Walk over an hour a day.  May use a walker/cane/companion to help with balance and stamina.     Walk with assistance    Complete by:  As directed   Walk over an hour a day.  May use a walker/cane/companion to help with balance and stamina.            Medication List    STOP taking these medications        metroNIDAZOLE 500 MG tablet  Commonly known as:  FLAGYL     neomycin 500 MG tablet  Commonly known as:  MYCIFRADIN     oxyCODONE-acetaminophen 5-325 MG tablet  Commonly known as:  PERCOCET/ROXICET      TAKE these medications        amLODipine 2.5 MG tablet  Commonly known as:  NORVASC  TAKE 1 BY MOUTH DAILY     CALCIUM 600 + D PO  Take 1 tablet by mouth at bedtime.     carbamazepine 200 MG tablet  Commonly known as:  TEGRETOL  Take 1 tablet (200 mg total) by mouth 2 (two) times daily.     clopidogrel 75 MG tablet  Commonly known as:  PLAVIX  Take 1 tablet (75 mg total) by mouth daily.     CRANBERRY PO  Take 4,200 mg by mouth 2 (two) times daily.     donepezil 10 MG tablet  Commonly known as:  ARICEPT  TAKE 1 BY MOUTH AT BEDTIME     ferrous sulfate 325 (65 FE) MG tablet  Take 325 mg by mouth 3 (three) times daily with meals.     HYDROcodone-acetaminophen 7.5-325 MG tablet  Commonly known as:  NORCO  Take 1 tablet by mouth every 4 (four) hours as needed for moderate pain or severe pain (pain).     losartan-hydrochlorothiazide 100-25 MG tablet  Commonly known as:  HYZAAR  Take 1 tablet by mouth daily.     metoprolol 50 MG tablet  Commonly known as:  LOPRESSOR  TAKE 1 BY MOUTH TWICE DAILY     multivitamin tablet  Take 1 tablet by mouth daily.     nitroGLYCERIN 0.4 MG SL tablet  Commonly known as:  NITROSTAT  Place 1 tablet (0.4 mg total) under the tongue every 5 (five) minutes as needed for chest pain.     OSTEO BI-FLEX ADV DOUBLE ST PO  Take 1 tablet by mouth 2 (two)  times daily.     polyethylene glycol packet  Commonly known as:  MIRALAX / GLYCOLAX  Take 17 g by mouth daily.     rosuvastatin 10 MG tablet  Commonly known as:  CRESTOR  Take 5 mg by mouth See admin instructions. Take 3 times a week on Tuesdays, Thursdays, Saturdays     senna-docusate 8.6-50 MG tablet  Commonly known as:  Senokot-S  Take 1 tablet by mouth at bedtime as needed for moderate constipation. Take one tablet daily at bedtime and on tablet with each pain pill     tamsulosin 0.4 MG Caps capsule  Commonly known as:  FLOMAX  Take 0.4 mg by mouth daily with lunch.           Follow-up Information    Follow up with Sharel Behne C., MD. Schedule an appointment as soon as possible for a visit in 2 weeks.   Specialty:  General Surgery   Why:  To follow up after your operation, To follow up after your hospital stay   Contact information:   [place] [address] [phone]        Signed: Morton Peters, [name], F.A.C.S. Gastrointestinal and Minimally Invasive Surgery [facility] [address] [place], [place] [address] [phone] Main / Paging   02/24/2015, 7:10 AM

## 2015-02-24 NOTE — Progress Notes (Signed)
Occupational Therapy Treatment Patient Details Name: Michael Blanchard MRN: [id number] DOB: 1925/04/08 Today's Date: 02/24/2015    History of present illness 81 year old male s/p colectomy d/t colorectal cancer, PMH significant for HTN, HLD, AVD, CKD stage 3, stroke, arthritis, and fatigue.    OT comments  Plan to to go [facility]  Follow Up Recommendations  Home health OT;Supervision/Assistance - 24 hour    Equipment Recommendations  3 in 1 bedside comode    Recommendations for Other Services      Precautions / Restrictions Precautions Precautions: Fall Restrictions Weight Bearing Restrictions: No       Mobility Bed Mobility Overal bed mobility: Needs Assistance Bed Mobility: Supine to Sit;Sit to Supine     Supine to sit: Min assist;HOB elevated        Transfers Overall transfer level: Needs assistance Equipment used: Rolling walker (2 wheeled)   Sit to Stand: Min assist        Lateral/Scoot Transfers: Min guard          ADL Overall ADL's : Needs assistance/impaired     Grooming: Standing;Supervision/safety                       Toileting- Water quality scientist and Hygiene: Min guard;Sit to/from stand;Cueing for sequencing;Cueing for safety                          Cognition   Behavior During Therapy: [facility] for tasks assessed/performed Overall Cognitive Status: Within Functional Limits for tasks assessed                                    Pertinent Vitals/ Pain       Pain Assessment: No/denies pain         Frequency Min 2X/week     Progress Toward Goals  OT Goals(current goals can now be found in the care plan section)  Progress towards OT goals: Progressing toward goals            End of Session     Activity Tolerance Patient tolerated treatment well   Patient Left in chair   Nurse Communication Mobility status        Time: 0950-1020 OT Time Calculation (min): 30 min  Charges: OT General  Charges $OT Visit: 1 Procedure OT Treatments $Self Care/Home Management : 23-37 mins  Oline Belk, Thereasa Parkin 02/24/2015, 10:26 AM

## 2015-02-24 NOTE — Care Management Important Message (Signed)
Important Message  Patient Details  Name: Michael Blanchard MRN: [id number] Date of Birth: [birth year]   Medicare Important Message Given:  Yes    Camillo Flaming 02/24/2015, 2:09 [place] Message  Patient Details  Name: Michael Blanchard MRN: [id number] Date of Birth: September 20, 1925   Medicare Important Message Given:  Yes    Camillo Flaming 02/24/2015, 2:09 PM

## 2015-02-25 DIAGNOSIS — N183 Chronic kidney disease, stage 3 (moderate): Secondary | ICD-10-CM | POA: Diagnosis not present

## 2015-02-25 DIAGNOSIS — Z954 Presence of other heart-valve replacement: Secondary | ICD-10-CM | POA: Diagnosis not present

## 2015-02-25 DIAGNOSIS — C180 Malignant neoplasm of cecum: Secondary | ICD-10-CM | POA: Diagnosis not present

## 2015-02-25 DIAGNOSIS — I10 Essential (primary) hypertension: Secondary | ICD-10-CM | POA: Diagnosis not present

## 2015-02-25 DIAGNOSIS — I2510 Atherosclerotic heart disease of native coronary artery without angina pectoris: Secondary | ICD-10-CM | POA: Diagnosis not present

## 2015-02-28 ENCOUNTER — Encounter (HOSPITAL_COMMUNITY): Payer: Self-pay

## 2015-03-03 DIAGNOSIS — R600 Localized edema: Secondary | ICD-10-CM | POA: Diagnosis not present

## 2015-03-03 DIAGNOSIS — R05 Cough: Secondary | ICD-10-CM | POA: Diagnosis not present

## 2015-03-05 DIAGNOSIS — I509 Heart failure, unspecified: Secondary | ICD-10-CM | POA: Diagnosis not present

## 2015-03-10 DIAGNOSIS — N183 Chronic kidney disease, stage 3 (moderate): Secondary | ICD-10-CM | POA: Diagnosis not present

## 2015-03-10 DIAGNOSIS — Z952 Presence of prosthetic heart valve: Secondary | ICD-10-CM | POA: Diagnosis not present

## 2015-03-10 DIAGNOSIS — G894 Chronic pain syndrome: Secondary | ICD-10-CM | POA: Diagnosis not present

## 2015-03-10 DIAGNOSIS — M25512 Pain in left shoulder: Secondary | ICD-10-CM | POA: Diagnosis not present

## 2015-03-10 DIAGNOSIS — E785 Hyperlipidemia, unspecified: Secondary | ICD-10-CM | POA: Diagnosis not present

## 2015-03-10 DIAGNOSIS — Z483 Aftercare following surgery for neoplasm: Secondary | ICD-10-CM | POA: Diagnosis not present

## 2015-03-10 DIAGNOSIS — I1310 Hypertensive heart and chronic kidney disease without heart failure, with stage 1 through stage 4 chronic kidney disease, or unspecified chronic kidney disease: Secondary | ICD-10-CM | POA: Diagnosis not present

## 2015-03-10 DIAGNOSIS — C180 Malignant neoplasm of cecum: Secondary | ICD-10-CM | POA: Diagnosis not present

## 2015-03-10 DIAGNOSIS — M170 Bilateral primary osteoarthritis of knee: Secondary | ICD-10-CM | POA: Diagnosis not present

## 2015-03-10 DIAGNOSIS — Z951 Presence of aortocoronary bypass graft: Secondary | ICD-10-CM | POA: Diagnosis not present

## 2015-03-10 DIAGNOSIS — E7800 Pure hypercholesterolemia, unspecified: Secondary | ICD-10-CM | POA: Diagnosis not present

## 2015-03-10 DIAGNOSIS — H9193 Unspecified hearing loss, bilateral: Secondary | ICD-10-CM | POA: Diagnosis not present

## 2015-03-10 DIAGNOSIS — G500 Trigeminal neuralgia: Secondary | ICD-10-CM | POA: Diagnosis not present

## 2015-03-10 DIAGNOSIS — D649 Anemia, unspecified: Secondary | ICD-10-CM | POA: Diagnosis not present

## 2015-03-12 DIAGNOSIS — C180 Malignant neoplasm of cecum: Secondary | ICD-10-CM | POA: Diagnosis not present

## 2015-03-12 DIAGNOSIS — G894 Chronic pain syndrome: Secondary | ICD-10-CM | POA: Diagnosis not present

## 2015-03-12 DIAGNOSIS — M170 Bilateral primary osteoarthritis of knee: Secondary | ICD-10-CM | POA: Diagnosis not present

## 2015-03-12 DIAGNOSIS — Z483 Aftercare following surgery for neoplasm: Secondary | ICD-10-CM | POA: Diagnosis not present

## 2015-03-12 DIAGNOSIS — G500 Trigeminal neuralgia: Secondary | ICD-10-CM | POA: Diagnosis not present

## 2015-03-12 DIAGNOSIS — M25512 Pain in left shoulder: Secondary | ICD-10-CM | POA: Diagnosis not present

## 2015-03-14 DIAGNOSIS — G894 Chronic pain syndrome: Secondary | ICD-10-CM | POA: Diagnosis not present

## 2015-03-14 DIAGNOSIS — M170 Bilateral primary osteoarthritis of knee: Secondary | ICD-10-CM | POA: Diagnosis not present

## 2015-03-14 DIAGNOSIS — C180 Malignant neoplasm of cecum: Secondary | ICD-10-CM | POA: Diagnosis not present

## 2015-03-14 DIAGNOSIS — G500 Trigeminal neuralgia: Secondary | ICD-10-CM | POA: Diagnosis not present

## 2015-03-14 DIAGNOSIS — Z483 Aftercare following surgery for neoplasm: Secondary | ICD-10-CM | POA: Diagnosis not present

## 2015-03-14 DIAGNOSIS — M25512 Pain in left shoulder: Secondary | ICD-10-CM | POA: Diagnosis not present

## 2015-03-17 DIAGNOSIS — G894 Chronic pain syndrome: Secondary | ICD-10-CM | POA: Diagnosis not present

## 2015-03-17 DIAGNOSIS — Z483 Aftercare following surgery for neoplasm: Secondary | ICD-10-CM | POA: Diagnosis not present

## 2015-03-17 DIAGNOSIS — G500 Trigeminal neuralgia: Secondary | ICD-10-CM | POA: Diagnosis not present

## 2015-03-17 DIAGNOSIS — C180 Malignant neoplasm of cecum: Secondary | ICD-10-CM | POA: Diagnosis not present

## 2015-03-17 DIAGNOSIS — M25512 Pain in left shoulder: Secondary | ICD-10-CM | POA: Diagnosis not present

## 2015-03-17 DIAGNOSIS — M170 Bilateral primary osteoarthritis of knee: Secondary | ICD-10-CM | POA: Diagnosis not present

## 2015-03-19 DIAGNOSIS — C180 Malignant neoplasm of cecum: Secondary | ICD-10-CM | POA: Diagnosis not present

## 2015-03-19 DIAGNOSIS — M170 Bilateral primary osteoarthritis of knee: Secondary | ICD-10-CM | POA: Diagnosis not present

## 2015-03-19 DIAGNOSIS — M25512 Pain in left shoulder: Secondary | ICD-10-CM | POA: Diagnosis not present

## 2015-03-19 DIAGNOSIS — Z483 Aftercare following surgery for neoplasm: Secondary | ICD-10-CM | POA: Diagnosis not present

## 2015-03-19 DIAGNOSIS — G500 Trigeminal neuralgia: Secondary | ICD-10-CM | POA: Diagnosis not present

## 2015-03-19 DIAGNOSIS — G894 Chronic pain syndrome: Secondary | ICD-10-CM | POA: Diagnosis not present

## 2015-03-21 ENCOUNTER — Telehealth: Payer: Self-pay | Admitting: Neurology

## 2015-03-21 DIAGNOSIS — Z483 Aftercare following surgery for neoplasm: Secondary | ICD-10-CM | POA: Diagnosis not present

## 2015-03-21 DIAGNOSIS — C180 Malignant neoplasm of cecum: Secondary | ICD-10-CM | POA: Diagnosis not present

## 2015-03-21 DIAGNOSIS — G894 Chronic pain syndrome: Secondary | ICD-10-CM | POA: Diagnosis not present

## 2015-03-21 DIAGNOSIS — M25512 Pain in left shoulder: Secondary | ICD-10-CM | POA: Diagnosis not present

## 2015-03-21 DIAGNOSIS — M170 Bilateral primary osteoarthritis of knee: Secondary | ICD-10-CM | POA: Diagnosis not present

## 2015-03-21 DIAGNOSIS — G500 Trigeminal neuralgia: Secondary | ICD-10-CM | POA: Diagnosis not present

## 2015-03-21 MED ORDER — CARBAMAZEPINE 200 MG PO TABS: 200.0000 mg | ORAL_TABLET | Freq: Two times a day (BID) | ORAL | Status: DC

## 2015-03-21 NOTE — Telephone Encounter (Signed)
Daughter [name] called to request refill of carbamazepine (TEGRETOL) 200 MG tablet

## 2015-03-24 ENCOUNTER — Ambulatory Visit: Payer: Medicare Other | Admitting: Neurology

## 2015-03-24 DIAGNOSIS — Z483 Aftercare following surgery for neoplasm: Secondary | ICD-10-CM | POA: Diagnosis not present

## 2015-03-24 DIAGNOSIS — C180 Malignant neoplasm of cecum: Secondary | ICD-10-CM | POA: Diagnosis not present

## 2015-03-24 DIAGNOSIS — M170 Bilateral primary osteoarthritis of knee: Secondary | ICD-10-CM | POA: Diagnosis not present

## 2015-03-24 DIAGNOSIS — G894 Chronic pain syndrome: Secondary | ICD-10-CM | POA: Diagnosis not present

## 2015-03-24 DIAGNOSIS — G500 Trigeminal neuralgia: Secondary | ICD-10-CM | POA: Diagnosis not present

## 2015-03-24 DIAGNOSIS — M25512 Pain in left shoulder: Secondary | ICD-10-CM | POA: Diagnosis not present

## 2015-03-25 ENCOUNTER — Telehealth: Payer: Self-pay | Admitting: Neurology

## 2015-03-25 DIAGNOSIS — G500 Trigeminal neuralgia: Secondary | ICD-10-CM

## 2015-03-25 MED ORDER — CARBAMAZEPINE 200 MG PO TABS: 200.0000 mg | ORAL_TABLET | Freq: Two times a day (BID) | ORAL | Status: DC

## 2015-03-25 NOTE — Telephone Encounter (Signed)
Patient is calling and states the Rx Carbamazepine 200 mg should be sent to [facility].  [facility] does not accept their insurance any more.  Thanks!

## 2015-03-26 ENCOUNTER — Other Ambulatory Visit: Payer: Self-pay | Admitting: Surgery

## 2015-03-27 ENCOUNTER — Other Ambulatory Visit: Payer: Self-pay | Admitting: Neurology

## 2015-03-27 DIAGNOSIS — G894 Chronic pain syndrome: Secondary | ICD-10-CM | POA: Diagnosis not present

## 2015-03-27 DIAGNOSIS — M25512 Pain in left shoulder: Secondary | ICD-10-CM | POA: Diagnosis not present

## 2015-03-27 DIAGNOSIS — G500 Trigeminal neuralgia: Secondary | ICD-10-CM | POA: Diagnosis not present

## 2015-03-27 DIAGNOSIS — M170 Bilateral primary osteoarthritis of knee: Secondary | ICD-10-CM | POA: Diagnosis not present

## 2015-03-27 DIAGNOSIS — Z483 Aftercare following surgery for neoplasm: Secondary | ICD-10-CM | POA: Diagnosis not present

## 2015-03-27 DIAGNOSIS — C180 Malignant neoplasm of cecum: Secondary | ICD-10-CM | POA: Diagnosis not present

## 2015-03-28 DIAGNOSIS — M25512 Pain in left shoulder: Secondary | ICD-10-CM | POA: Diagnosis not present

## 2015-03-28 DIAGNOSIS — Z483 Aftercare following surgery for neoplasm: Secondary | ICD-10-CM | POA: Diagnosis not present

## 2015-03-28 DIAGNOSIS — M170 Bilateral primary osteoarthritis of knee: Secondary | ICD-10-CM | POA: Diagnosis not present

## 2015-03-28 DIAGNOSIS — G894 Chronic pain syndrome: Secondary | ICD-10-CM | POA: Diagnosis not present

## 2015-03-28 DIAGNOSIS — G500 Trigeminal neuralgia: Secondary | ICD-10-CM | POA: Diagnosis not present

## 2015-03-28 DIAGNOSIS — C180 Malignant neoplasm of cecum: Secondary | ICD-10-CM | POA: Diagnosis not present

## 2015-04-01 DIAGNOSIS — G500 Trigeminal neuralgia: Secondary | ICD-10-CM | POA: Diagnosis not present

## 2015-04-01 DIAGNOSIS — M170 Bilateral primary osteoarthritis of knee: Secondary | ICD-10-CM | POA: Diagnosis not present

## 2015-04-01 DIAGNOSIS — G894 Chronic pain syndrome: Secondary | ICD-10-CM | POA: Diagnosis not present

## 2015-04-01 DIAGNOSIS — C180 Malignant neoplasm of cecum: Secondary | ICD-10-CM | POA: Diagnosis not present

## 2015-04-01 DIAGNOSIS — M25512 Pain in left shoulder: Secondary | ICD-10-CM | POA: Diagnosis not present

## 2015-04-01 DIAGNOSIS — Z483 Aftercare following surgery for neoplasm: Secondary | ICD-10-CM | POA: Diagnosis not present

## 2015-04-03 DIAGNOSIS — G894 Chronic pain syndrome: Secondary | ICD-10-CM | POA: Diagnosis not present

## 2015-04-03 DIAGNOSIS — G500 Trigeminal neuralgia: Secondary | ICD-10-CM | POA: Diagnosis not present

## 2015-04-03 DIAGNOSIS — M25512 Pain in left shoulder: Secondary | ICD-10-CM | POA: Diagnosis not present

## 2015-04-03 DIAGNOSIS — M170 Bilateral primary osteoarthritis of knee: Secondary | ICD-10-CM | POA: Diagnosis not present

## 2015-04-03 DIAGNOSIS — C180 Malignant neoplasm of cecum: Secondary | ICD-10-CM | POA: Diagnosis not present

## 2015-04-03 DIAGNOSIS — Z483 Aftercare following surgery for neoplasm: Secondary | ICD-10-CM | POA: Diagnosis not present

## 2015-04-17 ENCOUNTER — Telehealth: Payer: Self-pay | Admitting: Neurology

## 2015-04-17 NOTE — Telephone Encounter (Addendum)
Chart had already been updated.

## 2015-04-17 NOTE — Telephone Encounter (Signed)
Daughter [name] called to advise of Pharmacy change from [facility] to [facility] Fax# [phone].

## 2015-04-23 DIAGNOSIS — M25511 Pain in right shoulder: Secondary | ICD-10-CM | POA: Diagnosis not present

## 2015-04-23 DIAGNOSIS — M1711 Unilateral primary osteoarthritis, right knee: Secondary | ICD-10-CM | POA: Diagnosis not present

## 2015-04-23 DIAGNOSIS — M1712 Unilateral primary osteoarthritis, left knee: Secondary | ICD-10-CM | POA: Diagnosis not present

## 2015-04-28 ENCOUNTER — Other Ambulatory Visit: Payer: Self-pay | Admitting: Cardiology

## 2015-04-28 MED ORDER — ROSUVASTATIN CALCIUM 10 MG PO TABS: 5.0000 mg | ORAL_TABLET | ORAL | Status: DC

## 2015-05-01 ENCOUNTER — Encounter: Payer: Self-pay | Admitting: Cardiology

## 2015-05-01 ENCOUNTER — Ambulatory Visit (INDEPENDENT_AMBULATORY_CARE_PROVIDER_SITE_OTHER): Payer: Medicare Other | Admitting: Cardiology

## 2015-05-01 VITALS — BP 152/62 | HR 58 | Ht 68.0 in | Wt 162.4 lb

## 2015-05-01 DIAGNOSIS — Z85038 Personal history of other malignant neoplasm of large intestine: Secondary | ICD-10-CM

## 2015-05-01 DIAGNOSIS — Z954 Presence of other heart-valve replacement: Secondary | ICD-10-CM

## 2015-05-01 DIAGNOSIS — Z953 Presence of xenogenic heart valve: Secondary | ICD-10-CM

## 2015-05-01 DIAGNOSIS — I119 Hypertensive heart disease without heart failure: Secondary | ICD-10-CM | POA: Diagnosis not present

## 2015-05-01 DIAGNOSIS — R0600 Dyspnea, unspecified: Secondary | ICD-10-CM

## 2015-05-01 LAB — BASIC METABOLIC PANEL
BUN: 20 mg/dL (ref 7–25)
CHLORIDE: 106 mmol/L (ref 98–110)
CO2: 26 mmol/L (ref 20–31)
Calcium: 9.2 mg/dL (ref 8.6–10.3)
Creat: 1.09 mg/dL (ref 0.70–1.11)
Glucose, Bld: 75 mg/dL (ref 65–99)
POTASSIUM: 4.5 mmol/L (ref 3.5–5.3)
Sodium: 138 mmol/L (ref 135–146)

## 2015-05-01 LAB — CBC WITH DIFFERENTIAL/PLATELET
BASOS ABS: 0 10*3/uL (ref 0.0–0.1)
Basophils Relative: 0 % (ref 0–1)
EOS ABS: 0.1 10*3/uL (ref 0.0–0.7)
EOS PCT: 1 % (ref 0–5)
HCT: 35.3 % — ABNORMAL LOW (ref 39.0–52.0)
Hemoglobin: 11.4 g/dL — ABNORMAL LOW (ref 13.0–17.0)
LYMPHS ABS: 1.6 10*3/uL (ref 0.7–4.0)
LYMPHS PCT: 26 % (ref 12–46)
MCH: 31.5 pg (ref 26.0–34.0)
MCHC: 32.3 g/dL (ref 30.0–36.0)
MCV: 97.5 fL (ref 78.0–100.0)
MPV: 10.1 fL (ref 8.6–12.4)
Monocytes Absolute: 0.6 10*3/uL (ref 0.1–1.0)
Monocytes Relative: 10 % (ref 3–12)
NEUTROS PCT: 63 % (ref 43–77)
Neutro Abs: 4 10*3/uL (ref 1.7–7.7)
PLATELETS: 190 10*3/uL (ref 150–400)
RBC: 3.62 MIL/uL — AB (ref 4.22–5.81)
RDW: 15.8 % — AB (ref 11.5–15.5)
WBC: 6.3 10*3/uL (ref 4.0–10.5)

## 2015-05-01 NOTE — Progress Notes (Signed)
Cardiology Office Note   Date:  05/01/2015   ID:  Michael Blanchard, DOB 1925/03/28, MRN [id number]  PCP:  Michael Hams, MD  Cardiologist: Michael Coco MD  Chief Complaint  Patient presents with  . routine follow up      History of Present Illness: Michael Blanchard is a 82 y.o. male who presents for  scheduled 6 month follow-up visit . He has a history of ischemic heart disease and valvular heart disease. He had coronary artery bypass graft surgery as well as aortic valve replacement using a tissue valve in 2001. He has done well postoperatively. He had a nuclear stress test in 2007 showing no evidence of ischemia. His ejection fraction was 59%. He had an echocardiogram in February 2010 showing normal LV systolic function with impaired relaxation and showed normal tissue aortic valve replacement with no aortic insufficiency. He has a history of high blood pressure and history of hypercholesterolemia. Since last visit he has been doing well except for chronic arthritis of both of his knees.he has been getting steroid shots in his knees at intervals by Dr. Jean Blanchard. His primary care provider is Dr. Delfina Blanchard. For exercise he goes to the gym several times a week and uses an exercise bicycle. Since we last saw him he developed a severe iron deficiency anemia.  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  He went on to have successful surgery with metal of the tumor by Dr. Michael Blanchard. He has not been expressing any chest pain.  He still has exertional dyspnea.  He states that he is no longer taking oral iron.  All other medicines are the same he states.  However his daughter is in charge of his medicines  Past Medical History  Diagnosis Date  . Hyperlipidemia   . Hypertension   . AVD (aortic valve disease)   . Coronary atherosclerosis   . Fatigue   . Complication of anesthesia     family states pt  memory was bad 3-4 weeks after surgery  . Chronic kidney disease     stage 3 kidney disease  . Trigeminal neuralgia   . Arthritis     bil knees, right shoulder  . Stroke [facility])     "I had a very milk sstroke one time the doctor said", current mini strokes    Past Surgical History  Procedure Laterality Date  . Cardiac catheterization  06/22/1999    SEVERE HYPOKINESIA OF THE MID TO DISTAL INFERIOR WALL AND APEX.MODERATE LEFT VENTRICULAR DYSFUNCTION WITH EF 45%  . Aortic valve replacement      Carpentier-Edwards Pericardial Valve, MR Safe 1.5T  . Coronary artery bypass graft  2001  . Esophagogastroduodenoscopy N/A 01/10/2015    Procedure: ESOPHAGOGASTRODUODENOSCOPY (EGD);  Surgeon: Michael Essex, MD;  Location: [facility] ENDOSCOPY;  Service: Endoscopy;  Laterality: N/A;  . Colonoscopy N/A 01/12/2015    Procedure: COLONOSCOPY;  Surgeon: Michael Corner, MD;  Location: [facility] ENDOSCOPY;  Service: Endoscopy;  Laterality: N/A;     Current Outpatient Prescriptions  Medication Sig Dispense Refill  . amLODipine (NORVASC) 2.5 MG tablet Take 2.5 mg by mouth daily.    . Calcium Carbonate-Vitamin D (CALCIUM 600 + D PO) Take 1 tablet by mouth at bedtime.     . carbamazepine (TEGRETOL) 200 MG tablet Take 1 tablet (200 mg total) by mouth 2 (two) times daily. 180 tablet 0  .  clopidogrel (PLAVIX) 75 MG tablet Take 75 mg by mouth daily.    Marland Kitchen CRANBERRY PO Take 4,200 mg by mouth 2 (two) times daily.    Marland Kitchen donepezil (ARICEPT) 10 MG tablet Take 10 mg by mouth at bedtime.    . ferrous sulfate 325 (65 FE) MG tablet Take 325 mg by mouth 3 (three) times daily with meals.    Marland Kitchen HYDROcodone-acetaminophen (NORCO) 7.5-325 MG tablet Take 1 tablet by mouth every 4 (four) hours as needed for moderate pain or severe pain (pain). 40 tablet 0  . losartan-hydrochlorothiazide (HYZAAR) 100-25 MG tablet Take 1 tablet by mouth daily. 90 tablet 3  . metoprolol (LOPRESSOR) 50 MG tablet Take 50 mg by mouth 2 (two) times daily.    . Misc  Natural Products (OSTEO BI-FLEX ADV DOUBLE ST PO) Take 1 tablet by mouth 2 (two) times daily.    . Multiple Vitamin (MULTIVITAMIN) tablet Take 1 tablet by mouth daily.      . nitroGLYCERIN (NITROSTAT) 0.4 MG SL tablet Place 1 tablet (0.4 mg total) under the tongue every 5 (five) minutes as needed for chest pain. 25 tablet 3  . polyethylene glycol (MIRALAX / GLYCOLAX) packet Take 17 g by mouth daily.    . rosuvastatin (CRESTOR) 10 MG tablet Take 0.5 tablets (5 mg total) by mouth See admin instructions. Take 3 times a week on Tuesdays, Thursdays, Saturdays 20 tablet 1  . tamsulosin (FLOMAX) 0.4 MG CAPS capsule Take 0.4 mg by mouth daily with lunch.   11   No current facility-administered medications for this visit.    Allergies:   Gabapentin; Lipitor; and Niaspan    Social History:  The patient  reports that he quit smoking about 29 years ago. He has never used smokeless tobacco. He reports that he does not drink alcohol or use illicit drugs.   Family History:  The patient's family history includes Aneurysm in his father and mother; Heart disease in his brother; Stroke in his mother.    ROS:  Please see the history of present illness.   Otherwise, review of systems are positive for none.   All other systems are reviewed and negative.    PHYSICAL EXAM: VS:  BP 152/62 mmHg  Pulse 58  Ht 5\' 8"  (1.727 m)  Wt 162 lb 6.4 oz (73.664 kg)  BMI 24.70 kg/m2 , BMI Body mass index is 24.7 kg/(m^2). GEN: Well nourished, well developed, in no acute distress HEENT: normal Neck: no JVD, carotid bruits, or masses Cardiac: RRR; there is a soft systolic ejection murmur across the aortic area.  No diastolic murmur.  No, rubs, or gallops,no edema  Respiratory:  clear to auscultation bilaterally, normal work of breathing GI: soft, nontender, nondistended, + BS MS: no deformity or atrophy Skin: warm and dry, no rash Neuro:  Strength and sensation are intact Psych: euthymic mood, full affect   EKG:  EKG  is ordered today. The ekg ordered today demonstrates sinus bradycardia with sinus arrhythmia.  Incomplete left bundle-branch block with nonspecific T-wave abnormality.  Since previous tracing of 01/09/15, no significant change   Recent Labs: 01/07/2015: ALT 9*; TSH 1.721 02/20/2015: BUN 24*; Platelets 239; Sodium 141 02/22/2015: Magnesium 2.0 02/24/2015: Creatinine, Ser 1.35*; Hemoglobin 8.4*; Potassium 4.0    Lipid Panel    Component Value Date/Time   CHOL 159 05/22/2012 1031   TRIG 131.0 05/22/2012 1031   HDL 43.80 05/22/2012 1031   CHOLHDL 4 05/22/2012 1031   VLDL 26.2 05/22/2012 1031   LDLCALC  89 05/22/2012 1031      Wt Readings from Last 3 Encounters:  05/01/15 162 lb 6.4 oz (73.664 kg)  02/23/15 167 lb 1.7 oz (75.8 kg)  02/14/15 164 lb 14.4 oz (74.798 kg)        ASSESSMENT AND PLAN:  1.ischemic heart disease status post CABG in 2001 2. Valvular heart disease with prior severe aortic stenosis and history of aortic valve replacement using a tissue valve in 2001.  Most recent echocardiogram 01/07/15 demonstrates LVEF 55-60%, moderate LVH, normal wall motion, Grade 2 diastolic dysfunction, bioprosthetic AVR without obstruction, severe LAE, moderate MR with heavily calcified mitral annulus and leaflets, atrial septal aneurysm with PFO and left to right shunting, mild TR, RVSP 33 mmHg. 3. Essential hypertension without heart failure 4. Hypercholesterolemia followed by his primary care provider Dr. Delfina Blanchard 5. Osteoarthritis of both knees 6.  Past history of severe anemia.  Admitted with hemoglobin 6.4 in October 2016.  Found to have cecal mass which was subsequently removed by Dr. Michael Blanchard.  No postoperative chemotherapy was indicated.  Disposition: We are checking a CBC and a BMET today.  Continue current medication.He is no longer on oral iron.  Recheck in 6 months for office visit with Dr. Marlou Porch   Current medicines are reviewed at length with the  patient today.  The patient does not have concerns regarding medicines.  The following changes have been made:  no change  Labs/ tests ordered today include:   Orders Placed This Encounter  Procedures  . CBC with Differential/Platelet  . Basic metabolic panel  . EKG 12-Lead      Berna Spare MD 05/01/2015 12:53 PM    [facility] [address] [address], [place], [place]  [address] Phone: [phone]; Fax: [phone]

## 2015-05-01 NOTE — Patient Instructions (Signed)
Medication Instructions:  Your physician recommends that you continue on your current medications as directed. Please refer to the Current Medication list given to you today.  Labwork: BMET/CBC  Testing/Procedures: NONE  Follow-Up: Your physician wants you to follow-up in: [place] will receive a reminder letter in the mail two months in advance. If you don't receive a letter, please call our office to schedule the follow-up appointment.  Any Other Special Instructions Will Be Listed Below (If Applicable).     If you need a refill on your cardiac medications before your next appointment, please call your pharmacy.

## 2015-05-02 NOTE — Progress Notes (Signed)
Quick Note:  Please report to patient. The recent labs are stable. Continue same medication and careful diet. The hemoglobin is better. ______

## 2015-05-07 ENCOUNTER — Ambulatory Visit (INDEPENDENT_AMBULATORY_CARE_PROVIDER_SITE_OTHER): Payer: Medicare Other | Admitting: Neurology

## 2015-05-07 ENCOUNTER — Encounter: Payer: Self-pay | Admitting: Neurology

## 2015-05-07 VITALS — BP 150/60 | HR 57 | Ht 66.5 in | Wt 159.0 lb

## 2015-05-07 DIAGNOSIS — G500 Trigeminal neuralgia: Secondary | ICD-10-CM

## 2015-05-07 MED ORDER — CARBAMAZEPINE 200 MG PO TABS: 200.0000 mg | ORAL_TABLET | Freq: Two times a day (BID) | ORAL | Status: DC

## 2015-05-07 NOTE — Progress Notes (Signed)
Chief Complaint  Patient presents with  . Trigeminal Neuralgia    He is here for his yearly follow up appointment.  Reports no flare-ups in the last year.   Subjective:    Patient ID: Michael Blanchard is a 82 y.o. male.  Michael Blanchard is a very pleasant 55 -year-old right-handed gentleman who presents for followup consultation of his memory loss, trigeminal neuralgia, and prior history of TIA.  He has past medical history of hypertension, hyperlipidemia, heart disease, status post aortic valve replacement and five-vessel CABG 25 years ago, trigeminal neuralgia, arthritis and memory loss.   He had a history of trigeminal neuralgia for more than 2 decades. He was first treated with gabapentin for this. He was then placed on carbamazepine. He started having memory problems.   In April 2013 he had a TIA with weakness and fall. He was on a baby aspirin daily and Plavix was added. 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 There was kinking of the left ICA. MRA brain showed diffuse atherosclerotic disease with severe narrowing of the bilateral internal carotid arteries at the siphon and moderate to severe stenosis of the inferior basilar artery and focal stenosis of the right PCA.   MRI brain showed mild diffuse and moderate mesial temporal atrophy and chronic left cerebellar ischemic infarct.  In January 2014 his MMSE was 28, clock drawing was 4, animal fluency was 15.  He states his trigeminal neuralgia is stable. He is taking carbamazepine 200 mg daily, he still has occasionally left upper eyelid, left nasal bridge shooting pain, he is also on frequent oxycodone, hydrocodone for his chronic bilateral knee pain, and low back pain, he is happy about current pain control,   Last Tegretol level was 3.4 in July 2014  UPDATE Jan 7th 2016: He drove himself to office today, he lives with his wife, with the help of his children, he has mild gait difficulty due to joints pain, he only has  occasionally recurrent left trigeminal facial pain, doing well on current Tegretol 200 mg twice a day, does not want to make any change, also need refill of his Plavix.  UPDATE May 07 2015: He had robotically assisted proximal RIGHT colectomy in December. 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 Hyperlipidemia   . Hypertension   . AVD (aortic valve disease)   . Coronary atherosclerosis   . Fatigue   . Complication of anesthesia     family states pt memory was bad 3-4 weeks after surgery  . Chronic kidney disease     stage 3 kidney disease  . Trigeminal neuralgia   . Arthritis     bil knees, right shoulder  . Stroke [facility])     "I had a very milk sstroke one time the doctor said", current mini strokes    His Past Surgical History Is Significant For: Past Surgical History  Procedure Laterality Date  . Cardiac catheterization  06/22/1999    SEVERE HYPOKINESIA OF THE MID TO DISTAL INFERIOR WALL AND APEX.MODERATE LEFT VENTRICULAR DYSFUNCTION WITH EF 45%  . Aortic valve replacement      Carpentier-Edwards Pericardial Valve, MR Safe 1.5T  . Coronary artery bypass graft  2001  . Esophagogastroduodenoscopy N/A 01/10/2015    Procedure: ESOPHAGOGASTRODUODENOSCOPY (EGD);  Surgeon: Clarene Essex, MD;  Location: [facility] ENDOSCOPY;  Service: Endoscopy;  Laterality: N/A;  . Colonoscopy N/A 01/12/2015    Procedure: COLONOSCOPY;  Surgeon: Wilford Corner, MD;  Location: [facility] ENDOSCOPY;  Service: Endoscopy;  Laterality: N/A;    His Family History Is Significant For: Family History  Problem Relation Age of Onset  . Stroke Mother   . Aneurysm Mother   . Aneurysm Father   .  Heart disease Brother     His Social History Is Significant For: Social History   Social History  . Marital Status: Married    Spouse Name: Murray Hodgkins  . Number of Children: 4  . Years of Education: 12   Occupational History  .      retired   Social History Main Topics  . Smoking status: Former Smoker    Quit date: 09/13/1985  . Smokeless tobacco: Never Used  . Alcohol Use: No  . Drug Use: No  . Sexual Activity: Not Asked   Other Topics Concern  . None   Social History Narrative   Pt lives at home with his spouse. [name]) [name]   Caffeine Use: - Two cups of coffee daily and one pepsi daily.   Right handed.   Education- High school.    His Allergies Are:  Allergies  Allergen Reactions  . Gabapentin     Unknown rxn  . Lipitor [Atorvastatin Calcium]     Unknown rxn  . Niaspan [Niacin]     Unknown rxn  :   His Current Medications Are:  Outpatient Encounter Prescriptions as of 05/07/2015  Medication Sig  . amLODipine (NORVASC) 2.5 MG tablet Take 2.5 mg by mouth daily.  . Calcium Carbonate-Vitamin D (CALCIUM 600 + D PO) Take 1 tablet by mouth at bedtime.   . carbamazepine (TEGRETOL) 200 MG tablet Take 1 tablet (200 mg total) by mouth 2 (two) times daily.  . clopidogrel (PLAVIX) 75 MG tablet Take 75 mg by mouth daily.  Marland Kitchen CRANBERRY PO Take 4,200 mg by mouth 2 (two) times daily.  Marland Kitchen donepezil (ARICEPT) 10 MG tablet Take 10 mg by mouth at bedtime.  . ferrous sulfate 325 (65 FE) MG tablet Take 325 mg by mouth 3 (three) times daily with meals.  Marland Kitchen HYDROcodone-acetaminophen (NORCO) 7.5-325 MG tablet Take 1 tablet by mouth every 4 (four) hours as needed for moderate pain or severe pain (pain).  Marland Kitchen losartan-hydrochlorothiazide (HYZAAR) 100-25 MG tablet Take 1 tablet by mouth daily.  . metoprolol (LOPRESSOR) 50 MG tablet Take 50 mg by mouth 2 (two) times daily.  . Misc Natural Products (OSTEO BI-FLEX ADV DOUBLE ST PO) Take 1 tablet by mouth 2 (two) times daily.  . Multiple Vitamin  (MULTIVITAMIN) tablet Take 1 tablet by mouth daily.    . nitroGLYCERIN (NITROSTAT) 0.4 MG SL tablet Place 1 tablet (0.4 mg total) under the tongue every 5 (five) minutes as needed for chest pain.  . polyethylene glycol (MIRALAX / GLYCOLAX) packet Take 17 g by mouth daily.  . rosuvastatin (CRESTOR) 10 MG tablet Take 0.5 tablets (5 mg total) by mouth See admin instructions. Take 3 times a week on Tuesdays, Thursdays, Saturdays  . tamsulosin (FLOMAX) 0.4 MG CAPS capsule Take 0.4 mg by mouth daily with lunch.    No facility-administered encounter medications on file as of 05/07/2015.   Review of Systems Is positive for bilateral knee pain, gait difficulty,   PHYSICAL EXAMINATOINS:  Generalized:  In no acute distress  Neck: Supple, no carotid bruits   Cardiac: Regular rate rhythm  Pulmonary: Clear to auscultation bilaterally  Musculoskeletal: No deformity  Neurological examination  Mentation: Alert oriented to time, place, history taking, and causual conversation, MMSE 30/30  Cranial nerve II-XII: Pupils were equal round reactive to light extraocular movements were full, visual field were full on confrontational test.  Bilateral fundi were sharp  Facial sensation and strength were normal. hearing was intact to finger rubbing bilaterally. Uvula tongue midline.  head turning and shoulder shrug and were normal and symmetric.Tongue protrusion into cheek strength was normal.  Motor: normal tone, bulk and strength.  Sensory: Intact to fine touch   Coordination: Normal finger to nose, heel-to-shin bilaterally there was no truncal ataxia  Gait: atalgic, cautious gait.  Deep tendon reflexes: Brachioradialis 1/1, biceps 1/1, triceps 1/1, patellar absent, Achilles absent, plantar responses were flexor bilaterally.  Assessment and Plan:  82 years old Caucasian male, with past medical history of left trigeminal neuralgia, doing very well, with his current dose of carbamazepine, 200 mg twice  daily,, he is also has multiple vascular risk factors,  I have refilled his carbamazepine 200 mg twice a day  Marcial Pacas, [name] Ph.D.  [facility] [place], [place] [address] Phone: [phone] Fax:      [phone]

## 2015-05-08 ENCOUNTER — Ambulatory Visit
Admission: RE | Admit: 2015-05-08 | Discharge: 2015-05-08 | Disposition: A | Discharge status: Home / Self Care | Payer: Medicare Other | Source: Ambulatory Visit | Attending: Anesthesiology | Admitting: Anesthesiology

## 2015-05-08 ENCOUNTER — Other Ambulatory Visit: Payer: Self-pay | Admitting: Anesthesiology

## 2015-05-08 DIAGNOSIS — M25559 Pain in unspecified hip: Secondary | ICD-10-CM | POA: Diagnosis not present

## 2015-05-08 DIAGNOSIS — M1611 Unilateral primary osteoarthritis, right hip: Secondary | ICD-10-CM | POA: Diagnosis not present

## 2015-05-08 DIAGNOSIS — M25551 Pain in right hip: Secondary | ICD-10-CM

## 2015-05-08 DIAGNOSIS — Z79899 Other long term (current) drug therapy: Secondary | ICD-10-CM | POA: Diagnosis not present

## 2015-05-08 DIAGNOSIS — G894 Chronic pain syndrome: Secondary | ICD-10-CM | POA: Diagnosis not present

## 2015-05-08 DIAGNOSIS — M25569 Pain in unspecified knee: Secondary | ICD-10-CM | POA: Diagnosis not present

## 2015-05-12 DIAGNOSIS — C180 Malignant neoplasm of cecum: Secondary | ICD-10-CM | POA: Diagnosis not present

## 2015-05-12 DIAGNOSIS — Z954 Presence of other heart-valve replacement: Secondary | ICD-10-CM | POA: Diagnosis not present

## 2015-05-12 DIAGNOSIS — M170 Bilateral primary osteoarthritis of knee: Secondary | ICD-10-CM | POA: Diagnosis not present

## 2015-05-12 DIAGNOSIS — I10 Essential (primary) hypertension: Secondary | ICD-10-CM | POA: Diagnosis not present

## 2015-05-12 DIAGNOSIS — N183 Chronic kidney disease, stage 3 (moderate): Secondary | ICD-10-CM | POA: Diagnosis not present

## 2015-05-12 DIAGNOSIS — I2510 Atherosclerotic heart disease of native coronary artery without angina pectoris: Secondary | ICD-10-CM | POA: Diagnosis not present

## 2015-05-27 DIAGNOSIS — D485 Neoplasm of uncertain behavior of skin: Secondary | ICD-10-CM | POA: Diagnosis not present

## 2015-05-27 DIAGNOSIS — C44629 Squamous cell carcinoma of skin of left upper limb, including shoulder: Secondary | ICD-10-CM | POA: Diagnosis not present

## 2015-05-27 DIAGNOSIS — Z85828 Personal history of other malignant neoplasm of skin: Secondary | ICD-10-CM | POA: Diagnosis not present

## 2015-05-27 DIAGNOSIS — L858 Other specified epidermal thickening: Secondary | ICD-10-CM | POA: Diagnosis not present

## 2015-05-27 DIAGNOSIS — L570 Actinic keratosis: Secondary | ICD-10-CM | POA: Diagnosis not present

## 2015-05-29 ENCOUNTER — Other Ambulatory Visit: Payer: Self-pay | Admitting: *Deleted

## 2015-05-29 MED ORDER — DONEPEZIL HCL 10 MG PO TABS: 10.0000 mg | ORAL_TABLET | Freq: Every day | ORAL | Status: DC

## 2015-07-31 ENCOUNTER — Other Ambulatory Visit: Payer: Self-pay

## 2015-07-31 ENCOUNTER — Telehealth: Payer: Self-pay | Admitting: *Deleted

## 2015-07-31 DIAGNOSIS — Z79899 Other long term (current) drug therapy: Secondary | ICD-10-CM | POA: Diagnosis not present

## 2015-07-31 DIAGNOSIS — G894 Chronic pain syndrome: Secondary | ICD-10-CM | POA: Diagnosis not present

## 2015-07-31 DIAGNOSIS — Z79891 Long term (current) use of opiate analgesic: Secondary | ICD-10-CM | POA: Diagnosis not present

## 2015-07-31 DIAGNOSIS — M25559 Pain in unspecified hip: Secondary | ICD-10-CM | POA: Diagnosis not present

## 2015-07-31 DIAGNOSIS — M25569 Pain in unspecified knee: Secondary | ICD-10-CM | POA: Diagnosis not present

## 2015-07-31 MED ORDER — AMLODIPINE BESYLATE 2.5 MG PO TABS: 2.5000 mg | ORAL_TABLET | Freq: Every day | ORAL | Status: DC

## 2015-07-31 NOTE — Telephone Encounter (Signed)
He receives two prescriptions from Dr. Krista Blue - carbamazepine and donepezil - both prescriptions were sent in as a 90-day supply in 05/2015 with 3 additional refills.  Returned call to [facility] and they have both of these prescriptions and refills on file.

## 2015-07-31 NOTE — Telephone Encounter (Signed)
Caller [facility] [id number]  Patient Michael Blanchard         Pt's Dr Krista Blue          Area Code 800 Phone# [phone] * DOB Jun 26, 2025    RE CALLING IN 2 REFILL REQUESTS FOR PATIENT

## 2015-08-05 ENCOUNTER — Telehealth: Payer: Self-pay | Admitting: *Deleted

## 2015-08-05 MED ORDER — CLOPIDOGREL BISULFATE 75 MG PO TABS: 75.0000 mg | ORAL_TABLET | Freq: Every day | ORAL | Status: DC

## 2015-08-05 NOTE — Telephone Encounter (Signed)
Message For: OFFICE               Taken 23-MAY-17 at  9:30AM by [facility] ------------------------------------------------------------ Caller [name]-DAUGHTER               CID [id number]  Patient Michael Blanchard         Pt's Dr Krista Blue          Area Code 336 Phone# [phone] [place] 10-30-2025     RE CALLING TO GET A REFILL ON THE PT'S CLOPIDOGRIL                                                        Disp:Y/N N If Y = C/B If No Response In 34minutes ===========

## 2015-08-05 NOTE — Telephone Encounter (Signed)
RX appropriate and sent to the pharmacy.

## 2015-08-07 DIAGNOSIS — Z0000 Encounter for general adult medical examination without abnormal findings: Secondary | ICD-10-CM | POA: Diagnosis not present

## 2015-08-07 DIAGNOSIS — R351 Nocturia: Secondary | ICD-10-CM | POA: Diagnosis not present

## 2015-08-27 DIAGNOSIS — M170 Bilateral primary osteoarthritis of knee: Secondary | ICD-10-CM | POA: Diagnosis not present

## 2015-10-10 ENCOUNTER — Encounter: Payer: Self-pay | Admitting: Cardiology

## 2015-10-10 ENCOUNTER — Ambulatory Visit (INDEPENDENT_AMBULATORY_CARE_PROVIDER_SITE_OTHER): Payer: Medicare Other | Admitting: Cardiology

## 2015-10-10 VITALS — BP 148/70 | HR 60 | Ht 66.0 in | Wt 165.4 lb

## 2015-10-10 DIAGNOSIS — Z954 Presence of other heart-valve replacement: Secondary | ICD-10-CM | POA: Diagnosis not present

## 2015-10-10 DIAGNOSIS — Z951 Presence of aortocoronary bypass graft: Secondary | ICD-10-CM | POA: Diagnosis not present

## 2015-10-10 DIAGNOSIS — E7800 Pure hypercholesterolemia, unspecified: Secondary | ICD-10-CM

## 2015-10-10 DIAGNOSIS — Z85038 Personal history of other malignant neoplasm of large intestine: Secondary | ICD-10-CM

## 2015-10-10 DIAGNOSIS — I119 Hypertensive heart disease without heart failure: Secondary | ICD-10-CM | POA: Diagnosis not present

## 2015-10-10 DIAGNOSIS — I2510 Atherosclerotic heart disease of native coronary artery without angina pectoris: Secondary | ICD-10-CM

## 2015-10-10 DIAGNOSIS — I2583 Coronary atherosclerosis due to lipid rich plaque: Secondary | ICD-10-CM

## 2015-10-10 DIAGNOSIS — Z953 Presence of xenogenic heart valve: Secondary | ICD-10-CM

## 2015-10-10 NOTE — Progress Notes (Signed)
Cardiology Office Note    Date:  10/10/2015   ID:  Michael Blanchard, DOB 1925-11-12, MRN [id number]  PCP:  [name], MD  Cardiologist:   Candee Furbish, MD   No chief complaint on file.   History of Present Illness:  Michael Blanchard is a 82 y.o. male for a patient of Dr. Sherryl Barters here with his wife today has a history of ischemic heart disease and valvular heart disease. He had coronary artery bypass graft surgery as well as aortic valve replacement using a tissue valve in 2001. He has done well postoperatively. He had a nuclear stress test in 2007 showing no evidence of ischemia. His ejection fraction was 59%. He had an echocardiogram in February 2010 showing normal LV systolic function with impaired relaxation and showed normal tissue aortic valve replacement with no aortic insufficiency. He has a history of high blood pressure and history of hypercholesterolemia. Since last visit he has been doing well except for chronic arthritis of both of his knees.he has been getting steroid shots in his knees at intervals by Dr. Jean Rosenthal. His primary care provider is Dr. Delfina Redwood. For exercise he goes to the gym several times a week and uses an exercise bicycle. Since we last saw him he developed a severe iron deficiency anemia.  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  He went on to have successful surgery with metal of the tumor by Dr. Michael Boston. He has not been expressing any chest pain.  He still has exertional dyspnea.  He states that he is no longer taking oral iron.  All other medicines are the same he states.  However his daughter is in charge of his medicines.  Overall he is doing well without any major complaints. Knee osteoarthritis noted. Some disequilibrium. Uses a cane. No chest pain, no shortness of breath.  Daughter, [name], has been helping them for several years. She used to run an agency  that provided long-term care for people in home.    Past Medical History:  Diagnosis Date  . Arthritis    bil knees, right shoulder  . AVD (aortic valve disease)   . Chronic kidney disease    stage 3 kidney disease  . Complication of anesthesia    family states pt memory was bad 3-4 weeks after surgery  . Coronary atherosclerosis   . Fatigue   . Hyperlipidemia   . Hypertension   . Stroke [facility])    "I had a very milk sstroke one time the doctor said", current mini strokes  . Trigeminal neuralgia     Past Surgical History:  Procedure Laterality Date  . AORTIC VALVE REPLACEMENT     Carpentier-Edwards Pericardial Valve, MR Safe 1.5T  . CARDIAC CATHETERIZATION  06/22/1999   SEVERE HYPOKINESIA OF THE MID TO DISTAL INFERIOR WALL AND APEX.MODERATE LEFT VENTRICULAR DYSFUNCTION WITH EF 45%  . COLONOSCOPY N/A 01/12/2015   Procedure: COLONOSCOPY;  Surgeon: Wilford Corner, MD;  [facility] ENDOSCOPY;  Service: Endoscopy;  Laterality: N/A;  . CORONARY ARTERY BYPASS GRAFT  2001  . ESOPHAGOGASTRODUODENOSCOPY N/A 01/10/2015   Procedure: ESOPHAGOGASTRODUODENOSCOPY (EGD);  Surgeon: Clarene Essex, MD;  Location: [facility] ENDOSCOPY;  Service: Endoscopy;  Laterality: N/A;    Current Medications: Outpatient Medications Prior to Visit  Medication Sig Dispense Refill  . amLODipine (NORVASC) 2.5 MG tablet Take 1 tablet (2.5 mg total) by mouth daily. 90 tablet  3  . Calcium Carbonate-Vitamin D (CALCIUM 600 + D PO) Take 1 tablet by mouth at bedtime.     . carbamazepine (TEGRETOL) 200 MG tablet Take 1 tablet (200 mg total) by mouth 2 (two) times daily. 180 tablet 3  . clopidogrel (PLAVIX) 75 MG tablet Take 1 tablet (75 mg total) by mouth daily. 30 tablet 11  . CRANBERRY PO Take 4,200 mg by mouth 2 (two) times daily.    Marland Kitchen donepezil (ARICEPT) 10 MG tablet Take 1 tablet (10 mg total) by mouth at bedtime. 90 tablet 3  . ferrous sulfate 325 (65 FE) MG tablet Take 325 mg by mouth 3 (three) times daily with meals.      Marland Kitchen HYDROcodone-acetaminophen (NORCO) 7.5-325 MG tablet Take 1 tablet by mouth every 4 (four) hours as needed for moderate pain or severe pain (pain). 40 tablet 0  . losartan-hydrochlorothiazide (HYZAAR) 100-25 MG tablet Take 1 tablet by mouth daily. 90 tablet 3  . metoprolol (LOPRESSOR) 50 MG tablet Take 50 mg by mouth 2 (two) times daily.    . Misc Natural Products (OSTEO BI-FLEX ADV DOUBLE ST PO) Take 1 tablet by mouth 2 (two) times daily.    . Multiple Vitamin (MULTIVITAMIN) tablet Take 1 tablet by mouth daily.      . nitroGLYCERIN (NITROSTAT) 0.4 MG SL tablet Place 1 tablet (0.4 mg total) under the tongue every 5 (five) minutes as needed for chest pain. 25 tablet 3  . polyethylene glycol (MIRALAX / GLYCOLAX) packet Take 17 g by mouth daily.    . rosuvastatin (CRESTOR) 10 MG tablet Take 0.5 tablets (5 mg total) by mouth See admin instructions. Take 3 times a week on Tuesdays, Thursdays, Saturdays 20 tablet 1  . tamsulosin (FLOMAX) 0.4 MG CAPS capsule Take 0.4 mg by mouth daily with lunch.   11   No facility-administered medications prior to visit.      Allergies:   Gabapentin; Lipitor [atorvastatin calcium]; and Niaspan [niacin]   Social History   Social History  . Marital status: Married    Spouse name: Murray Hodgkins  . Number of children: 4  . Years of education: 12   Occupational History  .  Retired    retired   Social History Main Topics  . Smoking status: Former Smoker    Quit date: 09/13/1985  . Smokeless tobacco: Never Used  . Alcohol use No  . Drug use: No  . Sexual activity: Not Asked   Other Topics Concern  . None   Social History Narrative   Pt lives at home with his spouse. Michael Blanchard) Michael Blanchard   Caffeine Use: - Two cups of coffee daily and one pepsi daily.   Right handed.   Education- High school.     Family History:  The patient's family history includes Aneurysm in his father and mother; Heart disease in his brother; Stroke in his mother.   ROS:   Please see the  history of present illness.    ROS All other systems reviewed and are negative.   PHYSICAL EXAM:   VS:  BP (!) 148/70 (Cuff Size: Normal)   Pulse 60   Ht 5\' 6"  (1.676 m)   Wt 165 lb 6.4 oz (75 kg)   BMI 26.70 kg/m    GEN: Well nourished, well developed, in no acute distress  HEENT: normal  Neck: no JVD, carotid bruits, or masses Cardiac: RRR; Soft systolic murmurs, no rubs, or gallops,no edema  Respiratory:  clear to auscultation bilaterally, normal work  of breathing GI: soft, nontender, nondistended, + BS MS: no deformity or atrophy  Skin: warm and dry, no rash Neuro:  Alert and Oriented x 3, Strength and sensation are intact Psych: euthymic mood, full affect  Wt Readings from Last 3 Encounters:  10/10/15 165 lb 6.4 oz (75 kg)  05/07/15 159 lb (72.1 kg)  05/01/15 162 lb 6.4 oz (73.7 kg)      Studies/Labs Reviewed:   EKG: None today  Recent Labs: 01/07/2015: ALT 9; Brain Natriuretic Peptide 853.0; TSH 1.721 02/22/2015: Magnesium 2.0 05/01/2015: BUN 20; Creat 1.09; Hemoglobin 11.4; Platelets 190; Potassium 4.5; Sodium 138   Lipid Panel    Component Value Date/Time   CHOL 159 05/22/2012 1031   TRIG 131.0 05/22/2012 1031   HDL 43.80 05/22/2012 1031   CHOLHDL 4 05/22/2012 1031   VLDL 26.2 05/22/2012 1031   LDLCALC 89 05/22/2012 1031    Additional studies/ records that were reviewed today include:  Prior lab work, EKGs, office notes reviewed   ASSESSMENT:    1. S/P aortic valve replacement with bioprosthetic valve   2. Hx of CABG   3. History of colon cancer   4. Benign hypertensive heart disease without heart failure   5. Pure hypercholesterolemia   6. Coronary artery disease due to lipid rich plaque      PLAN:  In order of problems listed above:  Ischemic heart disease status post CABG in 2001  - No anginal symptoms  History of aortic valve replacement, tissue valve 2001  - Interestingly his wife had aortic valve replacement same year.  - EF 60%,  moderate LVH, normal bioprosthetic valve. Moderate MR  Essential hypertension without heart failure  - Stable  Previous history of severe anemia  - In October 2016, hemoglobin 6.4, cecal mass removed by Dr. Michael Boston. No postoperative chemotherapy was indicated.  Hyperlipidemia  - Followed by Dr. Delfina Redwood  Knee osteoarthritis  - Wearing knee braces. No surgery.  Medication Adjustments/Labs and Tests Ordered: Current medicines are reviewed at length with the patient today.  Concerns regarding medicines are outlined above.  Medication changes, Labs and Tests ordered today are listed in the Patient Instructions below. Patient Instructions  Medication Instructions:  The current medical regimen is effective;  continue present plan and medications.  Follow-Up: Follow up in 6 months with Dr. Marlou Porch.  You will receive a letter in the mail 2 months before you are due.  Please call us when you receive this letter to schedule your follow up appointment.  If you need a refill on your cardiac medications before your next appointment, please call your pharmacy.  Thank you for choosing [facility]!!         Signed, Candee Furbish, MD  10/10/2015 2:37 PM    [address], [place], [place]  [address] Phone: [phone]; Fax: [phone]

## 2015-10-10 NOTE — Patient Instructions (Signed)

## 2015-10-23 DIAGNOSIS — M25569 Pain in unspecified knee: Secondary | ICD-10-CM | POA: Diagnosis not present

## 2015-10-23 DIAGNOSIS — Z79891 Long term (current) use of opiate analgesic: Secondary | ICD-10-CM | POA: Diagnosis not present

## 2015-10-23 DIAGNOSIS — M25559 Pain in unspecified hip: Secondary | ICD-10-CM | POA: Diagnosis not present

## 2015-10-23 DIAGNOSIS — Z79899 Other long term (current) drug therapy: Secondary | ICD-10-CM | POA: Diagnosis not present

## 2015-10-23 DIAGNOSIS — G894 Chronic pain syndrome: Secondary | ICD-10-CM | POA: Diagnosis not present

## 2015-11-04 ENCOUNTER — Encounter: Payer: Self-pay | Admitting: Neurology

## 2015-11-04 ENCOUNTER — Ambulatory Visit (INDEPENDENT_AMBULATORY_CARE_PROVIDER_SITE_OTHER): Payer: Medicare Other | Admitting: Neurology

## 2015-11-04 DIAGNOSIS — H02035 Senile entropion of left lower eyelid: Secondary | ICD-10-CM | POA: Diagnosis not present

## 2015-11-04 DIAGNOSIS — I2510 Atherosclerotic heart disease of native coronary artery without angina pectoris: Secondary | ICD-10-CM | POA: Diagnosis not present

## 2015-11-04 DIAGNOSIS — G500 Trigeminal neuralgia: Secondary | ICD-10-CM

## 2015-11-04 DIAGNOSIS — Z961 Presence of intraocular lens: Secondary | ICD-10-CM | POA: Diagnosis not present

## 2015-11-04 MED ORDER — CARBAMAZEPINE 200 MG PO TABS: 200.0000 mg | ORAL_TABLET | Freq: Two times a day (BID) | ORAL | 4 refills | Status: DC

## 2015-11-04 NOTE — Progress Notes (Signed)
Chief Complaint  Patient presents with  . Trigeminal Neuralgia    No new concerns today.  His pain is well controlled on Tegretol 200mg , BID.   Subjective:    Patient ID: Michael Blanchard is a 82 y.o. male.  Michael Blanchard is a very pleasant 75 -year-old right-handed gentleman who presents for followup consultation of his memory loss, trigeminal neuralgia, and prior history of TIA.  He has past medical history of hypertension, hyperlipidemia, heart disease, status post aortic valve replacement and five-vessel CABG 25 years ago, trigeminal neuralgia, arthritis and memory loss.   He had a history of trigeminal neuralgia for more than 2 decades. He was first treated with gabapentin for this. He was then placed on carbamazepine. He started having memory problems.   In April 2013 he had a TIA with weakness and fall. He was on a baby aspirin daily and Plavix was added. 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 There was kinking of the left ICA. MRA brain showed diffuse atherosclerotic disease with severe narrowing of the bilateral internal carotid arteries at the siphon and moderate to severe stenosis of the inferior basilar artery and focal stenosis of the right PCA.   MRI brain showed mild diffuse and moderate mesial temporal atrophy and chronic left cerebellar ischemic infarct.  In January 2014 his MMSE was 28, clock drawing was 4, animal fluency was 15.  He states his trigeminal neuralgia is stable. He is taking carbamazepine 200 mg daily, he still has occasionally left upper eyelid, left nasal bridge shooting pain, he is also on frequent oxycodone, hydrocodone for his chronic bilateral knee pain, and low back pain, he is happy about current pain control,   Last Tegretol level was 3.4 in July 2014  UPDATE Jan 7th 2016: He drove himself to office today, he lives with his wife, with the help of his children, he has mild gait difficulty due to joints pain, he only has occasionally  recurrent left trigeminal facial pain, doing well on current Tegretol 200 mg twice a day, does not want to make any change, also need refill of his Plavix.  UPDATE May 07 2015: He had robotically assisted proximal RIGHT colectomy in December. 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 he complains of gait abnormality due to low back pain, bilateral knee pain  Review of system: As above  Past Medical History Is Significant For: Past Medical History:  Diagnosis Date  . Arthritis    bil knees, right shoulder  . AVD (aortic valve disease)   . Chronic kidney disease    stage 3 kidney disease  . Complication of anesthesia    family states pt memory was bad 3-4 weeks after surgery  . Coronary atherosclerosis   . Fatigue   . Hyperlipidemia   . Hypertension   . Stroke [facility])    "I had a very milk sstroke one time the doctor said", current mini strokes  . Trigeminal neuralgia     His Past Surgical History Is Significant For: Past Surgical History:  Procedure Laterality Date  . AORTIC VALVE REPLACEMENT     Carpentier-Edwards Pericardial Valve, MR Safe 1.5T  . CARDIAC CATHETERIZATION  06/22/1999  SEVERE HYPOKINESIA OF THE MID TO DISTAL INFERIOR WALL AND APEX.MODERATE LEFT VENTRICULAR DYSFUNCTION WITH EF 45%  . COLONOSCOPY N/A 01/12/2015   Procedure: COLONOSCOPY;  Surgeon: Wilford Corner, MD;  Location: [facility] ENDOSCOPY;  Service: Endoscopy;  Laterality: N/A;  . CORONARY ARTERY BYPASS GRAFT  2001  . ESOPHAGOGASTRODUODENOSCOPY N/A 01/10/2015   Procedure: ESOPHAGOGASTRODUODENOSCOPY (EGD);  Surgeon: Clarene Essex, MD;  Location[facility] ENDOSCOPY;  Service: Endoscopy;   Laterality: N/A;    His Family History Is Significant For: Family History  Problem Relation Age of Onset  . Stroke Mother   . Aneurysm Mother   . Aneurysm Father   . Heart disease Brother     His Social History Is Significant For: Social History   Social History  . Marital status: Married    Spouse name: Murray Hodgkins  . Number of children: 4  . Years of education: 12   Occupational History  .  Retired    retired   Social History Main Topics  . Smoking status: Former Smoker    Quit date: 09/13/1985  . Smokeless tobacco: Never Used  . Alcohol use No  . Drug use: No  . Sexual activity: Not Asked   Other Topics Concern  . None   Social History Narrative   Pt lives at home with his spouse. [name]) [name]   Caffeine Use: - Two cups of coffee daily and one pepsi daily.   Right handed.   Education- High school.    His Allergies Are:  Allergies  Allergen Reactions  . Gabapentin     Unknown rxn  . Lipitor [Atorvastatin Calcium]     Unknown rxn  . Niaspan [Niacin]     Unknown rxn  :   His Current Medications Are:  Outpatient Encounter Prescriptions as of 11/04/2015  Medication Sig Dispense Refill  . amLODipine (NORVASC) 2.5 MG tablet Take 1 tablet (2.5 mg total) by mouth daily. 90 tablet 3  . Calcium Carbonate-Vitamin D (CALCIUM 600 + D PO) Take 1 tablet by mouth at bedtime.     . carbamazepine (TEGRETOL) 200 MG tablet Take 1 tablet (200 mg total) by mouth 2 (two) times daily. 180 tablet 3  . clopidogrel (PLAVIX) 75 MG tablet Take 1 tablet (75 mg total) by mouth daily. 30 tablet 11  . CRANBERRY PO Take 4,200 mg by mouth 2 (two) times daily.    Marland Kitchen donepezil (ARICEPT) 10 MG tablet Take 1 tablet (10 mg total) by mouth at bedtime. 90 tablet 3  . ferrous sulfate 325 (65 FE) MG tablet Take 325 mg by mouth 3 (three) times daily with meals.    Marland Kitchen HYDROcodone-acetaminophen (NORCO) 7.5-325 MG tablet Take 1 tablet by mouth every 4 (four) hours as needed for moderate pain or severe pain  (pain). 40 tablet 0  . losartan-hydrochlorothiazide (HYZAAR) 100-25 MG tablet Take 1 tablet by mouth daily. 90 tablet 3  . metoprolol (LOPRESSOR) 50 MG tablet Take 50 mg by mouth 2 (two) times daily.    . Misc Natural Products (OSTEO BI-FLEX ADV DOUBLE ST PO) Take 1 tablet by mouth 2 (two) times daily.    . Multiple Vitamin (MULTIVITAMIN) tablet Take 1 tablet by mouth daily.      . nitroGLYCERIN (NITROSTAT) 0.4 MG SL tablet Place 1 tablet (0.4 mg total) under the tongue every 5 (five) minutes as needed for chest pain. 25 tablet 3  . polyethylene glycol (MIRALAX / GLYCOLAX) packet Take 17 g by mouth daily.    . rosuvastatin (CRESTOR) 10 MG  tablet Take 0.5 tablets (5 mg total) by mouth See admin instructions. Take 3 times a week on Tuesdays, Thursdays, Saturdays 20 tablet 1  . tamsulosin (FLOMAX) 0.4 MG CAPS capsule Take 0.4 mg by mouth daily with lunch.   11   No facility-administered encounter medications on file as of 11/04/2015.    Review of Systems Is positive for bilateral knee pain, gait difficulty,   PHYSICAL EXAMINATOINS:  Generalized: In no acute distress  Neck: Supple, no carotid bruits   Cardiac: Regular rate rhythm  Pulmonary: Clear to auscultation bilaterally  Musculoskeletal: No deformity  Neurological examination  Mentation: Alert oriented to time, place, history taking, and causual conversation, MMSE 30/30  Cranial nerve II-XII: Pupils were equal round reactive to light extraocular movements were full, visual field were full on confrontational test.  Bilateral fundi were sharp  Facial sensation and strength were normal. hearing was intact to finger rubbing bilaterally. Uvula tongue midline.  head turning and shoulder shrug and were normal and symmetric.Tongue protrusion into cheek strength was normal.  Motor: normal tone, bulk and strength.  Sensory: Intact to fine touch   Coordination: Normal finger to nose, heel-to-shin bilaterally there was no truncal  ataxia  Gait: atalgic, cautious gait.  Deep tendon reflexes: Brachioradialis 1/1, biceps 1/1, triceps 1/1, patellar absent, Achilles absent, plantar responses were flexor bilaterally.  Assessment and Plan:  82 years old Caucasian male, with past medical history of left trigeminal neuralgia, doing very well, with his current dose of carbamazepine, 200 mg twice daily, he is also has multiple vascular risk factors,  I have refilled his carbamazepine 200 mg twice a day  Marcial Pacas, [name] Ph.D.  [facility] [place],  [address] Phone: [phone] Fax:      [phone]

## 2015-11-10 DIAGNOSIS — H02035 Senile entropion of left lower eyelid: Secondary | ICD-10-CM | POA: Diagnosis not present

## 2015-11-26 DIAGNOSIS — M170 Bilateral primary osteoarthritis of knee: Secondary | ICD-10-CM | POA: Diagnosis not present

## 2015-12-08 DIAGNOSIS — C4441 Basal cell carcinoma of skin of scalp and neck: Secondary | ICD-10-CM | POA: Diagnosis not present

## 2015-12-08 DIAGNOSIS — L570 Actinic keratosis: Secondary | ICD-10-CM | POA: Diagnosis not present

## 2015-12-08 DIAGNOSIS — L821 Other seborrheic keratosis: Secondary | ICD-10-CM | POA: Diagnosis not present

## 2015-12-08 DIAGNOSIS — Z85828 Personal history of other malignant neoplasm of skin: Secondary | ICD-10-CM | POA: Diagnosis not present

## 2015-12-08 DIAGNOSIS — C44311 Basal cell carcinoma of skin of nose: Secondary | ICD-10-CM | POA: Diagnosis not present

## 2015-12-08 DIAGNOSIS — L812 Freckles: Secondary | ICD-10-CM | POA: Diagnosis not present

## 2015-12-08 DIAGNOSIS — D485 Neoplasm of uncertain behavior of skin: Secondary | ICD-10-CM | POA: Diagnosis not present

## 2015-12-23 DIAGNOSIS — C4441 Basal cell carcinoma of skin of scalp and neck: Secondary | ICD-10-CM | POA: Diagnosis not present

## 2015-12-23 DIAGNOSIS — Z85828 Personal history of other malignant neoplasm of skin: Secondary | ICD-10-CM | POA: Diagnosis not present

## 2016-01-06 ENCOUNTER — Other Ambulatory Visit: Payer: Self-pay

## 2016-01-06 MED ORDER — LOSARTAN POTASSIUM-HCTZ 100-25 MG PO TABS: 1.0000 | ORAL_TABLET | Freq: Every day | ORAL | 3 refills | Status: DC

## 2016-01-06 MED ORDER — METOPROLOL TARTRATE 50 MG PO TABS: 50.0000 mg | ORAL_TABLET | Freq: Two times a day (BID) | ORAL | 3 refills | Status: DC

## 2016-01-15 DIAGNOSIS — Z79899 Other long term (current) drug therapy: Secondary | ICD-10-CM | POA: Diagnosis not present

## 2016-01-15 DIAGNOSIS — Z79891 Long term (current) use of opiate analgesic: Secondary | ICD-10-CM | POA: Diagnosis not present

## 2016-01-15 DIAGNOSIS — M25569 Pain in unspecified knee: Secondary | ICD-10-CM | POA: Diagnosis not present

## 2016-01-15 DIAGNOSIS — L0889 Other specified local infections of the skin and subcutaneous tissue: Secondary | ICD-10-CM | POA: Diagnosis not present

## 2016-01-15 DIAGNOSIS — G894 Chronic pain syndrome: Secondary | ICD-10-CM | POA: Diagnosis not present

## 2016-01-15 DIAGNOSIS — M25559 Pain in unspecified hip: Secondary | ICD-10-CM | POA: Diagnosis not present

## 2016-01-28 DIAGNOSIS — Z961 Presence of intraocular lens: Secondary | ICD-10-CM | POA: Diagnosis not present

## 2016-02-26 ENCOUNTER — Ambulatory Visit (INDEPENDENT_AMBULATORY_CARE_PROVIDER_SITE_OTHER): Payer: Medicare Other | Admitting: Orthopaedic Surgery

## 2016-02-26 DIAGNOSIS — I2583 Coronary atherosclerosis due to lipid rich plaque: Secondary | ICD-10-CM

## 2016-02-26 DIAGNOSIS — M1711 Unilateral primary osteoarthritis, right knee: Secondary | ICD-10-CM | POA: Diagnosis not present

## 2016-02-26 DIAGNOSIS — M25562 Pain in left knee: Secondary | ICD-10-CM

## 2016-02-26 DIAGNOSIS — M25561 Pain in right knee: Secondary | ICD-10-CM

## 2016-02-26 DIAGNOSIS — I2510 Atherosclerotic heart disease of native coronary artery without angina pectoris: Secondary | ICD-10-CM | POA: Diagnosis not present

## 2016-02-26 DIAGNOSIS — M1712 Unilateral primary osteoarthritis, left knee: Secondary | ICD-10-CM

## 2016-02-26 DIAGNOSIS — G8929 Other chronic pain: Secondary | ICD-10-CM

## 2016-02-26 MED ORDER — LIDOCAINE HCL 1 % IJ SOLN
3.0000 mL | INTRAMUSCULAR | Status: AC | PRN
  Administered 2016-02-26: 3 mL

## 2016-02-26 MED ORDER — METHYLPREDNISOLONE ACETATE 40 MG/ML IJ SUSP
40.0000 mg | INTRAMUSCULAR | Status: AC | PRN
  Administered 2016-02-26: 40 mg via INTRA_ARTICULAR

## 2016-02-26 NOTE — Progress Notes (Signed)
Office Visit Note   Patient: Michael Blanchard           Date of Birth: [birth year]           MRN: [id number] Visit Date: 02/26/2016              Requested by: Seward Carol, MD [address] [place], [place] [address] PCP: [name], MD   Assessment & Plan: Visit Diagnoses:  1. Chronic pain of left knee   2. Chronic pain of right knee   3. Unilateral primary osteoarthritis, left knee   4. Unilateral primary osteoarthritis, right knee     Plan: He tolerated the injections well both his knees. We'll see him back for repeat injections in 3 months. I recommended trying copper fit knee braces. Also continue to recommend knee replacement surgery.  Follow-Up Instructions: Return in about 3 months (around 05/26/2016).   Orders:  No orders of the defined types were placed in this encounter.  No orders of the defined types were placed in this encounter.     Procedures: Large Joint Inj Date/Time: 02/26/2016 1:30 PM Performed by: Mcarthur Rossetti Authorized by: Jean Rosenthal Y   Location:  Knee Site:  R knee Ultrasound Guidance: No   Fluoroscopic Guidance: No   Arthrogram: No   Medications:  3 mL lidocaine 1 %; 40 mg methylPREDNISolone acetate 40 MG/ML Large Joint Inj Date/Time: 02/26/2016 1:31 PM Performed by: Mcarthur Rossetti Authorized by: Mcarthur Rossetti   Location:  Knee Site:  L knee Ultrasound Guidance: No   Fluoroscopic Guidance: No   Arthrogram: No   Medications:  3 mL lidocaine 1 %; 40 mg methylPREDNISolone acetate 40 MG/ML     Clinical Data: No additional findings.   Subjective: Chief Complaint  Patient presents with  . Left Knee - Follow-up    Patient wants cortisone injections today  . Right Knee - Follow-up   HPI The patient is well-known to me. He has severe osteoarthritis in both of his knees. He gets injections of steroid about every 3 months. He wears knee braces well. He is in chronic pain  management. Review of Systems Glendell Docker he denies any headache, shortness of breath, fever, chills, nausea, vomiting or chest pain  Objective: Vital Signs: There were no vitals taken for this visit.  Physical Exam He is alert and oriented 3. Ortho Exam Both knees are examined and show good range of motion. There severe patellofemoral crepitation and slight varus for a both knees. Both knees have significant bony swelling but not a large effusion this is due to the severity of his tricompartmental osteoarthritis. Specialty Comments:  No specialty comments available.  Imaging: No results found.   PMFS History: Patient Active Problem List   Diagnosis Date Noted  . Chronic anticoagulation for mechanical heart valve 02/19/2015  . Cancer of ascending colon ([place]) 02/19/2015  . Cancer of cecum    . Hypochromic microcytic anemia   . CKD (chronic kidney disease) stage 3, GFR 30-59 ml/min 01/08/2015  . Essential hypertension 01/08/2015  . Symptomatic anemia 01/07/2015  . Anemia 01/07/2015  . Trigeminal neuralgia 03/21/2013  . Malaise and fatigue 01/25/2011  . Benign hypertensive heart disease without heart failure 09/24/2010  . Hx of CABG 09/24/2010  . S/P aortic valve replacement with bioprosthetic valve 09/24/2010  . Hypercholesterolemia 09/24/2010  . Osteoarthritis 09/24/2010   Past Medical History:  Diagnosis Date  . Arthritis    bil knees, right shoulder  . AVD (  aortic valve disease)   . Chronic kidney disease    stage 3 kidney disease  . Complication of anesthesia    family states pt memory was bad 3-4 weeks after surgery  . Coronary atherosclerosis   . Fatigue   . Hyperlipidemia   . Hypertension   . Stroke [facility])    "I had a very milk sstroke one time the doctor said", current mini strokes  . Trigeminal neuralgia     Family History  Problem Relation Age of Onset  . Stroke Mother   . Aneurysm Mother   . Aneurysm Father   . Heart disease Brother     Past Surgical  History:  Procedure Laterality Date  . AORTIC VALVE REPLACEMENT     Carpentier-Edwards Pericardial Valve, MR Safe 1.5T  . CARDIAC CATHETERIZATION  06/22/1999   SEVERE HYPOKINESIA OF THE MID TO DISTAL INFERIOR WALL AND APEX.MODERATE LEFT VENTRICULAR DYSFUNCTION WITH EF 45%  . COLONOSCOPY N/A 01/12/2015   Procedure: COLONOSCOPY;  Surgeon: Wilford Corner, MD;  Location: [facility] ENDOSCOPY;  Service: Endoscopy;  Laterality: N/A;  . CORONARY ARTERY BYPASS GRAFT  2001  . ESOPHAGOGASTRODUODENOSCOPY N/A 01/10/2015   Procedure: ESOPHAGOGASTRODUODENOSCOPY (EGD);  Surgeon: Clarene Essex, MD;  Location: [facility] ENDOSCOPY;  Service: Endoscopy;  Laterality: N/A;   Social History   Occupational History  .  Retired    retired   Social History Main Topics  . Smoking status: Former Smoker    Quit date: 09/13/1985  . Smokeless tobacco: Never Used  . Alcohol use No  . Drug use: No  . Sexual activity: Not on file

## 2016-03-04 ENCOUNTER — Emergency Department (HOSPITAL_COMMUNITY)
Admission: EM | Admit: 2016-03-04 | Discharge: 2016-03-04 | Disposition: A | Discharge status: Home / Self Care | Payer: Medicare Other | Attending: Emergency Medicine | Admitting: Emergency Medicine

## 2016-03-04 ENCOUNTER — Emergency Department (HOSPITAL_COMMUNITY): Payer: Medicare Other

## 2016-03-04 ENCOUNTER — Encounter (HOSPITAL_COMMUNITY): Payer: Self-pay | Admitting: Emergency Medicine

## 2016-03-04 DIAGNOSIS — W19XXXA Unspecified fall, initial encounter: Secondary | ICD-10-CM

## 2016-03-04 DIAGNOSIS — S0083XA Contusion of other part of head, initial encounter: Secondary | ICD-10-CM | POA: Diagnosis not present

## 2016-03-04 DIAGNOSIS — S82002A Unspecified fracture of left patella, initial encounter for closed fracture: Secondary | ICD-10-CM

## 2016-03-04 DIAGNOSIS — Z951 Presence of aortocoronary bypass graft: Secondary | ICD-10-CM | POA: Insufficient documentation

## 2016-03-04 DIAGNOSIS — S8002XA Contusion of left knee, initial encounter: Secondary | ICD-10-CM | POA: Diagnosis not present

## 2016-03-04 DIAGNOSIS — Z8673 Personal history of transient ischemic attack (TIA), and cerebral infarction without residual deficits: Secondary | ICD-10-CM | POA: Diagnosis not present

## 2016-03-04 DIAGNOSIS — M25569 Pain in unspecified knee: Secondary | ICD-10-CM | POA: Diagnosis not present

## 2016-03-04 DIAGNOSIS — Z85038 Personal history of other malignant neoplasm of large intestine: Secondary | ICD-10-CM | POA: Diagnosis not present

## 2016-03-04 DIAGNOSIS — I129 Hypertensive chronic kidney disease with stage 1 through stage 4 chronic kidney disease, or unspecified chronic kidney disease: Secondary | ICD-10-CM | POA: Diagnosis not present

## 2016-03-04 DIAGNOSIS — Z87891 Personal history of nicotine dependence: Secondary | ICD-10-CM | POA: Diagnosis not present

## 2016-03-04 DIAGNOSIS — N183 Chronic kidney disease, stage 3 (moderate): Secondary | ICD-10-CM | POA: Diagnosis not present

## 2016-03-04 DIAGNOSIS — S82092A Other fracture of left patella, initial encounter for closed fracture: Secondary | ICD-10-CM | POA: Insufficient documentation

## 2016-03-04 DIAGNOSIS — Y939 Activity, unspecified: Secondary | ICD-10-CM | POA: Diagnosis not present

## 2016-03-04 DIAGNOSIS — M25562 Pain in left knee: Secondary | ICD-10-CM | POA: Diagnosis not present

## 2016-03-04 DIAGNOSIS — Y9289 Other specified places as the place of occurrence of the external cause: Secondary | ICD-10-CM | POA: Insufficient documentation

## 2016-03-04 DIAGNOSIS — W010XXA Fall on same level from slipping, tripping and stumbling without subsequent striking against object, initial encounter: Secondary | ICD-10-CM | POA: Diagnosis not present

## 2016-03-04 DIAGNOSIS — Y999 Unspecified external cause status: Secondary | ICD-10-CM | POA: Insufficient documentation

## 2016-03-04 DIAGNOSIS — S42415A Nondisplaced simple supracondylar fracture without intercondylar fracture of left humerus, initial encounter for closed fracture: Secondary | ICD-10-CM | POA: Insufficient documentation

## 2016-03-04 DIAGNOSIS — S8992XA Unspecified injury of left lower leg, initial encounter: Secondary | ICD-10-CM | POA: Diagnosis not present

## 2016-03-04 DIAGNOSIS — S42412A Displaced simple supracondylar fracture without intercondylar fracture of left humerus, initial encounter for closed fracture: Secondary | ICD-10-CM

## 2016-03-04 MED ORDER — OXYCODONE-ACETAMINOPHEN 5-325 MG PO TABS
1.0000 | ORAL_TABLET | Freq: Once | ORAL | Status: AC
  Administered 2016-03-04: 1 via ORAL
  Filled 2016-03-04: qty 1

## 2016-03-04 NOTE — Progress Notes (Signed)
ED CM consulted by EDP, Campos for home health services for pt  Cm spoke with pt, his son and male visitor and his daughter, Leveon Buth via pt's son cell phone CM reviewed in details medicare guidelines, Choices of home health [facility]) (length of stay in home, types of [facility] staff available, coverage, primary caregiver, up to 24 hrs before services may be started) and choices of Private duty nursing (PDN-coverage, length of stay in the home types of staff available). CM provided pt/family with a list of [place] home health agencies and PDN.  Olin Hauser states pt and his blind wife fell recently and the wife is a nursing home now getting rehab.  Pt states he lives alone Son states pt can not stay with him "clutter" and he could not stay with father 24 hours.  CM again discussed PDN, out of pocket for facility and Cm being informed pt is medically cleared for d/c home with home health. Pt informs Cm and family he has a friend and neighbors that can stay with and assist him when home health agency staff present and not present. CM answered questions and left pt and his family to discuss their options and choice of care. ED Rn inquired about Plan Informed her EDP requesting home health and family now discussing their options of care  Pt has 2 walkers, an elevated toilet seat at home Pt is alert and oriented x 3 tells Cm his name, name of family, situation and place

## 2016-03-04 NOTE — ED Notes (Signed)
Case Management at bedside.

## 2016-03-04 NOTE — ED Notes (Signed)
Ortho tech at bedside 

## 2016-03-04 NOTE — ED Notes (Signed)
Bed: [id number] Expected date:  Expected time:  Means of arrival:  Comments: 82 yo M fall, elbow and knee pain

## 2016-03-04 NOTE — Progress Notes (Signed)
Fax confirmation for fax clinical for [facility], Aide, Sw from [facility] Cm spoke with pt about mobility when Cm noted ED Rn with W/c outside of his room  Pt reports having difficulty getting in and out of his son car on yesterday with assist from a neighbor's husband "big guy" Now pt with cast on left arm and immobilizer on left knee Cm discussed transport via EMS Pt came to ED via EMS Pt agreed EMS transport would be less taxing  Cm discussed transport with ED RN, son and male visitor Daughter worked for a [employer], another daughter not locally per son

## 2016-03-04 NOTE — ED Notes (Signed)
Patient transported to CT 

## 2016-03-04 NOTE — ED Provider Notes (Signed)
[facility] DEPT Provider Note   CSN: [id number] Arrival date & time: 03/04/16  1155     History   Chief Complaint Chief Complaint  Patient presents with  . Fall  . Knee Pain    HPI Michael Blanchard is a 82 y.o. male.  HPI Patient ports tripping and falling 3 times in the last several days.  He injured his left elbow while visiting his spouse at the rehabilitation center yesterday.  His been unable to bend his left elbow secondary to severe pain.  He also fell earlier today onto his bilateral knees but is reporting significant pain and pain with range of motion of the left knee..  Mild head injury several days ago but did not lose consciousness.  He has no headache at this time.  No confusion per family.  No other complaints.   Past Medical History:  Diagnosis Date  . Arthritis    bil knees, right shoulder  . AVD (aortic valve disease)   . Chronic kidney disease    stage 3 kidney disease  . Complication of anesthesia    family states pt memory was bad 3-4 weeks after surgery  . Coronary atherosclerosis   . Fatigue   . Hyperlipidemia   . Hypertension   . Stroke [facility])    "I had a very milk sstroke one time the doctor said", current mini strokes  . Trigeminal neuralgia     Patient Active Problem List   Diagnosis Date Noted  . Chronic anticoagulation for mechanical heart valve 02/19/2015  . Cancer of ascending colon ([place]) 02/19/2015  . Cancer of cecum    . Hypochromic microcytic anemia   . CKD (chronic kidney disease) stage 3, GFR 30-59 ml/min 01/08/2015  . Essential hypertension 01/08/2015  . Symptomatic anemia 01/07/2015  . Anemia 01/07/2015  . Trigeminal neuralgia 03/21/2013  . Malaise and fatigue 01/25/2011  . Benign hypertensive heart disease without heart failure 09/24/2010  . Hx of CABG 09/24/2010  . S/P aortic valve replacement with bioprosthetic valve 09/24/2010  . Hypercholesterolemia 09/24/2010  . Osteoarthritis 09/24/2010    Past Surgical History:   Procedure Laterality Date  . AORTIC VALVE REPLACEMENT     Carpentier-Edwards Pericardial Valve, MR Safe 1.5T  . CARDIAC CATHETERIZATION  06/22/1999   SEVERE HYPOKINESIA OF THE MID TO DISTAL INFERIOR WALL AND APEX.MODERATE LEFT VENTRICULAR DYSFUNCTION WITH EF 45%  . COLONOSCOPY N/A 01/12/2015   Procedure: COLONOSCOPY;  Surgeon: Wilford Corner, MD;  Location: [facility] ENDOSCOPY;  Service: Endoscopy;  Laterality: N/A;  . CORONARY ARTERY BYPASS GRAFT  2001  . ESOPHAGOGASTRODUODENOSCOPY N/A 01/10/2015   Procedure: ESOPHAGOGASTRODUODENOSCOPY (EGD);  Surgeon: Clarene Essex, MD;  Location: [facility] ENDOSCOPY;  Service: Endoscopy;  Laterality: N/A;       Home Medications    Prior to Admission medications   Medication Sig Start Date End Date Taking? Authorizing Provider  amLODipine (NORVASC) 2.5 MG tablet Take 1 tablet (2.5 mg total) by mouth daily. 07/31/15  Yes Jerline Pain, MD  Calcium Carbonate-Vitamin D (CALCIUM 600 + D PO) Take 1 tablet by mouth at bedtime.    Yes Historical Provider, MD  carbamazepine (TEGRETOL) 200 MG tablet Take 1 tablet (200 mg total) by mouth 2 (two) times daily. 11/04/15  Yes Marcial Pacas, MD  clopidogrel (PLAVIX) 75 MG tablet Take 1 tablet (75 mg total) by mouth daily. 08/05/15  Yes Marcial Pacas, MD  CRANBERRY PO Take 4,200 mg by mouth 2 (two) times daily.   Yes Historical Provider, MD  donepezil (  ARICEPT) 10 MG tablet Take 1 tablet (10 mg total) by mouth at bedtime. 05/29/15  Yes Marcial Pacas, MD  HYDROcodone-acetaminophen (NORCO) 7.5-325 MG tablet Take 1 tablet by mouth every 4 (four) hours as needed for moderate pain or severe pain (pain). Patient taking differently: Take 1 tablet by mouth every 8 (eight) hours.  02/19/15  Yes Michael Boston, MD  losartan-hydrochlorothiazide (HYZAAR) 100-25 MG tablet Take 1 tablet by mouth daily. 01/06/16  Yes Jerline Pain, MD  metoprolol (LOPRESSOR) 50 MG tablet Take 1 tablet (50 mg total) by mouth 2 (two) times daily. 01/06/16  Yes Jerline Pain, MD  Misc  Natural Products (OSTEO BI-FLEX ADV DOUBLE ST PO) Take 1 tablet by mouth 2 (two) times daily.   Yes Historical Provider, MD  Multiple Vitamins-Minerals (CENTRUM SILVER 50+MEN PO) Take 1 tablet by mouth daily.   Yes Historical Provider, MD  nitroGLYCERIN (NITROSTAT) 0.4 MG SL tablet Place 1 tablet (0.4 mg total) under the tongue every 5 (five) minutes as needed for chest pain. 01/07/15  Yes Burtis Junes, NP  polyethylene glycol (MIRALAX / [place]) packet Take 17 g by mouth daily as needed for moderate constipation.    Yes Historical Provider, MD  rosuvastatin (CRESTOR) 10 MG tablet Take 0.5 tablets (5 mg total) by mouth See admin instructions. Take 3 times a week on Tuesdays, Thursdays, Saturdays Patient taking differently: Take 5 mg by mouth 3 (three) times a week. Take 3 times a week on Tuesdays, Thursdays, Saturdays 04/28/15  Yes Darlin Coco, MD  sennosides-docusate sodium (SENOKOT-S) 8.6-50 MG tablet Take 1 tablet by mouth 3 (three) times daily.   Yes Historical Provider, MD  tamsulosin (FLOMAX) 0.4 MG CAPS capsule Take 0.4 mg by mouth daily with lunch.  01/07/14  Yes Historical Provider, MD    Family History Family History  Problem Relation Age of Onset  . Stroke Mother   . Aneurysm Mother   . Aneurysm Father   . Heart disease Brother     Social History Social History  Substance Use Topics  . Smoking status: Former Smoker    Quit date: 09/13/1985  . Smokeless tobacco: Never Used  . Alcohol use No     Allergies   Gabapentin; Lipitor [atorvastatin calcium]; and Niaspan [niacin]   Review of Systems Review of Systems  All other systems reviewed and are negative.    Physical Exam Updated Vital Signs BP 160/97   Pulse 74   Temp 98.5 F (36.9 C) (Oral)   Resp 22   SpO2 97%   Physical Exam  Constitutional: He is oriented to person, place, and time. He appears well-developed and well-nourished.  HENT:  Head: Normocephalic.  Small abrasion) program with ecchymosis.   No significant soft tissue swelling.  Eyes: EOM are normal.  Neck: Normal range of motion. Neck supple.  No C-spine tenderness  Cardiovascular: Normal rate, regular rhythm, normal heart sounds and intact distal pulses.   Pulmonary/Chest: Effort normal and breath sounds normal. No respiratory distress. He exhibits no tenderness.  Abdominal: Soft. He exhibits no distension. There is no tenderness.  Musculoskeletal:  Limited range of motion of the left elbow secondary to pain.  No tenderness over the left clavicle.  Full range of motion of left wrist.  Normal left radial pulse.  Normal grip strength left hand.  Limited range of motion of left elbow with tenderness over the left medial upper condyle.  Left elbow swelling noted.  Full range of motion of the right shoulder, right  elbow, right wrist.  Full range of motion of the right hip, right knee, right ankle.  Full range of motion of the left hip however limited range of motion of the left knee with obvious associated ecchymosis and swelling of the left knee.  Normal pulses in left foot.  Neurological: He is alert and oriented to person, place, and time.  Skin: Skin is warm and dry.  Psychiatric: He has a normal mood and affect. Judgment normal.  Nursing note and vitals reviewed.    ED Treatments / Results  Labs (all labs ordered are listed, but only abnormal results are displayed) Labs Reviewed - No data to display  EKG  EKG Interpretation None       Radiology Dg Elbow Complete Left  Result Date: 03/04/2016 CLINICAL DATA:  Fall yesterday with left elbow pain. EXAM: LEFT ELBOW - COMPLETE 3+ VIEW COMPARISON:  None. FINDINGS: Nondisplaced supracondylar fracture in the left distal humerus with left elbow joint effusion. No additional fracture. No dislocation. No suspicious focal osseous lesion. Moderate osteoarthritis in the left elbow joint. Small enthesophyte at the posterior left olecranon. No radiopaque foreign body. IMPRESSION:  Nondisplaced supracondylar fracture in the left distal humerus with left elbow joint effusion. No dislocation in the left elbow. Electronically Signed   By: Ilona Sorrel [name]   On: 03/04/2016 14:01   Ct Knee Left Wo Contrast  Result Date: 03/04/2016 CLINICAL DATA:  Status post fall last night. EXAM: CT OF THE LEFT KNEE WITHOUT CONTRAST TECHNIQUE: Multidetector CT imaging of the LEFT knee was performed according to the standard protocol. Multiplanar CT image reconstructions were also generated. COMPARISON:  None. FINDINGS: Bones/Joint/Cartilage Generalized osteopenia. Vertically-oriented fracture involving the lateral aspect of the patella and the articular surface without displacement or angulation. No other fracture or dislocation. Severe tricompartmental osteoarthritis of the left knee with joint space narrowing bulky marginal osteophytes. Chondrocalcinosis of the medial and lateral femorotibial compartments as can be seen with CPPD. Large joint effusion. No Baker cyst. Ligaments Suboptimally assessed by CT. Muscles and Tendons Muscles are normal. No significant muscle atrophy. Quadriceps tendon and patellar tendon are intact. Soft tissues No fluid collection or hematoma. Peripheral vascular atherosclerotic disease. IMPRESSION: 1. Nondisplaced vertically oriented fracture involving the lateral aspect of the patella and involving the articular surface of the lateral patellar facet. 2. Severe tricompartmental osteoarthritis of the knee. Electronically Signed   By: Kathreen Devoid   On: 03/04/2016 15:44   Dg Knee Complete 4 Views Left  Result Date: 03/04/2016 CLINICAL DATA:  Status post fall.  Diffuse left knee pain. EXAM: LEFT KNEE - COMPLETE 4+ VIEW COMPARISON:  None. FINDINGS: No acute fracture or dislocation. Severe osteopenia. Severe osteoarthritis of the patellofemoral compartment. Moderate osteoarthritis of the medial and lateral femorotibial compartments. Chondrocalcinosis of the medial and lateral  femorotibial compartments as can be seen with CPPD. Bulky tricompartmental marginal osteophytes. Large joint effusion. Peripheral vascular atherosclerotic disease. IMPRESSION: 1. No acute osseous injury of the left knee. Given the patient's age and osteopenia, if there is persistent clinical concern for an occult fracture, a CT or MRI of the knee is recommended for increased sensitivity. Electronically Signed   By: Kathreen Devoid   On: 03/04/2016 14:01    Procedures Procedures (including critical care time)  Medications Ordered in ED Medications  oxyCODONE-acetaminophen (PERCOCET/ROXICET) 5-325 MG per tablet 1 tablet (1 tablet Oral Given 03/04/16 1428)     Initial Impression / Assessment and Plan / ED Course  I have reviewed the triage  vital signs and the nursing notes.  Pertinent labs & imaging results that were available during my care of the patient were reviewed by me and considered in my medical decision making (see chart for details).  Clinical Course     Supracondylar fracture of the left humerus.  Placed in a posterior splint.  Orthopedic follow-up.  Initial plain films of left knee without obvious osseous abnormality however CT performed which demonstrates left patella fracture.  Placed in a knee immobilizer.  Patient will be nonweightbearing left upper and left lower extremity.  He has a bedside commode and a shower chair at home.  He has a wheelchair at home.  He has family available.  Case management will be involved as well to provide additional resources for home.  I do not think the patient needs to be acutely admitted to the hospital.  Outpatient orthopedic follow-up.  His outpatient orthopedist is Dr. Zollie Beckers.  He understands to return to the emergency department for new or worsening symptoms  Final Clinical Impressions(s) / ED Diagnoses   Final diagnoses:  Fall, initial encounter  Supracondylar fracture of humerus, left, closed, initial encounter  Closed  nondisplaced fracture of left patella, unspecified fracture morphology, initial encounter    New Prescriptions New Prescriptions   No medications on file     Jola Schmidt, MD 03/04/16 1657

## 2016-03-04 NOTE — ED Triage Notes (Addendum)
Patient here from home with complaints of fall last night. Reports left knee pain and left elbow. Pain 7/10. Denies hitting head, no LOC. Has walker, "I don't need it".

## 2016-03-04 NOTE — Progress Notes (Signed)
ED CM updated EDP family are discussing what to do at this time and name of agency is pending to complete home health referral EDP states he discussed same concerns with pt, son and male visitor

## 2016-03-04 NOTE — Progress Notes (Signed)
Pt son on telephone when Cm returned to pt room and states Olin Hauser and family choice of home health agency is [facility] because they have [facility]  CM sent referral to [facility] via fax

## 2016-03-07 DIAGNOSIS — I2510 Atherosclerotic heart disease of native coronary artery without angina pectoris: Secondary | ICD-10-CM | POA: Diagnosis not present

## 2016-03-07 DIAGNOSIS — S42412D Displaced simple supracondylar fracture without intercondylar fracture of left humerus, subsequent encounter for fracture with routine healing: Secondary | ICD-10-CM | POA: Diagnosis not present

## 2016-03-07 DIAGNOSIS — Z8673 Personal history of transient ischemic attack (TIA), and cerebral infarction without residual deficits: Secondary | ICD-10-CM | POA: Diagnosis not present

## 2016-03-07 DIAGNOSIS — S82002D Unspecified fracture of left patella, subsequent encounter for closed fracture with routine healing: Secondary | ICD-10-CM | POA: Diagnosis not present

## 2016-03-07 DIAGNOSIS — Z9181 History of falling: Secondary | ICD-10-CM | POA: Diagnosis not present

## 2016-03-07 DIAGNOSIS — Z952 Presence of prosthetic heart valve: Secondary | ICD-10-CM | POA: Diagnosis not present

## 2016-03-07 DIAGNOSIS — I129 Hypertensive chronic kidney disease with stage 1 through stage 4 chronic kidney disease, or unspecified chronic kidney disease: Secondary | ICD-10-CM | POA: Diagnosis not present

## 2016-03-07 DIAGNOSIS — N183 Chronic kidney disease, stage 3 (moderate): Secondary | ICD-10-CM | POA: Diagnosis not present

## 2016-03-07 DIAGNOSIS — E785 Hyperlipidemia, unspecified: Secondary | ICD-10-CM | POA: Diagnosis not present

## 2016-03-07 DIAGNOSIS — M170 Bilateral primary osteoarthritis of knee: Secondary | ICD-10-CM | POA: Diagnosis not present

## 2016-03-07 DIAGNOSIS — D649 Anemia, unspecified: Secondary | ICD-10-CM | POA: Diagnosis not present

## 2016-03-07 DIAGNOSIS — Z85038 Personal history of other malignant neoplasm of large intestine: Secondary | ICD-10-CM | POA: Diagnosis not present

## 2016-03-09 DIAGNOSIS — S42412D Displaced simple supracondylar fracture without intercondylar fracture of left humerus, subsequent encounter for fracture with routine healing: Secondary | ICD-10-CM | POA: Diagnosis not present

## 2016-03-09 DIAGNOSIS — S82002D Unspecified fracture of left patella, subsequent encounter for closed fracture with routine healing: Secondary | ICD-10-CM | POA: Diagnosis not present

## 2016-03-09 DIAGNOSIS — M170 Bilateral primary osteoarthritis of knee: Secondary | ICD-10-CM | POA: Diagnosis not present

## 2016-03-09 DIAGNOSIS — I2510 Atherosclerotic heart disease of native coronary artery without angina pectoris: Secondary | ICD-10-CM | POA: Diagnosis not present

## 2016-03-09 DIAGNOSIS — I129 Hypertensive chronic kidney disease with stage 1 through stage 4 chronic kidney disease, or unspecified chronic kidney disease: Secondary | ICD-10-CM | POA: Diagnosis not present

## 2016-03-09 DIAGNOSIS — N183 Chronic kidney disease, stage 3 (moderate): Secondary | ICD-10-CM | POA: Diagnosis not present

## 2016-03-10 ENCOUNTER — Telehealth (INDEPENDENT_AMBULATORY_CARE_PROVIDER_SITE_OTHER): Payer: Self-pay | Admitting: Orthopaedic Surgery

## 2016-03-10 ENCOUNTER — Ambulatory Visit (INDEPENDENT_AMBULATORY_CARE_PROVIDER_SITE_OTHER): Payer: Medicare Other | Admitting: Orthopedic Surgery

## 2016-03-10 DIAGNOSIS — I2510 Atherosclerotic heart disease of native coronary artery without angina pectoris: Secondary | ICD-10-CM | POA: Diagnosis not present

## 2016-03-10 DIAGNOSIS — M170 Bilateral primary osteoarthritis of knee: Secondary | ICD-10-CM | POA: Diagnosis not present

## 2016-03-10 DIAGNOSIS — S42412D Displaced simple supracondylar fracture without intercondylar fracture of left humerus, subsequent encounter for fracture with routine healing: Secondary | ICD-10-CM | POA: Diagnosis not present

## 2016-03-10 DIAGNOSIS — N183 Chronic kidney disease, stage 3 (moderate): Secondary | ICD-10-CM | POA: Diagnosis not present

## 2016-03-10 DIAGNOSIS — I129 Hypertensive chronic kidney disease with stage 1 through stage 4 chronic kidney disease, or unspecified chronic kidney disease: Secondary | ICD-10-CM | POA: Diagnosis not present

## 2016-03-10 DIAGNOSIS — S82002D Unspecified fracture of left patella, subsequent encounter for closed fracture with routine healing: Secondary | ICD-10-CM | POA: Diagnosis not present

## 2016-03-10 NOTE — Telephone Encounter (Signed)
See message below °

## 2016-03-10 NOTE — Telephone Encounter (Signed)
Patient's son canceled appointment with Aaron Edelman today due to transportation issues (patient would have to come via EMS and he does not qualify for that). Patient's son would like to touch base about this and says patient does qualify for mobile xrays if that is what is needed. Please call patient's son at [phone]

## 2016-03-11 ENCOUNTER — Ambulatory Visit (INDEPENDENT_AMBULATORY_CARE_PROVIDER_SITE_OTHER): Payer: Medicare Other | Admitting: Orthopaedic Surgery

## 2016-03-11 ENCOUNTER — Telehealth (INDEPENDENT_AMBULATORY_CARE_PROVIDER_SITE_OTHER): Payer: Self-pay | Admitting: Orthopaedic Surgery

## 2016-03-11 DIAGNOSIS — S82015A Nondisplaced osteochondral fracture of left patella, initial encounter for closed fracture: Secondary | ICD-10-CM | POA: Diagnosis not present

## 2016-03-11 DIAGNOSIS — S42415A Nondisplaced simple supracondylar fracture without intercondylar fracture of left humerus, initial encounter for closed fracture: Secondary | ICD-10-CM

## 2016-03-11 NOTE — Telephone Encounter (Signed)
Pt was seen today. Pt has broken knee cap, pt son called to tell you that pt previously refused knee replacement, but is now considering this. Pt son requested a call back.    Wilber Oliphant, son [phone]

## 2016-03-11 NOTE — Progress Notes (Signed)
Mr. Dacosta is a 82 year old gentleman well known to me. He sustained a mechanical fall on 03/03/2016 and went to the emergency room. CT scan confirmed a nondisplaced patella fracture is placed in a knee immobilizer and told to be nonweightbearing. He also has a nondisplaced distal humerus fracture left elbow and is in a splint. Denies a nubs and tingling in his foot or in his left hand. He stays at home and his son is helping him get around in a wheelchair into a bedside commode. Home health therapist coming twice a week. He denies any other injuries. He denies any significant pain but he does take hydrocodone regularly.  Examination of his left upper extremity is in a well-padded splint and his fingers are well perfused. He is in a good position of the elbow in the splint. I did review x-rays of the left elbow and we'll my interpretation does show a nondisplaced collar humerus fracture without intercondylar extension.  Examination of his left knee shows bruising over the patella. The patella itself was intact and the extensor mechanism is intact. The CT scan and plain films reviewed and does show severe tricompartmental arthritis of his knee as well as a nondisplaced signal through the patella. This is consistent with amounts placed fracture.  At this point we will let him weight-bear as tolerated with a knee immobilizer on the left leg. He'll not bend his knee at all until we see him back in the office in 2 weeks. He'll also remain in the splint on his left upper extremity. He can try a platform walker and I gave notes the therapy to attest that. He can also try to continue the knee immobilizer with weightbearing as tolerated.  We see him back in 2 weeks I would like an AP and lateral left elbow out of the splint and AP and lateral of his left knee and the knee immobilizer.

## 2016-03-11 NOTE — Telephone Encounter (Signed)
Really can not consider a knee replacement at all until the patella fracture has healed.  Can not do a knee replacement with a broken knee cap.

## 2016-03-11 NOTE — Telephone Encounter (Signed)
Please advise 

## 2016-03-11 NOTE — Telephone Encounter (Signed)
Ok ask the son what he wants and we can order them

## 2016-03-12 DIAGNOSIS — N183 Chronic kidney disease, stage 3 (moderate): Secondary | ICD-10-CM | POA: Diagnosis not present

## 2016-03-12 DIAGNOSIS — M170 Bilateral primary osteoarthritis of knee: Secondary | ICD-10-CM | POA: Diagnosis not present

## 2016-03-12 DIAGNOSIS — I129 Hypertensive chronic kidney disease with stage 1 through stage 4 chronic kidney disease, or unspecified chronic kidney disease: Secondary | ICD-10-CM | POA: Diagnosis not present

## 2016-03-12 DIAGNOSIS — S42412D Displaced simple supracondylar fracture without intercondylar fracture of left humerus, subsequent encounter for fracture with routine healing: Secondary | ICD-10-CM | POA: Diagnosis not present

## 2016-03-12 DIAGNOSIS — I2510 Atherosclerotic heart disease of native coronary artery without angina pectoris: Secondary | ICD-10-CM | POA: Diagnosis not present

## 2016-03-12 DIAGNOSIS — S82002D Unspecified fracture of left patella, subsequent encounter for closed fracture with routine healing: Secondary | ICD-10-CM | POA: Diagnosis not present

## 2016-03-12 NOTE — Telephone Encounter (Signed)
We saw patient in the office

## 2016-03-12 NOTE — Telephone Encounter (Signed)
Patient son aware of the below message from [facility]

## 2016-03-16 DIAGNOSIS — I129 Hypertensive chronic kidney disease with stage 1 through stage 4 chronic kidney disease, or unspecified chronic kidney disease: Secondary | ICD-10-CM | POA: Diagnosis not present

## 2016-03-16 DIAGNOSIS — M170 Bilateral primary osteoarthritis of knee: Secondary | ICD-10-CM | POA: Diagnosis not present

## 2016-03-16 DIAGNOSIS — S42412D Displaced simple supracondylar fracture without intercondylar fracture of left humerus, subsequent encounter for fracture with routine healing: Secondary | ICD-10-CM | POA: Diagnosis not present

## 2016-03-16 DIAGNOSIS — I2510 Atherosclerotic heart disease of native coronary artery without angina pectoris: Secondary | ICD-10-CM | POA: Diagnosis not present

## 2016-03-16 DIAGNOSIS — N183 Chronic kidney disease, stage 3 (moderate): Secondary | ICD-10-CM | POA: Diagnosis not present

## 2016-03-16 DIAGNOSIS — S82002D Unspecified fracture of left patella, subsequent encounter for closed fracture with routine healing: Secondary | ICD-10-CM | POA: Diagnosis not present

## 2016-03-17 DIAGNOSIS — S82002D Unspecified fracture of left patella, subsequent encounter for closed fracture with routine healing: Secondary | ICD-10-CM | POA: Diagnosis not present

## 2016-03-17 DIAGNOSIS — I2510 Atherosclerotic heart disease of native coronary artery without angina pectoris: Secondary | ICD-10-CM | POA: Diagnosis not present

## 2016-03-17 DIAGNOSIS — M170 Bilateral primary osteoarthritis of knee: Secondary | ICD-10-CM | POA: Diagnosis not present

## 2016-03-17 DIAGNOSIS — N183 Chronic kidney disease, stage 3 (moderate): Secondary | ICD-10-CM | POA: Diagnosis not present

## 2016-03-17 DIAGNOSIS — I129 Hypertensive chronic kidney disease with stage 1 through stage 4 chronic kidney disease, or unspecified chronic kidney disease: Secondary | ICD-10-CM | POA: Diagnosis not present

## 2016-03-17 DIAGNOSIS — S42412D Displaced simple supracondylar fracture without intercondylar fracture of left humerus, subsequent encounter for fracture with routine healing: Secondary | ICD-10-CM | POA: Diagnosis not present

## 2016-03-18 DIAGNOSIS — S42412D Displaced simple supracondylar fracture without intercondylar fracture of left humerus, subsequent encounter for fracture with routine healing: Secondary | ICD-10-CM | POA: Diagnosis not present

## 2016-03-18 DIAGNOSIS — I2510 Atherosclerotic heart disease of native coronary artery without angina pectoris: Secondary | ICD-10-CM | POA: Diagnosis not present

## 2016-03-18 DIAGNOSIS — S82002D Unspecified fracture of left patella, subsequent encounter for closed fracture with routine healing: Secondary | ICD-10-CM | POA: Diagnosis not present

## 2016-03-18 DIAGNOSIS — M170 Bilateral primary osteoarthritis of knee: Secondary | ICD-10-CM | POA: Diagnosis not present

## 2016-03-18 DIAGNOSIS — N183 Chronic kidney disease, stage 3 (moderate): Secondary | ICD-10-CM | POA: Diagnosis not present

## 2016-03-18 DIAGNOSIS — I129 Hypertensive chronic kidney disease with stage 1 through stage 4 chronic kidney disease, or unspecified chronic kidney disease: Secondary | ICD-10-CM | POA: Diagnosis not present

## 2016-03-19 DIAGNOSIS — I129 Hypertensive chronic kidney disease with stage 1 through stage 4 chronic kidney disease, or unspecified chronic kidney disease: Secondary | ICD-10-CM | POA: Diagnosis not present

## 2016-03-19 DIAGNOSIS — I2510 Atherosclerotic heart disease of native coronary artery without angina pectoris: Secondary | ICD-10-CM | POA: Diagnosis not present

## 2016-03-19 DIAGNOSIS — S42412D Displaced simple supracondylar fracture without intercondylar fracture of left humerus, subsequent encounter for fracture with routine healing: Secondary | ICD-10-CM | POA: Diagnosis not present

## 2016-03-19 DIAGNOSIS — S82002D Unspecified fracture of left patella, subsequent encounter for closed fracture with routine healing: Secondary | ICD-10-CM | POA: Diagnosis not present

## 2016-03-19 DIAGNOSIS — N183 Chronic kidney disease, stage 3 (moderate): Secondary | ICD-10-CM | POA: Diagnosis not present

## 2016-03-19 DIAGNOSIS — M170 Bilateral primary osteoarthritis of knee: Secondary | ICD-10-CM | POA: Diagnosis not present

## 2016-03-22 DIAGNOSIS — N183 Chronic kidney disease, stage 3 (moderate): Secondary | ICD-10-CM | POA: Diagnosis not present

## 2016-03-22 DIAGNOSIS — I129 Hypertensive chronic kidney disease with stage 1 through stage 4 chronic kidney disease, or unspecified chronic kidney disease: Secondary | ICD-10-CM | POA: Diagnosis not present

## 2016-03-22 DIAGNOSIS — I2510 Atherosclerotic heart disease of native coronary artery without angina pectoris: Secondary | ICD-10-CM | POA: Diagnosis not present

## 2016-03-22 DIAGNOSIS — S82002D Unspecified fracture of left patella, subsequent encounter for closed fracture with routine healing: Secondary | ICD-10-CM | POA: Diagnosis not present

## 2016-03-22 DIAGNOSIS — S42412D Displaced simple supracondylar fracture without intercondylar fracture of left humerus, subsequent encounter for fracture with routine healing: Secondary | ICD-10-CM | POA: Diagnosis not present

## 2016-03-22 DIAGNOSIS — M170 Bilateral primary osteoarthritis of knee: Secondary | ICD-10-CM | POA: Diagnosis not present

## 2016-03-23 DIAGNOSIS — M170 Bilateral primary osteoarthritis of knee: Secondary | ICD-10-CM | POA: Diagnosis not present

## 2016-03-23 DIAGNOSIS — I129 Hypertensive chronic kidney disease with stage 1 through stage 4 chronic kidney disease, or unspecified chronic kidney disease: Secondary | ICD-10-CM | POA: Diagnosis not present

## 2016-03-23 DIAGNOSIS — N183 Chronic kidney disease, stage 3 (moderate): Secondary | ICD-10-CM | POA: Diagnosis not present

## 2016-03-23 DIAGNOSIS — I2510 Atherosclerotic heart disease of native coronary artery without angina pectoris: Secondary | ICD-10-CM | POA: Diagnosis not present

## 2016-03-23 DIAGNOSIS — S42412D Displaced simple supracondylar fracture without intercondylar fracture of left humerus, subsequent encounter for fracture with routine healing: Secondary | ICD-10-CM | POA: Diagnosis not present

## 2016-03-23 DIAGNOSIS — S82002D Unspecified fracture of left patella, subsequent encounter for closed fracture with routine healing: Secondary | ICD-10-CM | POA: Diagnosis not present

## 2016-03-24 DIAGNOSIS — I2510 Atherosclerotic heart disease of native coronary artery without angina pectoris: Secondary | ICD-10-CM | POA: Diagnosis not present

## 2016-03-24 DIAGNOSIS — S82002D Unspecified fracture of left patella, subsequent encounter for closed fracture with routine healing: Secondary | ICD-10-CM | POA: Diagnosis not present

## 2016-03-24 DIAGNOSIS — M170 Bilateral primary osteoarthritis of knee: Secondary | ICD-10-CM | POA: Diagnosis not present

## 2016-03-24 DIAGNOSIS — S42412D Displaced simple supracondylar fracture without intercondylar fracture of left humerus, subsequent encounter for fracture with routine healing: Secondary | ICD-10-CM | POA: Diagnosis not present

## 2016-03-24 DIAGNOSIS — I129 Hypertensive chronic kidney disease with stage 1 through stage 4 chronic kidney disease, or unspecified chronic kidney disease: Secondary | ICD-10-CM | POA: Diagnosis not present

## 2016-03-24 DIAGNOSIS — N183 Chronic kidney disease, stage 3 (moderate): Secondary | ICD-10-CM | POA: Diagnosis not present

## 2016-03-25 ENCOUNTER — Ambulatory Visit (INDEPENDENT_AMBULATORY_CARE_PROVIDER_SITE_OTHER): Payer: Medicare Other

## 2016-03-25 ENCOUNTER — Ambulatory Visit (INDEPENDENT_AMBULATORY_CARE_PROVIDER_SITE_OTHER): Payer: Medicare Other | Admitting: Orthopaedic Surgery

## 2016-03-25 DIAGNOSIS — M25562 Pain in left knee: Secondary | ICD-10-CM

## 2016-03-25 DIAGNOSIS — I2510 Atherosclerotic heart disease of native coronary artery without angina pectoris: Secondary | ICD-10-CM | POA: Diagnosis not present

## 2016-03-25 DIAGNOSIS — S82002D Unspecified fracture of left patella, subsequent encounter for closed fracture with routine healing: Secondary | ICD-10-CM | POA: Diagnosis not present

## 2016-03-25 DIAGNOSIS — M25522 Pain in left elbow: Secondary | ICD-10-CM

## 2016-03-25 DIAGNOSIS — N183 Chronic kidney disease, stage 3 (moderate): Secondary | ICD-10-CM | POA: Diagnosis not present

## 2016-03-25 DIAGNOSIS — S42412D Displaced simple supracondylar fracture without intercondylar fracture of left humerus, subsequent encounter for fracture with routine healing: Secondary | ICD-10-CM | POA: Diagnosis not present

## 2016-03-25 DIAGNOSIS — M170 Bilateral primary osteoarthritis of knee: Secondary | ICD-10-CM | POA: Diagnosis not present

## 2016-03-25 DIAGNOSIS — I129 Hypertensive chronic kidney disease with stage 1 through stage 4 chronic kidney disease, or unspecified chronic kidney disease: Secondary | ICD-10-CM | POA: Diagnosis not present

## 2016-03-25 NOTE — Progress Notes (Signed)
Office Visit Note   Patient: Michael Blanchard           Date of Birth: 1925-08-02           MRN: [id number] Visit Date: 03/25/2016              Requested by: Seward Carol, MD [address] [place], [place] [address] PCP: [name], MD   Assessment & Plan: Visit Diagnoses:  1. Acute pain of left knee   2. Pain in left elbow     Plan: At this point we will place a splint on his left elbow the can be removed for hygiene purposes. I will let him weight-bear through his forearm on the left side up with a platform walker. I want to continue the knee immobilizer on his left knee for 3 more weeks. We'll see him back in 3 weeks with an AP and lateral views left elbow an AP and lateral of his left knee. In 3 weeks will probably stop his knee immobilizer as well.  Follow-Up Instructions: Return in about 3 weeks (around 04/15/2016).   Orders:  Orders Placed This Encounter  Procedures  . XR Elbow 2 Views Left  . XR Knee 1-2 Views Left   No orders of the defined types were placed in this encounter.     Procedures: No procedures performed   Clinical Data: No additional findings.   Subjective: No chief complaint on file. He is about 3 weeks out from mechanical fall when he sustained a nondisplaced left patella fracture and a left distal humerus fracture. He's been in a knee immobilizer on the left knee and a splint on the left elbow  HPI  Review of Systems   Objective: Vital Signs: There were no vitals taken for this visit.  Physical Exam He is alert and oriented 3 Ortho Exam His admission of his left elbow shows swelling around the elbow but his elbow is stable. His get stiff range of motion of the elbow complete abdomen a cast. Examination of the left knee shows no pain over the patella. Specialty Comments:  No specialty comments available.  Imaging: Xr Elbow 2 Views Left  Result Date: 03/25/2016 2 views of his left elbow AP and lateral show severe  arthritic changes in the elbow joint. He has a healing extra articular supracondylar fracture.  Xr Knee 1-2 Views Left  Result Date: 03/25/2016 An AP and lateral of his left knee shows the patella fracture is in excellent alignment with no displacement at all. He has severe tricompartmental arthritis of his left knee    PMFS History: Patient Active Problem List   Diagnosis Date Noted  . Chronic anticoagulation for mechanical heart valve 02/19/2015  . Cancer of ascending colon ([place]) 02/19/2015  . Cancer of cecum    . Hypochromic microcytic anemia   . CKD (chronic kidney disease) stage 3, GFR 30-59 ml/min 01/08/2015  . Essential hypertension 01/08/2015  . Symptomatic anemia 01/07/2015  . Anemia 01/07/2015  . Trigeminal neuralgia 03/21/2013  . Malaise and fatigue 01/25/2011  . Benign hypertensive heart disease without heart failure 09/24/2010  . Hx of CABG 09/24/2010  . S/P aortic valve replacement with bioprosthetic valve 09/24/2010  . Hypercholesterolemia 09/24/2010  . Osteoarthritis 09/24/2010   Past Medical History:  Diagnosis Date  . Arthritis    bil knees, right shoulder  . AVD (aortic valve disease)   . Chronic kidney disease    stage 3 kidney disease  . Complication  of anesthesia    family states pt memory was bad 3-4 weeks after surgery  . Coronary atherosclerosis   . Fatigue   . Hyperlipidemia   . Hypertension   . Stroke [facility])    "I had a very milk sstroke one time the doctor said", current mini strokes  . Trigeminal neuralgia     Family History  Problem Relation Age of Onset  . Stroke Mother   . Aneurysm Mother   . Aneurysm Father   . Heart disease Brother     Past Surgical History:  Procedure Laterality Date  . AORTIC VALVE REPLACEMENT     Carpentier-Edwards Pericardial Valve, MR Safe 1.5T  . CARDIAC CATHETERIZATION  06/22/1999   SEVERE HYPOKINESIA OF THE MID TO DISTAL INFERIOR WALL AND APEX.MODERATE LEFT VENTRICULAR DYSFUNCTION WITH EF 45%  .  COLONOSCOPY N/A 01/12/2015   Procedure: COLONOSCOPY;  Surgeon: Wilford Corner, MD;  Location: [facility] ENDOSCOPY;  Service: Endoscopy;  Laterality: N/A;  . CORONARY ARTERY BYPASS GRAFT  2001  . ESOPHAGOGASTRODUODENOSCOPY N/A 01/10/2015   Procedure: ESOPHAGOGASTRODUODENOSCOPY (EGD);  Surgeon: Clarene Essex, MD;  Location[facility];  Service: Endoscopy;  Laterality: N/A;   Social History   Occupational History  .  Retired    retired   Social History Main Topics  . Smoking status: Former Smoker    Quit date: 09/13/1985  . Smokeless tobacco: Never Used  . Alcohol use No  . Drug use: No  . Sexual activity: Not on file

## 2016-03-26 DIAGNOSIS — S82002D Unspecified fracture of left patella, subsequent encounter for closed fracture with routine healing: Secondary | ICD-10-CM | POA: Diagnosis not present

## 2016-03-26 DIAGNOSIS — I2510 Atherosclerotic heart disease of native coronary artery without angina pectoris: Secondary | ICD-10-CM | POA: Diagnosis not present

## 2016-03-26 DIAGNOSIS — S42412D Displaced simple supracondylar fracture without intercondylar fracture of left humerus, subsequent encounter for fracture with routine healing: Secondary | ICD-10-CM | POA: Diagnosis not present

## 2016-03-26 DIAGNOSIS — I129 Hypertensive chronic kidney disease with stage 1 through stage 4 chronic kidney disease, or unspecified chronic kidney disease: Secondary | ICD-10-CM | POA: Diagnosis not present

## 2016-03-26 DIAGNOSIS — N183 Chronic kidney disease, stage 3 (moderate): Secondary | ICD-10-CM | POA: Diagnosis not present

## 2016-03-26 DIAGNOSIS — M170 Bilateral primary osteoarthritis of knee: Secondary | ICD-10-CM | POA: Diagnosis not present

## 2016-03-29 DIAGNOSIS — M170 Bilateral primary osteoarthritis of knee: Secondary | ICD-10-CM | POA: Diagnosis not present

## 2016-03-29 DIAGNOSIS — I2510 Atherosclerotic heart disease of native coronary artery without angina pectoris: Secondary | ICD-10-CM | POA: Diagnosis not present

## 2016-03-29 DIAGNOSIS — S42412D Displaced simple supracondylar fracture without intercondylar fracture of left humerus, subsequent encounter for fracture with routine healing: Secondary | ICD-10-CM | POA: Diagnosis not present

## 2016-03-29 DIAGNOSIS — I129 Hypertensive chronic kidney disease with stage 1 through stage 4 chronic kidney disease, or unspecified chronic kidney disease: Secondary | ICD-10-CM | POA: Diagnosis not present

## 2016-03-29 DIAGNOSIS — N183 Chronic kidney disease, stage 3 (moderate): Secondary | ICD-10-CM | POA: Diagnosis not present

## 2016-03-29 DIAGNOSIS — S82002D Unspecified fracture of left patella, subsequent encounter for closed fracture with routine healing: Secondary | ICD-10-CM | POA: Diagnosis not present

## 2016-03-31 DIAGNOSIS — S42412D Displaced simple supracondylar fracture without intercondylar fracture of left humerus, subsequent encounter for fracture with routine healing: Secondary | ICD-10-CM | POA: Diagnosis not present

## 2016-03-31 DIAGNOSIS — M170 Bilateral primary osteoarthritis of knee: Secondary | ICD-10-CM | POA: Diagnosis not present

## 2016-03-31 DIAGNOSIS — S82002D Unspecified fracture of left patella, subsequent encounter for closed fracture with routine healing: Secondary | ICD-10-CM | POA: Diagnosis not present

## 2016-03-31 DIAGNOSIS — N183 Chronic kidney disease, stage 3 (moderate): Secondary | ICD-10-CM | POA: Diagnosis not present

## 2016-03-31 DIAGNOSIS — I2510 Atherosclerotic heart disease of native coronary artery without angina pectoris: Secondary | ICD-10-CM | POA: Diagnosis not present

## 2016-03-31 DIAGNOSIS — I129 Hypertensive chronic kidney disease with stage 1 through stage 4 chronic kidney disease, or unspecified chronic kidney disease: Secondary | ICD-10-CM | POA: Diagnosis not present

## 2016-04-02 ENCOUNTER — Telehealth (INDEPENDENT_AMBULATORY_CARE_PROVIDER_SITE_OTHER): Payer: Self-pay | Admitting: Orthopaedic Surgery

## 2016-04-02 DIAGNOSIS — M170 Bilateral primary osteoarthritis of knee: Secondary | ICD-10-CM | POA: Diagnosis not present

## 2016-04-02 DIAGNOSIS — N183 Chronic kidney disease, stage 3 (moderate): Secondary | ICD-10-CM | POA: Diagnosis not present

## 2016-04-02 DIAGNOSIS — S82002D Unspecified fracture of left patella, subsequent encounter for closed fracture with routine healing: Secondary | ICD-10-CM | POA: Diagnosis not present

## 2016-04-02 DIAGNOSIS — I129 Hypertensive chronic kidney disease with stage 1 through stage 4 chronic kidney disease, or unspecified chronic kidney disease: Secondary | ICD-10-CM | POA: Diagnosis not present

## 2016-04-02 DIAGNOSIS — S42412D Displaced simple supracondylar fracture without intercondylar fracture of left humerus, subsequent encounter for fracture with routine healing: Secondary | ICD-10-CM | POA: Diagnosis not present

## 2016-04-02 DIAGNOSIS — I2510 Atherosclerotic heart disease of native coronary artery without angina pectoris: Secondary | ICD-10-CM | POA: Diagnosis not present

## 2016-04-02 NOTE — Telephone Encounter (Signed)
[facility]  Cb#: [phone]  Requesting to extend  Treatment to 2x a wk for 4 wks.   She also has a nasty case of  quadrasep tendonitis, that they are treating.

## 2016-04-02 NOTE — Telephone Encounter (Signed)
Verbal order given  

## 2016-04-06 DIAGNOSIS — S82002D Unspecified fracture of left patella, subsequent encounter for closed fracture with routine healing: Secondary | ICD-10-CM | POA: Diagnosis not present

## 2016-04-06 DIAGNOSIS — S42412D Displaced simple supracondylar fracture without intercondylar fracture of left humerus, subsequent encounter for fracture with routine healing: Secondary | ICD-10-CM | POA: Diagnosis not present

## 2016-04-06 DIAGNOSIS — M170 Bilateral primary osteoarthritis of knee: Secondary | ICD-10-CM | POA: Diagnosis not present

## 2016-04-06 DIAGNOSIS — N183 Chronic kidney disease, stage 3 (moderate): Secondary | ICD-10-CM | POA: Diagnosis not present

## 2016-04-06 DIAGNOSIS — I2510 Atherosclerotic heart disease of native coronary artery without angina pectoris: Secondary | ICD-10-CM | POA: Diagnosis not present

## 2016-04-06 DIAGNOSIS — I129 Hypertensive chronic kidney disease with stage 1 through stage 4 chronic kidney disease, or unspecified chronic kidney disease: Secondary | ICD-10-CM | POA: Diagnosis not present

## 2016-04-09 DIAGNOSIS — I129 Hypertensive chronic kidney disease with stage 1 through stage 4 chronic kidney disease, or unspecified chronic kidney disease: Secondary | ICD-10-CM | POA: Diagnosis not present

## 2016-04-09 DIAGNOSIS — N183 Chronic kidney disease, stage 3 (moderate): Secondary | ICD-10-CM | POA: Diagnosis not present

## 2016-04-09 DIAGNOSIS — I2510 Atherosclerotic heart disease of native coronary artery without angina pectoris: Secondary | ICD-10-CM | POA: Diagnosis not present

## 2016-04-09 DIAGNOSIS — S82002D Unspecified fracture of left patella, subsequent encounter for closed fracture with routine healing: Secondary | ICD-10-CM | POA: Diagnosis not present

## 2016-04-09 DIAGNOSIS — M170 Bilateral primary osteoarthritis of knee: Secondary | ICD-10-CM | POA: Diagnosis not present

## 2016-04-09 DIAGNOSIS — S42412D Displaced simple supracondylar fracture without intercondylar fracture of left humerus, subsequent encounter for fracture with routine healing: Secondary | ICD-10-CM | POA: Diagnosis not present

## 2016-04-12 ENCOUNTER — Ambulatory Visit: Payer: Medicare Other | Admitting: Cardiology

## 2016-04-12 DIAGNOSIS — I2510 Atherosclerotic heart disease of native coronary artery without angina pectoris: Secondary | ICD-10-CM | POA: Diagnosis not present

## 2016-04-12 DIAGNOSIS — S42412D Displaced simple supracondylar fracture without intercondylar fracture of left humerus, subsequent encounter for fracture with routine healing: Secondary | ICD-10-CM | POA: Diagnosis not present

## 2016-04-12 DIAGNOSIS — M170 Bilateral primary osteoarthritis of knee: Secondary | ICD-10-CM | POA: Diagnosis not present

## 2016-04-12 DIAGNOSIS — N183 Chronic kidney disease, stage 3 (moderate): Secondary | ICD-10-CM | POA: Diagnosis not present

## 2016-04-12 DIAGNOSIS — I129 Hypertensive chronic kidney disease with stage 1 through stage 4 chronic kidney disease, or unspecified chronic kidney disease: Secondary | ICD-10-CM | POA: Diagnosis not present

## 2016-04-12 DIAGNOSIS — S82002D Unspecified fracture of left patella, subsequent encounter for closed fracture with routine healing: Secondary | ICD-10-CM | POA: Diagnosis not present

## 2016-04-13 DIAGNOSIS — S82002D Unspecified fracture of left patella, subsequent encounter for closed fracture with routine healing: Secondary | ICD-10-CM | POA: Diagnosis not present

## 2016-04-13 DIAGNOSIS — S42412D Displaced simple supracondylar fracture without intercondylar fracture of left humerus, subsequent encounter for fracture with routine healing: Secondary | ICD-10-CM | POA: Diagnosis not present

## 2016-04-13 DIAGNOSIS — M170 Bilateral primary osteoarthritis of knee: Secondary | ICD-10-CM | POA: Diagnosis not present

## 2016-04-13 DIAGNOSIS — I129 Hypertensive chronic kidney disease with stage 1 through stage 4 chronic kidney disease, or unspecified chronic kidney disease: Secondary | ICD-10-CM | POA: Diagnosis not present

## 2016-04-13 DIAGNOSIS — I2510 Atherosclerotic heart disease of native coronary artery without angina pectoris: Secondary | ICD-10-CM | POA: Diagnosis not present

## 2016-04-13 DIAGNOSIS — N183 Chronic kidney disease, stage 3 (moderate): Secondary | ICD-10-CM | POA: Diagnosis not present

## 2016-04-16 DIAGNOSIS — M170 Bilateral primary osteoarthritis of knee: Secondary | ICD-10-CM | POA: Diagnosis not present

## 2016-04-16 DIAGNOSIS — S42412D Displaced simple supracondylar fracture without intercondylar fracture of left humerus, subsequent encounter for fracture with routine healing: Secondary | ICD-10-CM | POA: Diagnosis not present

## 2016-04-16 DIAGNOSIS — N183 Chronic kidney disease, stage 3 (moderate): Secondary | ICD-10-CM | POA: Diagnosis not present

## 2016-04-16 DIAGNOSIS — I129 Hypertensive chronic kidney disease with stage 1 through stage 4 chronic kidney disease, or unspecified chronic kidney disease: Secondary | ICD-10-CM | POA: Diagnosis not present

## 2016-04-16 DIAGNOSIS — S82002D Unspecified fracture of left patella, subsequent encounter for closed fracture with routine healing: Secondary | ICD-10-CM | POA: Diagnosis not present

## 2016-04-16 DIAGNOSIS — I2510 Atherosclerotic heart disease of native coronary artery without angina pectoris: Secondary | ICD-10-CM | POA: Diagnosis not present

## 2016-04-19 ENCOUNTER — Ambulatory Visit (INDEPENDENT_AMBULATORY_CARE_PROVIDER_SITE_OTHER): Payer: Medicare Other | Admitting: Orthopaedic Surgery

## 2016-04-19 ENCOUNTER — Ambulatory Visit (INDEPENDENT_AMBULATORY_CARE_PROVIDER_SITE_OTHER): Payer: Medicare Other

## 2016-04-19 ENCOUNTER — Encounter (INDEPENDENT_AMBULATORY_CARE_PROVIDER_SITE_OTHER): Payer: Self-pay | Admitting: Orthopaedic Surgery

## 2016-04-19 DIAGNOSIS — I25810 Atherosclerosis of coronary artery bypass graft(s) without angina pectoris: Secondary | ICD-10-CM | POA: Diagnosis not present

## 2016-04-19 DIAGNOSIS — M150 Primary generalized (osteo)arthritis: Secondary | ICD-10-CM

## 2016-04-19 DIAGNOSIS — G8929 Other chronic pain: Secondary | ICD-10-CM | POA: Diagnosis not present

## 2016-04-19 DIAGNOSIS — R413 Other amnesia: Secondary | ICD-10-CM | POA: Diagnosis not present

## 2016-04-19 DIAGNOSIS — I10 Essential (primary) hypertension: Secondary | ICD-10-CM | POA: Diagnosis not present

## 2016-04-19 DIAGNOSIS — N183 Chronic kidney disease, stage 3 (moderate): Secondary | ICD-10-CM | POA: Diagnosis not present

## 2016-04-19 DIAGNOSIS — M159 Polyosteoarthritis, unspecified: Secondary | ICD-10-CM

## 2016-04-19 NOTE — Progress Notes (Signed)
Mr. Kolesnik is following up from a nondisplaced left patella fracture, nondisplaced left distal humerus fracture is mainly to wheelchair but it does angulate. He's been in a knee immobilizer on the left knee in a splint on the left elbow. He is over 8 weeks out from his injuries.  On examination of his left elbow there is some grinding at the shoulder but not elbow itself. He actually has excellent range of motion of that elbow and he is pain-free. AP and lateral left elbow were obtained and show the transcondylar distal humerus fracture of the left side is healed. Both knees have severe patellofemoral crepitation with good range of motion but are painful globally. Both knees have varus deformity. Both knees have intact extensor mechanisms. X-rays the left knee shows severe tricompartmental arthritis affects of both knees. The patella fracture itself is healed.  At this point he'll continue home therapy with no restrictions of his left upper extremity or bilateral lower extremity's. They need to work on quad training exercises balance and coordination. He still wants to consider knee replacement surgery in the future. We'll see him back in a month's he is doing overall but no x-rays are needed.

## 2016-04-21 DIAGNOSIS — N183 Chronic kidney disease, stage 3 (moderate): Secondary | ICD-10-CM | POA: Diagnosis not present

## 2016-04-21 DIAGNOSIS — S82002D Unspecified fracture of left patella, subsequent encounter for closed fracture with routine healing: Secondary | ICD-10-CM | POA: Diagnosis not present

## 2016-04-21 DIAGNOSIS — M170 Bilateral primary osteoarthritis of knee: Secondary | ICD-10-CM | POA: Diagnosis not present

## 2016-04-21 DIAGNOSIS — S42412D Displaced simple supracondylar fracture without intercondylar fracture of left humerus, subsequent encounter for fracture with routine healing: Secondary | ICD-10-CM | POA: Diagnosis not present

## 2016-04-21 DIAGNOSIS — I2510 Atherosclerotic heart disease of native coronary artery without angina pectoris: Secondary | ICD-10-CM | POA: Diagnosis not present

## 2016-04-21 DIAGNOSIS — I129 Hypertensive chronic kidney disease with stage 1 through stage 4 chronic kidney disease, or unspecified chronic kidney disease: Secondary | ICD-10-CM | POA: Diagnosis not present

## 2016-04-22 DIAGNOSIS — S42412D Displaced simple supracondylar fracture without intercondylar fracture of left humerus, subsequent encounter for fracture with routine healing: Secondary | ICD-10-CM | POA: Diagnosis not present

## 2016-04-22 DIAGNOSIS — N183 Chronic kidney disease, stage 3 (moderate): Secondary | ICD-10-CM | POA: Diagnosis not present

## 2016-04-22 DIAGNOSIS — M170 Bilateral primary osteoarthritis of knee: Secondary | ICD-10-CM | POA: Diagnosis not present

## 2016-04-22 DIAGNOSIS — S82002D Unspecified fracture of left patella, subsequent encounter for closed fracture with routine healing: Secondary | ICD-10-CM | POA: Diagnosis not present

## 2016-04-22 DIAGNOSIS — I129 Hypertensive chronic kidney disease with stage 1 through stage 4 chronic kidney disease, or unspecified chronic kidney disease: Secondary | ICD-10-CM | POA: Diagnosis not present

## 2016-04-22 DIAGNOSIS — I2510 Atherosclerotic heart disease of native coronary artery without angina pectoris: Secondary | ICD-10-CM | POA: Diagnosis not present

## 2016-04-23 DIAGNOSIS — I129 Hypertensive chronic kidney disease with stage 1 through stage 4 chronic kidney disease, or unspecified chronic kidney disease: Secondary | ICD-10-CM | POA: Diagnosis not present

## 2016-04-23 DIAGNOSIS — M170 Bilateral primary osteoarthritis of knee: Secondary | ICD-10-CM | POA: Diagnosis not present

## 2016-04-23 DIAGNOSIS — N183 Chronic kidney disease, stage 3 (moderate): Secondary | ICD-10-CM | POA: Diagnosis not present

## 2016-04-23 DIAGNOSIS — I2510 Atherosclerotic heart disease of native coronary artery without angina pectoris: Secondary | ICD-10-CM | POA: Diagnosis not present

## 2016-04-23 DIAGNOSIS — S42412D Displaced simple supracondylar fracture without intercondylar fracture of left humerus, subsequent encounter for fracture with routine healing: Secondary | ICD-10-CM | POA: Diagnosis not present

## 2016-04-23 DIAGNOSIS — S82002D Unspecified fracture of left patella, subsequent encounter for closed fracture with routine healing: Secondary | ICD-10-CM | POA: Diagnosis not present

## 2016-04-26 ENCOUNTER — Other Ambulatory Visit: Payer: Self-pay | Admitting: *Deleted

## 2016-04-26 DIAGNOSIS — I2510 Atherosclerotic heart disease of native coronary artery without angina pectoris: Secondary | ICD-10-CM | POA: Diagnosis not present

## 2016-04-26 DIAGNOSIS — S82002D Unspecified fracture of left patella, subsequent encounter for closed fracture with routine healing: Secondary | ICD-10-CM | POA: Diagnosis not present

## 2016-04-26 DIAGNOSIS — M170 Bilateral primary osteoarthritis of knee: Secondary | ICD-10-CM | POA: Diagnosis not present

## 2016-04-26 DIAGNOSIS — N183 Chronic kidney disease, stage 3 (moderate): Secondary | ICD-10-CM | POA: Diagnosis not present

## 2016-04-26 DIAGNOSIS — I129 Hypertensive chronic kidney disease with stage 1 through stage 4 chronic kidney disease, or unspecified chronic kidney disease: Secondary | ICD-10-CM | POA: Diagnosis not present

## 2016-04-26 DIAGNOSIS — S42412D Displaced simple supracondylar fracture without intercondylar fracture of left humerus, subsequent encounter for fracture with routine healing: Secondary | ICD-10-CM | POA: Diagnosis not present

## 2016-04-26 MED ORDER — DONEPEZIL HCL 10 MG PO TABS: 10.0000 mg | ORAL_TABLET | Freq: Every day | ORAL | 4 refills | Status: DC

## 2016-04-27 DIAGNOSIS — S42412D Displaced simple supracondylar fracture without intercondylar fracture of left humerus, subsequent encounter for fracture with routine healing: Secondary | ICD-10-CM | POA: Diagnosis not present

## 2016-04-27 DIAGNOSIS — M170 Bilateral primary osteoarthritis of knee: Secondary | ICD-10-CM | POA: Diagnosis not present

## 2016-04-27 DIAGNOSIS — N183 Chronic kidney disease, stage 3 (moderate): Secondary | ICD-10-CM | POA: Diagnosis not present

## 2016-04-27 DIAGNOSIS — S82002D Unspecified fracture of left patella, subsequent encounter for closed fracture with routine healing: Secondary | ICD-10-CM | POA: Diagnosis not present

## 2016-04-27 DIAGNOSIS — I2510 Atherosclerotic heart disease of native coronary artery without angina pectoris: Secondary | ICD-10-CM | POA: Diagnosis not present

## 2016-04-27 DIAGNOSIS — I129 Hypertensive chronic kidney disease with stage 1 through stage 4 chronic kidney disease, or unspecified chronic kidney disease: Secondary | ICD-10-CM | POA: Diagnosis not present

## 2016-04-28 DIAGNOSIS — S42412D Displaced simple supracondylar fracture without intercondylar fracture of left humerus, subsequent encounter for fracture with routine healing: Secondary | ICD-10-CM | POA: Diagnosis not present

## 2016-04-28 DIAGNOSIS — I129 Hypertensive chronic kidney disease with stage 1 through stage 4 chronic kidney disease, or unspecified chronic kidney disease: Secondary | ICD-10-CM | POA: Diagnosis not present

## 2016-04-28 DIAGNOSIS — S82002D Unspecified fracture of left patella, subsequent encounter for closed fracture with routine healing: Secondary | ICD-10-CM | POA: Diagnosis not present

## 2016-04-28 DIAGNOSIS — N183 Chronic kidney disease, stage 3 (moderate): Secondary | ICD-10-CM | POA: Diagnosis not present

## 2016-04-28 DIAGNOSIS — M170 Bilateral primary osteoarthritis of knee: Secondary | ICD-10-CM | POA: Diagnosis not present

## 2016-04-28 DIAGNOSIS — I2510 Atherosclerotic heart disease of native coronary artery without angina pectoris: Secondary | ICD-10-CM | POA: Diagnosis not present

## 2016-04-29 ENCOUNTER — Telehealth (INDEPENDENT_AMBULATORY_CARE_PROVIDER_SITE_OTHER): Payer: Self-pay | Admitting: *Deleted

## 2016-04-29 DIAGNOSIS — S42412D Displaced simple supracondylar fracture without intercondylar fracture of left humerus, subsequent encounter for fracture with routine healing: Secondary | ICD-10-CM | POA: Diagnosis not present

## 2016-04-29 DIAGNOSIS — I129 Hypertensive chronic kidney disease with stage 1 through stage 4 chronic kidney disease, or unspecified chronic kidney disease: Secondary | ICD-10-CM | POA: Diagnosis not present

## 2016-04-29 DIAGNOSIS — I2510 Atherosclerotic heart disease of native coronary artery without angina pectoris: Secondary | ICD-10-CM | POA: Diagnosis not present

## 2016-04-29 DIAGNOSIS — S82002D Unspecified fracture of left patella, subsequent encounter for closed fracture with routine healing: Secondary | ICD-10-CM | POA: Diagnosis not present

## 2016-04-29 DIAGNOSIS — N183 Chronic kidney disease, stage 3 (moderate): Secondary | ICD-10-CM | POA: Diagnosis not present

## 2016-04-29 DIAGNOSIS — M170 Bilateral primary osteoarthritis of knee: Secondary | ICD-10-CM | POA: Diagnosis not present

## 2016-04-29 NOTE — Telephone Encounter (Signed)
Michael Blanchard from [facility] called this morning in regards to needing verbal orders for physical therapy please. Her CB # [phone]. Thank you

## 2016-04-29 NOTE — Telephone Encounter (Signed)
Verbal order left on VM for [facility]

## 2016-04-29 NOTE — Telephone Encounter (Signed)
The duration for PT is twice a week for 4 weeks please. Thank you

## 2016-05-04 DIAGNOSIS — S82002D Unspecified fracture of left patella, subsequent encounter for closed fracture with routine healing: Secondary | ICD-10-CM | POA: Diagnosis not present

## 2016-05-04 DIAGNOSIS — I2510 Atherosclerotic heart disease of native coronary artery without angina pectoris: Secondary | ICD-10-CM | POA: Diagnosis not present

## 2016-05-04 DIAGNOSIS — S42412D Displaced simple supracondylar fracture without intercondylar fracture of left humerus, subsequent encounter for fracture with routine healing: Secondary | ICD-10-CM | POA: Diagnosis not present

## 2016-05-04 DIAGNOSIS — N183 Chronic kidney disease, stage 3 (moderate): Secondary | ICD-10-CM | POA: Diagnosis not present

## 2016-05-04 DIAGNOSIS — M170 Bilateral primary osteoarthritis of knee: Secondary | ICD-10-CM | POA: Diagnosis not present

## 2016-05-04 DIAGNOSIS — I129 Hypertensive chronic kidney disease with stage 1 through stage 4 chronic kidney disease, or unspecified chronic kidney disease: Secondary | ICD-10-CM | POA: Diagnosis not present

## 2016-05-06 DIAGNOSIS — M170 Bilateral primary osteoarthritis of knee: Secondary | ICD-10-CM | POA: Diagnosis not present

## 2016-05-06 DIAGNOSIS — D649 Anemia, unspecified: Secondary | ICD-10-CM | POA: Diagnosis not present

## 2016-05-06 DIAGNOSIS — N183 Chronic kidney disease, stage 3 (moderate): Secondary | ICD-10-CM | POA: Diagnosis not present

## 2016-05-06 DIAGNOSIS — Z8673 Personal history of transient ischemic attack (TIA), and cerebral infarction without residual deficits: Secondary | ICD-10-CM | POA: Diagnosis not present

## 2016-05-06 DIAGNOSIS — I2510 Atherosclerotic heart disease of native coronary artery without angina pectoris: Secondary | ICD-10-CM | POA: Diagnosis not present

## 2016-05-06 DIAGNOSIS — S42412D Displaced simple supracondylar fracture without intercondylar fracture of left humerus, subsequent encounter for fracture with routine healing: Secondary | ICD-10-CM | POA: Diagnosis not present

## 2016-05-06 DIAGNOSIS — Z952 Presence of prosthetic heart valve: Secondary | ICD-10-CM | POA: Diagnosis not present

## 2016-05-06 DIAGNOSIS — Z9181 History of falling: Secondary | ICD-10-CM | POA: Diagnosis not present

## 2016-05-06 DIAGNOSIS — S82002D Unspecified fracture of left patella, subsequent encounter for closed fracture with routine healing: Secondary | ICD-10-CM | POA: Diagnosis not present

## 2016-05-06 DIAGNOSIS — I129 Hypertensive chronic kidney disease with stage 1 through stage 4 chronic kidney disease, or unspecified chronic kidney disease: Secondary | ICD-10-CM | POA: Diagnosis not present

## 2016-05-06 DIAGNOSIS — E785 Hyperlipidemia, unspecified: Secondary | ICD-10-CM | POA: Diagnosis not present

## 2016-05-06 DIAGNOSIS — Z85038 Personal history of other malignant neoplasm of large intestine: Secondary | ICD-10-CM | POA: Diagnosis not present

## 2016-05-12 DIAGNOSIS — S42412D Displaced simple supracondylar fracture without intercondylar fracture of left humerus, subsequent encounter for fracture with routine healing: Secondary | ICD-10-CM | POA: Diagnosis not present

## 2016-05-12 DIAGNOSIS — S82002D Unspecified fracture of left patella, subsequent encounter for closed fracture with routine healing: Secondary | ICD-10-CM | POA: Diagnosis not present

## 2016-05-12 DIAGNOSIS — I129 Hypertensive chronic kidney disease with stage 1 through stage 4 chronic kidney disease, or unspecified chronic kidney disease: Secondary | ICD-10-CM | POA: Diagnosis not present

## 2016-05-12 DIAGNOSIS — M170 Bilateral primary osteoarthritis of knee: Secondary | ICD-10-CM | POA: Diagnosis not present

## 2016-05-12 DIAGNOSIS — N183 Chronic kidney disease, stage 3 (moderate): Secondary | ICD-10-CM | POA: Diagnosis not present

## 2016-05-12 DIAGNOSIS — I2510 Atherosclerotic heart disease of native coronary artery without angina pectoris: Secondary | ICD-10-CM | POA: Diagnosis not present

## 2016-05-14 DIAGNOSIS — I2510 Atherosclerotic heart disease of native coronary artery without angina pectoris: Secondary | ICD-10-CM | POA: Diagnosis not present

## 2016-05-14 DIAGNOSIS — I129 Hypertensive chronic kidney disease with stage 1 through stage 4 chronic kidney disease, or unspecified chronic kidney disease: Secondary | ICD-10-CM | POA: Diagnosis not present

## 2016-05-14 DIAGNOSIS — S82002D Unspecified fracture of left patella, subsequent encounter for closed fracture with routine healing: Secondary | ICD-10-CM | POA: Diagnosis not present

## 2016-05-14 DIAGNOSIS — N183 Chronic kidney disease, stage 3 (moderate): Secondary | ICD-10-CM | POA: Diagnosis not present

## 2016-05-14 DIAGNOSIS — M170 Bilateral primary osteoarthritis of knee: Secondary | ICD-10-CM | POA: Diagnosis not present

## 2016-05-14 DIAGNOSIS — S42412D Displaced simple supracondylar fracture without intercondylar fracture of left humerus, subsequent encounter for fracture with routine healing: Secondary | ICD-10-CM | POA: Diagnosis not present

## 2016-05-17 DIAGNOSIS — S82002D Unspecified fracture of left patella, subsequent encounter for closed fracture with routine healing: Secondary | ICD-10-CM | POA: Diagnosis not present

## 2016-05-17 DIAGNOSIS — S42412D Displaced simple supracondylar fracture without intercondylar fracture of left humerus, subsequent encounter for fracture with routine healing: Secondary | ICD-10-CM | POA: Diagnosis not present

## 2016-05-17 DIAGNOSIS — I129 Hypertensive chronic kidney disease with stage 1 through stage 4 chronic kidney disease, or unspecified chronic kidney disease: Secondary | ICD-10-CM | POA: Diagnosis not present

## 2016-05-17 DIAGNOSIS — I2510 Atherosclerotic heart disease of native coronary artery without angina pectoris: Secondary | ICD-10-CM | POA: Diagnosis not present

## 2016-05-17 DIAGNOSIS — M170 Bilateral primary osteoarthritis of knee: Secondary | ICD-10-CM | POA: Diagnosis not present

## 2016-05-17 DIAGNOSIS — N183 Chronic kidney disease, stage 3 (moderate): Secondary | ICD-10-CM | POA: Diagnosis not present

## 2016-05-19 ENCOUNTER — Ambulatory Visit (INDEPENDENT_AMBULATORY_CARE_PROVIDER_SITE_OTHER): Payer: Medicare Other | Admitting: Orthopaedic Surgery

## 2016-05-19 DIAGNOSIS — G8929 Other chronic pain: Secondary | ICD-10-CM

## 2016-05-19 DIAGNOSIS — M25562 Pain in left knee: Secondary | ICD-10-CM | POA: Diagnosis not present

## 2016-05-19 DIAGNOSIS — M1712 Unilateral primary osteoarthritis, left knee: Secondary | ICD-10-CM | POA: Diagnosis not present

## 2016-05-19 NOTE — Progress Notes (Signed)
Mr. Balboa is continue to follow-up for his left knee and his left elbow. He had a left supracondylar humerus fracture that is now since healed. He had a left nondisplaced patella fracture that is healed as well. He has known severe osteophytes of both his knees. He does get injections from time to time. He am pleased with a rolling walker. He says is not ready for injections today. He would like to continue his home health physical therapy though.  On examination of his left elbow is basically pain free with actually excellent range of motion of that elbow considering the injury that he had. He does have full motion of his left knee as well. His extensor mechanism is intact.  At this point he'll continue home therapy for gait training, balance, coordination and lower extremity strengthening. At this point he'll follow up as needed. We can always place steroid injections in his knees again if needed.

## 2016-05-20 DIAGNOSIS — S42412D Displaced simple supracondylar fracture without intercondylar fracture of left humerus, subsequent encounter for fracture with routine healing: Secondary | ICD-10-CM | POA: Diagnosis not present

## 2016-05-20 DIAGNOSIS — I2510 Atherosclerotic heart disease of native coronary artery without angina pectoris: Secondary | ICD-10-CM | POA: Diagnosis not present

## 2016-05-20 DIAGNOSIS — N183 Chronic kidney disease, stage 3 (moderate): Secondary | ICD-10-CM | POA: Diagnosis not present

## 2016-05-20 DIAGNOSIS — M170 Bilateral primary osteoarthritis of knee: Secondary | ICD-10-CM | POA: Diagnosis not present

## 2016-05-20 DIAGNOSIS — I129 Hypertensive chronic kidney disease with stage 1 through stage 4 chronic kidney disease, or unspecified chronic kidney disease: Secondary | ICD-10-CM | POA: Diagnosis not present

## 2016-05-20 DIAGNOSIS — S82002D Unspecified fracture of left patella, subsequent encounter for closed fracture with routine healing: Secondary | ICD-10-CM | POA: Diagnosis not present

## 2016-05-21 DIAGNOSIS — S82002D Unspecified fracture of left patella, subsequent encounter for closed fracture with routine healing: Secondary | ICD-10-CM | POA: Diagnosis not present

## 2016-05-21 DIAGNOSIS — S42412D Displaced simple supracondylar fracture without intercondylar fracture of left humerus, subsequent encounter for fracture with routine healing: Secondary | ICD-10-CM | POA: Diagnosis not present

## 2016-05-21 DIAGNOSIS — N183 Chronic kidney disease, stage 3 (moderate): Secondary | ICD-10-CM | POA: Diagnosis not present

## 2016-05-21 DIAGNOSIS — I2510 Atherosclerotic heart disease of native coronary artery without angina pectoris: Secondary | ICD-10-CM | POA: Diagnosis not present

## 2016-05-21 DIAGNOSIS — I129 Hypertensive chronic kidney disease with stage 1 through stage 4 chronic kidney disease, or unspecified chronic kidney disease: Secondary | ICD-10-CM | POA: Diagnosis not present

## 2016-05-21 DIAGNOSIS — M170 Bilateral primary osteoarthritis of knee: Secondary | ICD-10-CM | POA: Diagnosis not present

## 2016-05-25 DIAGNOSIS — Z79891 Long term (current) use of opiate analgesic: Secondary | ICD-10-CM | POA: Diagnosis not present

## 2016-05-25 DIAGNOSIS — I2510 Atherosclerotic heart disease of native coronary artery without angina pectoris: Secondary | ICD-10-CM | POA: Diagnosis not present

## 2016-05-25 DIAGNOSIS — G894 Chronic pain syndrome: Secondary | ICD-10-CM | POA: Diagnosis not present

## 2016-05-25 DIAGNOSIS — M25519 Pain in unspecified shoulder: Secondary | ICD-10-CM | POA: Diagnosis not present

## 2016-05-25 DIAGNOSIS — M170 Bilateral primary osteoarthritis of knee: Secondary | ICD-10-CM | POA: Diagnosis not present

## 2016-05-25 DIAGNOSIS — Z79899 Other long term (current) drug therapy: Secondary | ICD-10-CM | POA: Diagnosis not present

## 2016-05-25 DIAGNOSIS — S82002D Unspecified fracture of left patella, subsequent encounter for closed fracture with routine healing: Secondary | ICD-10-CM | POA: Diagnosis not present

## 2016-05-25 DIAGNOSIS — S42412D Displaced simple supracondylar fracture without intercondylar fracture of left humerus, subsequent encounter for fracture with routine healing: Secondary | ICD-10-CM | POA: Diagnosis not present

## 2016-05-25 DIAGNOSIS — N183 Chronic kidney disease, stage 3 (moderate): Secondary | ICD-10-CM | POA: Diagnosis not present

## 2016-05-25 DIAGNOSIS — M25559 Pain in unspecified hip: Secondary | ICD-10-CM | POA: Diagnosis not present

## 2016-05-25 DIAGNOSIS — I129 Hypertensive chronic kidney disease with stage 1 through stage 4 chronic kidney disease, or unspecified chronic kidney disease: Secondary | ICD-10-CM | POA: Diagnosis not present

## 2016-05-25 DIAGNOSIS — M25569 Pain in unspecified knee: Secondary | ICD-10-CM | POA: Diagnosis not present

## 2016-05-26 ENCOUNTER — Ambulatory Visit (INDEPENDENT_AMBULATORY_CARE_PROVIDER_SITE_OTHER): Payer: Medicare Other | Admitting: Orthopaedic Surgery

## 2016-05-26 DIAGNOSIS — N183 Chronic kidney disease, stage 3 (moderate): Secondary | ICD-10-CM | POA: Diagnosis not present

## 2016-05-26 DIAGNOSIS — I129 Hypertensive chronic kidney disease with stage 1 through stage 4 chronic kidney disease, or unspecified chronic kidney disease: Secondary | ICD-10-CM | POA: Diagnosis not present

## 2016-05-26 DIAGNOSIS — S82002D Unspecified fracture of left patella, subsequent encounter for closed fracture with routine healing: Secondary | ICD-10-CM | POA: Diagnosis not present

## 2016-05-26 DIAGNOSIS — I2510 Atherosclerotic heart disease of native coronary artery without angina pectoris: Secondary | ICD-10-CM | POA: Diagnosis not present

## 2016-05-26 DIAGNOSIS — M170 Bilateral primary osteoarthritis of knee: Secondary | ICD-10-CM | POA: Diagnosis not present

## 2016-05-26 DIAGNOSIS — S42412D Displaced simple supracondylar fracture without intercondylar fracture of left humerus, subsequent encounter for fracture with routine healing: Secondary | ICD-10-CM | POA: Diagnosis not present

## 2016-05-27 DIAGNOSIS — N183 Chronic kidney disease, stage 3 (moderate): Secondary | ICD-10-CM | POA: Diagnosis not present

## 2016-05-27 DIAGNOSIS — M170 Bilateral primary osteoarthritis of knee: Secondary | ICD-10-CM | POA: Diagnosis not present

## 2016-05-27 DIAGNOSIS — I2510 Atherosclerotic heart disease of native coronary artery without angina pectoris: Secondary | ICD-10-CM | POA: Diagnosis not present

## 2016-05-27 DIAGNOSIS — S82002D Unspecified fracture of left patella, subsequent encounter for closed fracture with routine healing: Secondary | ICD-10-CM | POA: Diagnosis not present

## 2016-05-27 DIAGNOSIS — I129 Hypertensive chronic kidney disease with stage 1 through stage 4 chronic kidney disease, or unspecified chronic kidney disease: Secondary | ICD-10-CM | POA: Diagnosis not present

## 2016-05-27 DIAGNOSIS — S42412D Displaced simple supracondylar fracture without intercondylar fracture of left humerus, subsequent encounter for fracture with routine healing: Secondary | ICD-10-CM | POA: Diagnosis not present

## 2016-06-02 DIAGNOSIS — I2510 Atherosclerotic heart disease of native coronary artery without angina pectoris: Secondary | ICD-10-CM | POA: Diagnosis not present

## 2016-06-02 DIAGNOSIS — S82002D Unspecified fracture of left patella, subsequent encounter for closed fracture with routine healing: Secondary | ICD-10-CM | POA: Diagnosis not present

## 2016-06-02 DIAGNOSIS — I129 Hypertensive chronic kidney disease with stage 1 through stage 4 chronic kidney disease, or unspecified chronic kidney disease: Secondary | ICD-10-CM | POA: Diagnosis not present

## 2016-06-02 DIAGNOSIS — N183 Chronic kidney disease, stage 3 (moderate): Secondary | ICD-10-CM | POA: Diagnosis not present

## 2016-06-02 DIAGNOSIS — S42412D Displaced simple supracondylar fracture without intercondylar fracture of left humerus, subsequent encounter for fracture with routine healing: Secondary | ICD-10-CM | POA: Diagnosis not present

## 2016-06-02 DIAGNOSIS — M170 Bilateral primary osteoarthritis of knee: Secondary | ICD-10-CM | POA: Diagnosis not present

## 2016-06-03 DIAGNOSIS — S82002D Unspecified fracture of left patella, subsequent encounter for closed fracture with routine healing: Secondary | ICD-10-CM | POA: Diagnosis not present

## 2016-06-03 DIAGNOSIS — I129 Hypertensive chronic kidney disease with stage 1 through stage 4 chronic kidney disease, or unspecified chronic kidney disease: Secondary | ICD-10-CM | POA: Diagnosis not present

## 2016-06-03 DIAGNOSIS — N183 Chronic kidney disease, stage 3 (moderate): Secondary | ICD-10-CM | POA: Diagnosis not present

## 2016-06-03 DIAGNOSIS — M170 Bilateral primary osteoarthritis of knee: Secondary | ICD-10-CM | POA: Diagnosis not present

## 2016-06-03 DIAGNOSIS — S42412D Displaced simple supracondylar fracture without intercondylar fracture of left humerus, subsequent encounter for fracture with routine healing: Secondary | ICD-10-CM | POA: Diagnosis not present

## 2016-06-03 DIAGNOSIS — I2510 Atherosclerotic heart disease of native coronary artery without angina pectoris: Secondary | ICD-10-CM | POA: Diagnosis not present

## 2016-06-07 DIAGNOSIS — S42412D Displaced simple supracondylar fracture without intercondylar fracture of left humerus, subsequent encounter for fracture with routine healing: Secondary | ICD-10-CM | POA: Diagnosis not present

## 2016-06-07 DIAGNOSIS — N183 Chronic kidney disease, stage 3 (moderate): Secondary | ICD-10-CM | POA: Diagnosis not present

## 2016-06-07 DIAGNOSIS — M170 Bilateral primary osteoarthritis of knee: Secondary | ICD-10-CM | POA: Diagnosis not present

## 2016-06-07 DIAGNOSIS — I2510 Atherosclerotic heart disease of native coronary artery without angina pectoris: Secondary | ICD-10-CM | POA: Diagnosis not present

## 2016-06-07 DIAGNOSIS — S82002D Unspecified fracture of left patella, subsequent encounter for closed fracture with routine healing: Secondary | ICD-10-CM | POA: Diagnosis not present

## 2016-06-07 DIAGNOSIS — I129 Hypertensive chronic kidney disease with stage 1 through stage 4 chronic kidney disease, or unspecified chronic kidney disease: Secondary | ICD-10-CM | POA: Diagnosis not present

## 2016-06-08 DIAGNOSIS — S82002D Unspecified fracture of left patella, subsequent encounter for closed fracture with routine healing: Secondary | ICD-10-CM | POA: Diagnosis not present

## 2016-06-08 DIAGNOSIS — S42412D Displaced simple supracondylar fracture without intercondylar fracture of left humerus, subsequent encounter for fracture with routine healing: Secondary | ICD-10-CM | POA: Diagnosis not present

## 2016-06-08 DIAGNOSIS — M170 Bilateral primary osteoarthritis of knee: Secondary | ICD-10-CM | POA: Diagnosis not present

## 2016-06-08 DIAGNOSIS — I2510 Atherosclerotic heart disease of native coronary artery without angina pectoris: Secondary | ICD-10-CM | POA: Diagnosis not present

## 2016-06-08 DIAGNOSIS — I129 Hypertensive chronic kidney disease with stage 1 through stage 4 chronic kidney disease, or unspecified chronic kidney disease: Secondary | ICD-10-CM | POA: Diagnosis not present

## 2016-06-08 DIAGNOSIS — N183 Chronic kidney disease, stage 3 (moderate): Secondary | ICD-10-CM | POA: Diagnosis not present

## 2016-06-09 ENCOUNTER — Encounter: Payer: Self-pay | Admitting: Cardiology

## 2016-06-10 DIAGNOSIS — I2510 Atherosclerotic heart disease of native coronary artery without angina pectoris: Secondary | ICD-10-CM | POA: Diagnosis not present

## 2016-06-10 DIAGNOSIS — N183 Chronic kidney disease, stage 3 (moderate): Secondary | ICD-10-CM | POA: Diagnosis not present

## 2016-06-10 DIAGNOSIS — S82002D Unspecified fracture of left patella, subsequent encounter for closed fracture with routine healing: Secondary | ICD-10-CM | POA: Diagnosis not present

## 2016-06-10 DIAGNOSIS — S42412D Displaced simple supracondylar fracture without intercondylar fracture of left humerus, subsequent encounter for fracture with routine healing: Secondary | ICD-10-CM | POA: Diagnosis not present

## 2016-06-10 DIAGNOSIS — I129 Hypertensive chronic kidney disease with stage 1 through stage 4 chronic kidney disease, or unspecified chronic kidney disease: Secondary | ICD-10-CM | POA: Diagnosis not present

## 2016-06-10 DIAGNOSIS — M170 Bilateral primary osteoarthritis of knee: Secondary | ICD-10-CM | POA: Diagnosis not present

## 2016-06-17 DIAGNOSIS — S82002D Unspecified fracture of left patella, subsequent encounter for closed fracture with routine healing: Secondary | ICD-10-CM | POA: Diagnosis not present

## 2016-06-17 DIAGNOSIS — I129 Hypertensive chronic kidney disease with stage 1 through stage 4 chronic kidney disease, or unspecified chronic kidney disease: Secondary | ICD-10-CM | POA: Diagnosis not present

## 2016-06-17 DIAGNOSIS — I2510 Atherosclerotic heart disease of native coronary artery without angina pectoris: Secondary | ICD-10-CM | POA: Diagnosis not present

## 2016-06-17 DIAGNOSIS — S42412D Displaced simple supracondylar fracture without intercondylar fracture of left humerus, subsequent encounter for fracture with routine healing: Secondary | ICD-10-CM | POA: Diagnosis not present

## 2016-06-17 DIAGNOSIS — M170 Bilateral primary osteoarthritis of knee: Secondary | ICD-10-CM | POA: Diagnosis not present

## 2016-06-17 DIAGNOSIS — N183 Chronic kidney disease, stage 3 (moderate): Secondary | ICD-10-CM | POA: Diagnosis not present

## 2016-06-21 ENCOUNTER — Ambulatory Visit: Payer: Medicare Other | Admitting: Cardiology

## 2016-06-23 DIAGNOSIS — S42412D Displaced simple supracondylar fracture without intercondylar fracture of left humerus, subsequent encounter for fracture with routine healing: Secondary | ICD-10-CM | POA: Diagnosis not present

## 2016-06-23 DIAGNOSIS — N183 Chronic kidney disease, stage 3 (moderate): Secondary | ICD-10-CM | POA: Diagnosis not present

## 2016-06-23 DIAGNOSIS — I2510 Atherosclerotic heart disease of native coronary artery without angina pectoris: Secondary | ICD-10-CM | POA: Diagnosis not present

## 2016-06-23 DIAGNOSIS — M170 Bilateral primary osteoarthritis of knee: Secondary | ICD-10-CM | POA: Diagnosis not present

## 2016-06-23 DIAGNOSIS — I129 Hypertensive chronic kidney disease with stage 1 through stage 4 chronic kidney disease, or unspecified chronic kidney disease: Secondary | ICD-10-CM | POA: Diagnosis not present

## 2016-06-23 DIAGNOSIS — S82002D Unspecified fracture of left patella, subsequent encounter for closed fracture with routine healing: Secondary | ICD-10-CM | POA: Diagnosis not present

## 2016-07-19 DIAGNOSIS — E7800 Pure hypercholesterolemia, unspecified: Secondary | ICD-10-CM | POA: Diagnosis not present

## 2016-07-19 DIAGNOSIS — R413 Other amnesia: Secondary | ICD-10-CM | POA: Diagnosis not present

## 2016-07-19 DIAGNOSIS — I25810 Atherosclerosis of coronary artery bypass graft(s) without angina pectoris: Secondary | ICD-10-CM | POA: Diagnosis not present

## 2016-07-19 DIAGNOSIS — N183 Chronic kidney disease, stage 3 (moderate): Secondary | ICD-10-CM | POA: Diagnosis not present

## 2016-07-20 DIAGNOSIS — Z79899 Other long term (current) drug therapy: Secondary | ICD-10-CM | POA: Diagnosis not present

## 2016-07-20 DIAGNOSIS — M25559 Pain in unspecified hip: Secondary | ICD-10-CM | POA: Diagnosis not present

## 2016-07-20 DIAGNOSIS — G894 Chronic pain syndrome: Secondary | ICD-10-CM | POA: Diagnosis not present

## 2016-07-20 DIAGNOSIS — M25569 Pain in unspecified knee: Secondary | ICD-10-CM | POA: Diagnosis not present

## 2016-07-20 DIAGNOSIS — M25519 Pain in unspecified shoulder: Secondary | ICD-10-CM | POA: Diagnosis not present

## 2016-07-20 DIAGNOSIS — Z79891 Long term (current) use of opiate analgesic: Secondary | ICD-10-CM | POA: Diagnosis not present

## 2016-08-12 ENCOUNTER — Other Ambulatory Visit: Payer: Self-pay

## 2016-08-12 ENCOUNTER — Ambulatory Visit (INDEPENDENT_AMBULATORY_CARE_PROVIDER_SITE_OTHER): Payer: Medicare Other | Admitting: Cardiology

## 2016-08-12 ENCOUNTER — Encounter (INDEPENDENT_AMBULATORY_CARE_PROVIDER_SITE_OTHER): Payer: Self-pay

## 2016-08-12 ENCOUNTER — Encounter: Payer: Self-pay | Admitting: Cardiology

## 2016-08-12 VITALS — BP 144/50 | HR 63 | Ht 70.5 in | Wt 146.8 lb

## 2016-08-12 DIAGNOSIS — Z953 Presence of xenogenic heart valve: Secondary | ICD-10-CM

## 2016-08-12 DIAGNOSIS — Z951 Presence of aortocoronary bypass graft: Secondary | ICD-10-CM | POA: Diagnosis not present

## 2016-08-12 DIAGNOSIS — I2583 Coronary atherosclerosis due to lipid rich plaque: Secondary | ICD-10-CM | POA: Diagnosis not present

## 2016-08-12 DIAGNOSIS — I10 Essential (primary) hypertension: Secondary | ICD-10-CM | POA: Diagnosis not present

## 2016-08-12 DIAGNOSIS — I2510 Atherosclerotic heart disease of native coronary artery without angina pectoris: Secondary | ICD-10-CM

## 2016-08-12 MED ORDER — AMLODIPINE BESYLATE 2.5 MG PO TABS: 2.5000 mg | ORAL_TABLET | Freq: Every day | ORAL | 3 refills | Status: DC

## 2016-08-12 NOTE — Progress Notes (Signed)
Cardiology Office Note    Date:  08/12/2016   ID:  Michael Blanchard, DOB [birth year], MRN [id number]  PCP:  Seward Carol, MD  Cardiologist:   Candee Furbish, MD   Chief Complaint  Patient presents with  . Follow-up    History of Present Illness:  Michael Blanchard is a 83 y.o. male for a patient of Dr. Sherryl Barters here with his wife today has a history of ischemic heart disease and valvular heart disease. He had coronary artery bypass graft surgery as well as aortic valve replacement using a tissue valve in 2001. He has done well postoperatively. He had a nuclear stress test in 2007 showing no evidence of ischemia. His ejection fraction was 59%. He had an echocardiogram in February 2010 showing normal LV systolic function with impaired relaxation and showed normal tissue aortic valve replacement with no aortic insufficiency. He has a history of high blood pressure and history of hypercholesterolemia. Since last visit he has been doing well except for chronic arthritis of both of his knees.he has been getting steroid shots in his knees at intervals by Dr. Jean Rosenthal.   His primary care provider is Dr. Delfina Redwood. For exercise he goes to the gym several times a week and uses an exercise bicycle.  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  He went on to have successful surgery with metal of the tumor by Dr. Michael Boston.  He has not been expressing any chest pain.  He still has exertional dyspnea.  He states that he is no longer taking oral iron.  All other medicines are the same he states.  However his daughter is in charge of his medicines.  Daughter, Michael Blanchard, has been helping them for several years. She used to run an agency that provided long-term care for people in home. [name] had knee replacement.   08/12/16 - left knee cap and elbow fracture. Struggling at times. Overall denies any chest pain, no syncope. He  revisited the story about his bypass and aortic valve replacement and his wife's I pass and aortic valve replacement only a few months after his.   Past Medical History:  Diagnosis Date  . Arthritis    bil knees, right shoulder  . AVD (aortic valve disease)   . Chronic kidney disease    stage 3 kidney disease  . Complication of anesthesia    family states pt memory was bad 3-4 weeks after surgery  . Coronary atherosclerosis   . Fatigue   . Hyperlipidemia   . Hypertension   . Stroke [facility])    "I had a very milk sstroke one time the doctor said", current mini strokes  . Trigeminal neuralgia     Past Surgical History:  Procedure Laterality Date  . AORTIC VALVE REPLACEMENT     Carpentier-Edwards Pericardial Valve, MR Safe 1.5T  . CARDIAC CATHETERIZATION  06/22/1999   SEVERE HYPOKINESIA OF THE MID TO DISTAL INFERIOR WALL AND APEX.MODERATE LEFT VENTRICULAR DYSFUNCTION WITH EF 45%  . COLONOSCOPY N/A 01/12/2015   Procedure: COLONOSCOPY;  Surgeon: Wilford Corner, MD;  Location: [facility] ENDOSCOPY;  Service: Endoscopy;  Laterality: N/A;  . CORONARY ARTERY BYPASS GRAFT  2001  . ESOPHAGOGASTRODUODENOSCOPY N/A 01/10/2015   Procedure: ESOPHAGOGASTRODUODENOSCOPY (EGD);  Surgeon: Clarene Essex, MD;  Location: [facility] ENDOSCOPY;  Service: Endoscopy;  Laterality: N/A;    Current Medications: Outpatient Medications Prior to Visit  Medication Sig  Dispense Refill  . amLODipine (NORVASC) 2.5 MG tablet Take 1 tablet (2.5 mg total) by mouth daily. 90 tablet 3  . Calcium Carbonate-Vitamin D (CALCIUM 600 + D PO) Take 1 tablet by mouth at bedtime.     . carbamazepine (TEGRETOL) 200 MG tablet Take 1 tablet (200 mg total) by mouth 2 (two) times daily. 180 tablet 4  . clopidogrel (PLAVIX) 75 MG tablet Take 1 tablet (75 mg total) by mouth daily. 30 tablet 11  . CRANBERRY PO Take 4,200 mg by mouth 2 (two) times daily.    Marland Kitchen donepezil (ARICEPT) 10 MG tablet Take 1 tablet (10 mg total) by mouth at bedtime. 90 tablet 4  .  losartan-hydrochlorothiazide (HYZAAR) 100-25 MG tablet Take 1 tablet by mouth daily. 90 tablet 3  . metoprolol (LOPRESSOR) 50 MG tablet Take 1 tablet (50 mg total) by mouth 2 (two) times daily. 180 tablet 3  . Misc Natural Products (OSTEO BI-FLEX ADV DOUBLE ST PO) Take 1 tablet by mouth 2 (two) times daily.    . Multiple Vitamins-Minerals (CENTRUM SILVER 50+MEN PO) Take 1 tablet by mouth daily.    . nitroGLYCERIN (NITROSTAT) 0.4 MG SL tablet Place 1 tablet (0.4 mg total) under the tongue every 5 (five) minutes as needed for chest pain. 25 tablet 3  . polyethylene glycol (MIRALAX / GLYCOLAX) packet Take 17 g by mouth daily as needed for moderate constipation.     . sennosides-docusate sodium (SENOKOT-S) 8.6-50 MG tablet Take 1 tablet by mouth 3 (three) times daily as needed for constipation (when taking Norco).     . tamsulosin (FLOMAX) 0.4 MG CAPS capsule Take 0.4 mg by mouth daily with lunch.   11  . HYDROcodone-acetaminophen (NORCO) 7.5-325 MG tablet Take 1 tablet by mouth every 4 (four) hours as needed for moderate pain or severe pain (pain). (Patient not taking: Reported on 08/12/2016) 40 tablet 0  . rosuvastatin (CRESTOR) 10 MG tablet Take 0.5 tablets (5 mg total) by mouth See admin instructions. Take 3 times a week on Tuesdays, Thursdays, Saturdays (Patient not taking: Reported on 08/12/2016) 20 tablet 1   No facility-administered medications prior to visit.      Allergies:   Gabapentin; Lipitor [atorvastatin calcium]; and Niaspan [niacin]   Social History   Social History  . Marital status: Married    Spouse name: Murray Hodgkins  . Number of children: 4  . Years of education: 12   Occupational History  .  Retired    retired   Social History Main Topics  . Smoking status: Former Smoker    Quit date: 09/13/1985  . Smokeless tobacco: Never Used  . Alcohol use No  . Drug use: No  . Sexual activity: Not Asked   Other Topics Concern  . None   Social History Narrative   Pt lives at home  with his spouse. [name]) [name]   Caffeine Use: - Two cups of coffee daily and one pepsi daily.   Right handed.   Education- High school.     Family History:  The patient's family history includes Aneurysm in his father and mother; Heart disease in his brother; Stroke in his mother.   ROS:   Please see the history of present illness.    Review of Systems  All other systems reviewed and are negative.    PHYSICAL EXAM:   VS:  BP (!) 144/50   Pulse 63   Ht 5' 10.5" (1.791 m)   Wt 146 lb 12.8 oz (66.6 kg)  BMI 20.77 kg/m    GEN: Well nourished, well developed, in no acute distress  HEENT: normal  Neck: no JVD, carotid bruits, or masses Cardiac: RRR; Soft systolic murmurs, no rubs, or gallops,no edema  Respiratory:  clear to auscultation bilaterally, normal work of breathing GI: soft, nontender, nondistended, + BS MS: no deformity or atrophy  Skin: warm and dry, no rash Neuro:  Alert and Oriented x 3, Strength and sensation are intact Psych: euthymic mood, full affect  Wt Readings from Last 3 Encounters:  08/12/16 146 lb 12.8 oz (66.6 kg)  11/04/15 162 lb 4 oz (73.6 kg)  10/10/15 165 lb 6.4 oz (75 kg)      Studies/Labs Reviewed:   EKG: Today 08/12/16-sinus rhythm PVCs interventricular conduction delay, LVH, left bundle branch block like. Personally viewed  Recent Labs: No results found for requested labs within last 8760 hours.   Lipid Panel    Component Value Date/Time   CHOL 159 05/22/2012 1031   TRIG 131.0 05/22/2012 1031   HDL 43.80 05/22/2012 1031   CHOLHDL 4 05/22/2012 1031   VLDL 26.2 05/22/2012 1031   LDLCALC 89 05/22/2012 1031    Additional studies/ records that were reviewed today include:  Prior lab work, EKGs, office notes reviewed   ASSESSMENT:    1. Essential hypertension   2. Hx of CABG   3. S/P aortic valve replacement with bioprosthetic valve   4. Coronary artery disease due to lipid rich plaque      PLAN:  In order of problems listed  above:  Ischemic heart disease status post CABG in 2001  - No anginal symptoms, stable, no CP  History of aortic valve replacement, tissue valve 2001  - Interestingly his wife had aortic valve replacement same year.  - EF 60%, moderate LVH, normal bioprosthetic valve. Moderate MR. Doing well  Essential hypertension without heart failure  - Stable, no changes  Previous history of severe anemia  - In October 2016, hemoglobin 6.4, cecal mass removed by Dr. Michael Boston. No postoperative chemotherapy was indicated. Been stable. No melena.  Hyperlipidemia  - Followed by Dr. Delfina Redwood, doing well.  Knee osteoarthritis  - Doing well. No surgery.  Medication Adjustments/Labs and Tests Ordered: Current medicines are reviewed at length with the patient today.  Concerns regarding medicines are outlined above.  Medication changes, Labs and Tests ordered today are listed in the Patient Instructions below. Patient Instructions  Medication Instructions:  The current medical regimen is effective;  continue present plan and medications.  Follow-Up: Follow up in 1 year with Dr. Marlou Porch.  You will receive a letter in the mail 2 months before you are due.  Please call us when you receive this letter to schedule your follow up appointment.  If you need a refill on your cardiac medications before your next appointment, please call your pharmacy.  Thank you for choosing [facility]!!        Signed, Candee Furbish, MD  08/12/2016 4:58 PM    [address], [place], [place]  [address] Phone: [phone]; Fax: [phone]

## 2016-08-12 NOTE — Patient Instructions (Signed)

## 2016-08-16 ENCOUNTER — Other Ambulatory Visit: Payer: Self-pay | Admitting: Neurology

## 2016-08-16 MED ORDER — CLOPIDOGREL BISULFATE 75 MG PO TABS: 75.0000 mg | ORAL_TABLET | Freq: Every day | ORAL | 4 refills | Status: DC

## 2016-08-16 NOTE — Addendum Note (Signed)
Addended by: Marcial Pacas on: 08/16/2016 03:45 PM   Modules accepted: Orders

## 2016-08-16 NOTE — Telephone Encounter (Signed)
     [address] [place], [place] [phone]   I have faxed plavix 75mg  po qday x90 days supple with 4 refills

## 2016-08-16 NOTE — Telephone Encounter (Signed)
[facility] [phone]  (f) [phone] REF # [id number] request refill for clopidogrel (PLAVIX) 75 MG tablet.

## 2016-09-14 DIAGNOSIS — G894 Chronic pain syndrome: Secondary | ICD-10-CM | POA: Diagnosis not present

## 2016-09-14 DIAGNOSIS — M25559 Pain in unspecified hip: Secondary | ICD-10-CM | POA: Diagnosis not present

## 2016-09-14 DIAGNOSIS — M25569 Pain in unspecified knee: Secondary | ICD-10-CM | POA: Diagnosis not present

## 2016-09-14 DIAGNOSIS — M25519 Pain in unspecified shoulder: Secondary | ICD-10-CM | POA: Diagnosis not present

## 2016-10-15 DIAGNOSIS — R351 Nocturia: Secondary | ICD-10-CM | POA: Diagnosis not present

## 2016-10-15 DIAGNOSIS — R350 Frequency of micturition: Secondary | ICD-10-CM | POA: Diagnosis not present

## 2016-11-16 DIAGNOSIS — M25559 Pain in unspecified hip: Secondary | ICD-10-CM | POA: Diagnosis not present

## 2016-11-16 DIAGNOSIS — M25569 Pain in unspecified knee: Secondary | ICD-10-CM | POA: Diagnosis not present

## 2016-11-16 DIAGNOSIS — Z79899 Other long term (current) drug therapy: Secondary | ICD-10-CM | POA: Diagnosis not present

## 2016-11-16 DIAGNOSIS — G894 Chronic pain syndrome: Secondary | ICD-10-CM | POA: Diagnosis not present

## 2016-11-16 DIAGNOSIS — M25519 Pain in unspecified shoulder: Secondary | ICD-10-CM | POA: Diagnosis not present

## 2016-11-16 DIAGNOSIS — Z79891 Long term (current) use of opiate analgesic: Secondary | ICD-10-CM | POA: Diagnosis not present

## 2016-12-06 DIAGNOSIS — C180 Malignant neoplasm of cecum: Secondary | ICD-10-CM | POA: Diagnosis not present

## 2016-12-06 DIAGNOSIS — G500 Trigeminal neuralgia: Secondary | ICD-10-CM | POA: Diagnosis not present

## 2016-12-06 DIAGNOSIS — R413 Other amnesia: Secondary | ICD-10-CM | POA: Diagnosis not present

## 2016-12-06 DIAGNOSIS — N183 Chronic kidney disease, stage 3 (moderate): Secondary | ICD-10-CM | POA: Diagnosis not present

## 2016-12-06 DIAGNOSIS — Z23 Encounter for immunization: Secondary | ICD-10-CM | POA: Diagnosis not present

## 2016-12-06 DIAGNOSIS — G894 Chronic pain syndrome: Secondary | ICD-10-CM | POA: Diagnosis not present

## 2016-12-06 DIAGNOSIS — Z954 Presence of other heart-valve replacement: Secondary | ICD-10-CM | POA: Diagnosis not present

## 2016-12-06 DIAGNOSIS — E7800 Pure hypercholesterolemia, unspecified: Secondary | ICD-10-CM | POA: Diagnosis not present

## 2016-12-06 DIAGNOSIS — I2510 Atherosclerotic heart disease of native coronary artery without angina pectoris: Secondary | ICD-10-CM | POA: Diagnosis not present

## 2016-12-09 ENCOUNTER — Other Ambulatory Visit: Payer: Self-pay | Admitting: *Deleted

## 2016-12-09 DIAGNOSIS — G500 Trigeminal neuralgia: Secondary | ICD-10-CM

## 2016-12-09 MED ORDER — CARBAMAZEPINE 200 MG PO TABS: 200.0000 mg | ORAL_TABLET | Freq: Two times a day (BID) | ORAL | 0 refills | Status: DC

## 2017-01-10 ENCOUNTER — Telehealth: Payer: Self-pay | Admitting: Neurology

## 2017-01-10 DIAGNOSIS — G500 Trigeminal neuralgia: Secondary | ICD-10-CM

## 2017-01-10 MED ORDER — CARBAMAZEPINE 200 MG PO TABS: 200.0000 mg | ORAL_TABLET | Freq: Two times a day (BID) | ORAL | 0 refills | Status: DC

## 2017-01-10 NOTE — Telephone Encounter (Signed)
Pt daughter(on DPR) called for refill of carbamazepine (TEGRETOL) 200 MG tablet, please send thru [place], [address] [phone] (Phone) [phone] (Fax)

## 2017-01-10 NOTE — Addendum Note (Signed)
Addended by: Noberto Retort C on: 01/10/2017 03:00 PM   Modules accepted: Orders

## 2017-01-10 NOTE — Telephone Encounter (Signed)
Spoke to patient's daughter on HIPAA - pharmacy updated to [facility] order in [place].  She will have her father call to schedule an appt to continue refills.

## 2017-01-21 ENCOUNTER — Other Ambulatory Visit: Payer: Self-pay | Admitting: *Deleted

## 2017-01-21 MED ORDER — LOSARTAN POTASSIUM-HCTZ 100-25 MG PO TABS: 1.0000 | ORAL_TABLET | Freq: Every day | ORAL | 1 refills | Status: DC

## 2017-01-21 MED ORDER — METOPROLOL TARTRATE 50 MG PO TABS: 50.0000 mg | ORAL_TABLET | Freq: Two times a day (BID) | ORAL | 1 refills | Status: DC

## 2017-01-26 ENCOUNTER — Encounter: Payer: Self-pay | Admitting: Neurology

## 2017-01-26 ENCOUNTER — Ambulatory Visit (INDEPENDENT_AMBULATORY_CARE_PROVIDER_SITE_OTHER): Payer: Medicare Other | Admitting: Neurology

## 2017-01-26 VITALS — BP 155/64 | HR 59 | Ht 70.5 in | Wt 166.0 lb

## 2017-01-26 DIAGNOSIS — I2510 Atherosclerotic heart disease of native coronary artery without angina pectoris: Secondary | ICD-10-CM | POA: Diagnosis not present

## 2017-01-26 DIAGNOSIS — I2583 Coronary atherosclerosis due to lipid rich plaque: Secondary | ICD-10-CM | POA: Diagnosis not present

## 2017-01-26 DIAGNOSIS — M25559 Pain in unspecified hip: Secondary | ICD-10-CM | POA: Diagnosis not present

## 2017-01-26 DIAGNOSIS — M25519 Pain in unspecified shoulder: Secondary | ICD-10-CM | POA: Diagnosis not present

## 2017-01-26 DIAGNOSIS — M25569 Pain in unspecified knee: Secondary | ICD-10-CM | POA: Diagnosis not present

## 2017-01-26 DIAGNOSIS — G500 Trigeminal neuralgia: Secondary | ICD-10-CM

## 2017-01-26 DIAGNOSIS — G894 Chronic pain syndrome: Secondary | ICD-10-CM | POA: Diagnosis not present

## 2017-01-26 MED ORDER — CARBAMAZEPINE 200 MG PO TABS: 200.0000 mg | ORAL_TABLET | Freq: Two times a day (BID) | ORAL | 4 refills | Status: DC

## 2017-01-26 NOTE — Progress Notes (Addendum)
Chief Complaint  Patient presents with  . Trigeminal Neuralgia    He has continued taking Tegretol 200mg , BID which keeps his pain tolerable.   Subjective:    Patient ID: Michael Blanchard is a 83 y.o. male.  Michael Blanchard is a very pleasant 40 -year-old right-handed gentleman who presents for followup consultation of his memory loss, trigeminal neuralgia, and prior history of TIA.  He has past medical history of hypertension, hyperlipidemia, heart disease, status post aortic valve replacement and five-vessel CABG 25 years ago, trigeminal neuralgia, arthritis and memory loss.   He had a history of trigeminal neuralgia for more than 2 decades. He was first treated with gabapentin for this. He was then placed on carbamazepine. He started having memory problems.   In April 2013 he had a TIA with weakness and fall. He was on a baby aspirin daily and Plavix was added. 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 There was kinking of the left ICA. MRA brain showed diffuse atherosclerotic disease with severe narrowing of the bilateral internal carotid arteries at the siphon and moderate to severe stenosis of the inferior basilar artery and focal stenosis of the right PCA.   MRI brain showed mild diffuse and moderate mesial temporal atrophy and chronic left cerebellar ischemic infarct.  In January 2014 his MMSE was 28, clock drawing was 4, animal fluency was 15.  He states his trigeminal neuralgia is stable. He is taking carbamazepine 200 mg daily, he still has occasionally left upper eyelid, left nasal bridge shooting pain, he is also on frequent oxycodone, hydrocodone for his chronic bilateral knee pain, and low back pain, he is happy about current pain control,   Last Tegretol level was 3.4 in July 2014  UPDATE Jan 7th 2016: He drove himself to office today, he lives with his wife, with the help of his children, he has mild gait difficulty due to joints pain, he only has occasionally  recurrent left trigeminal facial pain, doing well on current Tegretol 200 mg twice a day, does not want to make any change, also need refill of his Plavix.  UPDATE May 07 2015: He had robotically assisted proximal RIGHT colectomy in December. 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 he complains of gait abnormality due to low back pain, bilateral knee pain  Update 02/09/2017 His wife passed away, his facial pain is under good control with carbamazepine 200 mg twice a day,  Review of system: As above  Past Medical History Is Significant For: Past Medical History:  Diagnosis Date  . Arthritis    bil knees, right shoulder  . AVD (aortic valve disease)   . Chronic kidney disease    stage 3 kidney disease  . Complication of anesthesia    family states pt memory was bad 3-4 weeks after surgery  . Coronary atherosclerosis   . Fatigue   . Hyperlipidemia   . Hypertension   . Stroke [facility])    "I had a very milk sstroke one time the doctor said", current mini strokes  . Trigeminal neuralgia     His Past Surgical History Is Significant For: Past Surgical History:  Procedure Laterality Date  .  AORTIC VALVE REPLACEMENT     Carpentier-Edwards Pericardial Valve, MR Safe 1.5T  . CARDIAC CATHETERIZATION  06/22/1999   SEVERE HYPOKINESIA OF THE MID TO DISTAL INFERIOR WALL AND APEX.MODERATE LEFT VENTRICULAR DYSFUNCTION WITH EF 45%  . CORONARY ARTERY BYPASS GRAFT  2001    His Family History Is Significant For: Family History  Problem Relation Age of Onset  . Stroke Mother   . Aneurysm Mother   . Aneurysm Father   . Heart disease Brother     His Social  History Is Significant For: Social History   Socioeconomic History  . Marital status: Married    Spouse name: Murray Hodgkins  . Number of children: 4  . Years of education: 82  . Highest education level: None  Social Needs  . Financial resource strain: None  . Food insecurity - worry: None  . Food insecurity - inability: None  . Transportation needs - medical: None  . Transportation needs - non-medical: None  Occupational History    Employer: RETIRED    Comment: retired  Tobacco Use  . Smoking status: Former Smoker    Last attempt to quit: 09/13/1985    Years since quitting: 31.3  . Smokeless tobacco: Never Used  Substance and Sexual Activity  . Alcohol use: No  . Drug use: No  . Sexual activity: None  Other Topics Concern  . None  Social History Narrative   Pt lives at home with his spouse. [name]) [name]   Caffeine Use: - Two cups of coffee daily and one pepsi daily.   Right handed.   Education- High school.    His Allergies Are:  Allergies  Allergen Reactions  . Gabapentin     Unknown rxn  . Lipitor [Atorvastatin Calcium]     Unknown rxn  . Niaspan [Niacin]     Unknown rxn  :   His Current Medications Are:  Outpatient Encounter Medications as of 01/26/2017  Medication Sig  . amLODipine (NORVASC) 2.5 MG tablet Take 1 tablet (2.5 mg total) by mouth daily.  . Calcium Carbonate-Vitamin D (CALCIUM 600 + D PO) Take 1 tablet by mouth at bedtime.   . carbamazepine (TEGRETOL) 200 MG tablet Take 1 tablet (200 mg total) by mouth 2 (two) times daily. Please call [phone]  to schedule an appt.  . clopidogrel (PLAVIX) 75 MG tablet Take 1 tablet (75 mg total) by mouth daily.  Marland Kitchen CRANBERRY PO Take 4,200 mg by mouth 2 (two) times daily.  Marland Kitchen donepezil (ARICEPT) 10 MG tablet Take 1 tablet (10 mg total) by mouth at bedtime.  Marland Kitchen HYDROcodone-acetaminophen (NORCO) 7.5-325 MG tablet Take 1 tablet by mouth every 8 (eight) hours as needed for moderate pain.  Marland Kitchen losartan-hydrochlorothiazide (HYZAAR)  100-25 MG tablet Take 1 tablet daily by mouth.  . metoprolol tartrate (LOPRESSOR) 50 MG tablet Take 1 tablet (50 mg total) 2 (two) times daily by mouth.  . Misc Natural Products (OSTEO BI-FLEX ADV DOUBLE ST PO) Take 1 tablet by mouth 2 (two) times daily.  . Multiple Vitamins-Minerals (CENTRUM SILVER 50+MEN PO) Take 1 tablet by mouth daily.  . nitroGLYCERIN (NITROSTAT) 0.4 MG SL tablet Place 1 tablet (0.4 mg total) under the tongue every 5 (five) minutes as needed for chest pain.  . polyethylene glycol (MIRALAX / GLYCOLAX) packet Take 17 g by mouth daily as needed for moderate constipation.   . rosuvastatin (CRESTOR) 10 MG tablet Take 5 mg by mouth 3 (three) times a week.  . sennosides-docusate sodium (SENOKOT-S) 8.6-50 MG tablet  Take 1 tablet by mouth 3 (three) times daily as needed for constipation (when taking Norco).   . tamsulosin (FLOMAX) 0.4 MG CAPS capsule Take 0.4 mg by mouth daily with lunch.    No facility-administered encounter medications on file as of 01/26/2017.    Review of Systems Is positive for bilateral knee pain, gait difficulty,   PHYSICAL EXAMINATOINS:  Generalized: In no acute distress  Neck: Supple, no carotid bruits   Cardiac: Regular rate rhythm  Pulmonary: Clear to auscultation bilaterally  Musculoskeletal: No deformity  Neurological examination  Mentation: Alert oriented to time, place, history taking, and causual conversation, MMSE 29/30, he is not oriented to year  Cranial nerve II-XII: Pupils were equal round reactive to light extraocular movements were full, visual field were full on confrontational test.  Bilateral fundi were sharp  Facial sensation and strength were normal. hearing was intact to finger rubbing bilaterally. Uvula tongue midline.  head turning and shoulder shrug and were normal and symmetric.Tongue protrusion into cheek strength was normal.  Motor: normal tone, bulk and strength.  Sensory: Intact to fine touch   Coordination: Normal  finger to nose, heel-to-shin bilaterally there was no truncal ataxia  Gait: atalgic, cautious gait.  Deep tendon reflexes: Brachioradialis 1/1, biceps 1/1, triceps 1/1, patellar absent, Achilles absent, plantar responses were flexor bilaterally.  Assessment and Plan:  83 years old Caucasian male, with past medical history of left trigeminal neuralgia, doing very well, with his current dose of carbamazepine, 200 mg twice daily, he is also has multiple vascular risk factors,  I have refilled his carbamazepine 200 mg twice a day  Marcial Pacas, [name] Ph.D.  [facility] [place], [place] [address] Phone: [phone] Fax:      [phone]

## 2017-03-23 DIAGNOSIS — M25569 Pain in unspecified knee: Secondary | ICD-10-CM | POA: Diagnosis not present

## 2017-03-23 DIAGNOSIS — M25519 Pain in unspecified shoulder: Secondary | ICD-10-CM | POA: Diagnosis not present

## 2017-03-23 DIAGNOSIS — Z79891 Long term (current) use of opiate analgesic: Secondary | ICD-10-CM | POA: Diagnosis not present

## 2017-03-23 DIAGNOSIS — G894 Chronic pain syndrome: Secondary | ICD-10-CM | POA: Diagnosis not present

## 2017-03-23 DIAGNOSIS — M25559 Pain in unspecified hip: Secondary | ICD-10-CM | POA: Diagnosis not present

## 2017-03-23 DIAGNOSIS — Z79899 Other long term (current) drug therapy: Secondary | ICD-10-CM | POA: Diagnosis not present

## 2017-04-25 DIAGNOSIS — H903 Sensorineural hearing loss, bilateral: Secondary | ICD-10-CM | POA: Diagnosis not present

## 2017-05-16 ENCOUNTER — Telehealth: Payer: Self-pay | Admitting: Neurology

## 2017-05-16 DIAGNOSIS — G500 Trigeminal neuralgia: Secondary | ICD-10-CM

## 2017-05-16 MED ORDER — CARBAMAZEPINE 200 MG PO TABS
200.0000 mg | ORAL_TABLET | Freq: Two times a day (BID) | ORAL | 2 refills | Status: DC
Start: 2017-05-16 — End: 2018-02-03

## 2017-05-16 NOTE — Addendum Note (Signed)
Addended by: Belinda Block A on: 05/16/2017 04:33 PM   Modules accepted: Orders

## 2017-05-16 NOTE — Telephone Encounter (Signed)
I do not see that we received anything from [facility] but I have sent the refill for his Tegretol.

## 2017-05-16 NOTE — Telephone Encounter (Signed)
Alexis from [facility] has called re the refill request for carbamazepine (TEGRETOL) 200 MG tablet that was sent over on 02-25, she is asking for a call with the status

## 2017-05-18 DIAGNOSIS — M2550 Pain in unspecified joint: Secondary | ICD-10-CM | POA: Diagnosis not present

## 2017-05-18 DIAGNOSIS — M25559 Pain in unspecified hip: Secondary | ICD-10-CM | POA: Diagnosis not present

## 2017-05-18 DIAGNOSIS — M25519 Pain in unspecified shoulder: Secondary | ICD-10-CM | POA: Diagnosis not present

## 2017-05-18 DIAGNOSIS — G894 Chronic pain syndrome: Secondary | ICD-10-CM | POA: Diagnosis not present

## 2017-06-24 DIAGNOSIS — Z85038 Personal history of other malignant neoplasm of large intestine: Secondary | ICD-10-CM | POA: Diagnosis not present

## 2017-06-24 DIAGNOSIS — E7800 Pure hypercholesterolemia, unspecified: Secondary | ICD-10-CM | POA: Diagnosis not present

## 2017-06-24 DIAGNOSIS — G894 Chronic pain syndrome: Secondary | ICD-10-CM | POA: Diagnosis not present

## 2017-06-24 DIAGNOSIS — I10 Essential (primary) hypertension: Secondary | ICD-10-CM | POA: Diagnosis not present

## 2017-06-24 DIAGNOSIS — N183 Chronic kidney disease, stage 3 (moderate): Secondary | ICD-10-CM | POA: Diagnosis not present

## 2017-06-24 DIAGNOSIS — Z1389 Encounter for screening for other disorder: Secondary | ICD-10-CM | POA: Diagnosis not present

## 2017-06-24 DIAGNOSIS — Z23 Encounter for immunization: Secondary | ICD-10-CM | POA: Diagnosis not present

## 2017-06-24 DIAGNOSIS — Z0000 Encounter for general adult medical examination without abnormal findings: Secondary | ICD-10-CM | POA: Diagnosis not present

## 2017-06-29 ENCOUNTER — Other Ambulatory Visit: Payer: Self-pay | Admitting: Neurology

## 2017-06-29 ENCOUNTER — Other Ambulatory Visit: Payer: Self-pay | Admitting: Cardiology

## 2017-06-30 ENCOUNTER — Other Ambulatory Visit: Payer: Self-pay | Admitting: Cardiology

## 2017-06-30 MED ORDER — METOPROLOL TARTRATE 50 MG PO TABS: 50.0000 mg | ORAL_TABLET | Freq: Two times a day (BID) | ORAL | 0 refills | Status: DC

## 2017-06-30 NOTE — Telephone Encounter (Signed)
New message     *STAT* If patient is at the pharmacy, call can be transferred to refill team.   1. Which medications need to be refilled? (please list name of each medication and dose if knowmetoprolol tartrate (LOPRESSOR) 50 MG tabletn)   2. Which pharmacy/location (including street and city if local pharmacy) is medication to be sent to?[place], [place]  3. Do they need a 30 day or 90 day supply? [place]

## 2017-06-30 NOTE — Telephone Encounter (Signed)
Pt's medication was sent to pt's pharmacy as requested. Confirmation received.  °

## 2017-07-03 ENCOUNTER — Other Ambulatory Visit: Payer: Self-pay | Admitting: Cardiology

## 2017-07-13 DIAGNOSIS — Z79899 Other long term (current) drug therapy: Secondary | ICD-10-CM | POA: Diagnosis not present

## 2017-07-13 DIAGNOSIS — M2550 Pain in unspecified joint: Secondary | ICD-10-CM | POA: Diagnosis not present

## 2017-07-13 DIAGNOSIS — M25569 Pain in unspecified knee: Secondary | ICD-10-CM | POA: Diagnosis not present

## 2017-07-13 DIAGNOSIS — M25559 Pain in unspecified hip: Secondary | ICD-10-CM | POA: Diagnosis not present

## 2017-07-13 DIAGNOSIS — G894 Chronic pain syndrome: Secondary | ICD-10-CM | POA: Diagnosis not present

## 2017-07-13 DIAGNOSIS — Z79891 Long term (current) use of opiate analgesic: Secondary | ICD-10-CM | POA: Diagnosis not present

## 2017-08-24 ENCOUNTER — Encounter: Payer: Self-pay | Admitting: Cardiology

## 2017-08-24 ENCOUNTER — Ambulatory Visit (INDEPENDENT_AMBULATORY_CARE_PROVIDER_SITE_OTHER): Payer: Medicare Other | Admitting: Cardiology

## 2017-08-24 VITALS — BP 176/70 | HR 53 | Ht 67.0 in | Wt 167.1 lb

## 2017-08-24 DIAGNOSIS — I2510 Atherosclerotic heart disease of native coronary artery without angina pectoris: Secondary | ICD-10-CM | POA: Diagnosis not present

## 2017-08-24 DIAGNOSIS — I10 Essential (primary) hypertension: Secondary | ICD-10-CM

## 2017-08-24 DIAGNOSIS — I2583 Coronary atherosclerosis due to lipid rich plaque: Secondary | ICD-10-CM

## 2017-08-24 DIAGNOSIS — Z951 Presence of aortocoronary bypass graft: Secondary | ICD-10-CM

## 2017-08-24 DIAGNOSIS — Z953 Presence of xenogenic heart valve: Secondary | ICD-10-CM | POA: Diagnosis not present

## 2017-08-24 NOTE — Progress Notes (Signed)
Cardiology Office Note    Date:  2017-08-25   ID:  [name], DOB 1926/01/17, MRN [id number]  PCP:  Seward Carol, MD  Cardiologist:   Candee Furbish, MD   No chief complaint on file.   History of Present Illness:  KERON NEENAN is a 84 y.o. male for a patient of Dr. Sherryl Barters here with his wife today has a history of ischemic heart disease and valvular heart disease. He had coronary artery bypass graft surgery as well as aortic valve replacement using a tissue valve in 2001. He has done well postoperatively. He had a nuclear stress test in 2007 showing no evidence of ischemia. His ejection fraction was 59%. He had an echocardiogram in February 2010 showing normal LV systolic function with impaired relaxation and showed normal tissue aortic valve replacement with no aortic insufficiency. He has a history of high blood pressure and history of hypercholesterolemia. Since last visit he has been doing well except for chronic arthritis of both of his knees.he has been getting steroid shots in his knees at intervals by Dr. Jean Rosenthal.   His primary care provider is Dr. Delfina Redwood. For exercise he goes to the gym several times a week and uses an exercise bicycle.  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  He went on to have successful surgery with metal of the tumor by Dr. Michael Boston.  He has not been expressing any chest pain.  He still has exertional dyspnea.  He states that he is no longer taking oral iron.  All other medicines are the same he states.  However his daughter is in charge of his medicines.  Daughter, [name], has been helping them for several years. She used to run an agency that provided long-term care for people in home. [name] had knee replacement.   08/12/16 - left knee cap and elbow fracture. Struggling at times. Overall denies any chest pain, no syncope. He revisited the story about his  bypass and aortic valve replacement and his wife's I pass and aortic valve replacement only a few months after his.  Aug 25, 2017 - Wife died 2017/01/11. Dog died 37 years old.  Overall been feeling fairly well.  No significant chest pain fevers chills nausea vomiting syncope.  Saddened by the losses in his life.  He accepts the reality of this.  He was contemplating whether or not he should have his knee surgery done.  His physical therapist did not think it was a good idea, nor do I.   Past Medical History:  Diagnosis Date  . Arthritis    bil knees, right shoulder  . AVD (aortic valve disease)   . Chronic kidney disease    stage 3 kidney disease  . Complication of anesthesia    family states pt memory was bad 3-4 weeks after surgery  . Coronary atherosclerosis   . Fatigue   . Hyperlipidemia   . Hypertension   . Stroke [facility])    "I had a very milk sstroke one time the doctor said", current mini strokes  . Trigeminal neuralgia     Past Surgical History:  Procedure Laterality Date  . AORTIC VALVE REPLACEMENT     Carpentier-Edwards Pericardial Valve, MR Safe 1.5T  . CARDIAC CATHETERIZATION  06/22/1999   SEVERE HYPOKINESIA OF THE MID TO DISTAL INFERIOR WALL AND APEX.MODERATE LEFT VENTRICULAR DYSFUNCTION WITH EF 45%  . COLONOSCOPY  N/A 01/12/2015   Procedure: COLONOSCOPY;  Surgeon: Wilford Corner, MD;  Location[facility] ENDOSCOPY;  Service: Endoscopy;  Laterality: N/A;  . CORONARY ARTERY BYPASS GRAFT  2001  . ESOPHAGOGASTRODUODENOSCOPY N/A 01/10/2015   Procedure: ESOPHAGOGASTRODUODENOSCOPY (EGD);  Surgeon: Clarene Essex, MD;  Location: [facility] ENDOSCOPY;  Service: Endoscopy;  Laterality: N/A;    Current Medications: Outpatient Medications Prior to Visit  Medication Sig Dispense Refill  . amLODipine (NORVASC) 2.5 MG tablet Take 2.5 mg by mouth daily.    . Calcium Carbonate-Vitamin D (CALCIUM 600 + D PO) Take 1 tablet by mouth at bedtime.     . carbamazepine (TEGRETOL) 200 MG tablet Take 1 tablet (200  mg total) by mouth 2 (two) times daily. 180 tablet 2  . clopidogrel (PLAVIX) 75 MG tablet Take 1 tablet (75 mg total) by mouth daily. 90 tablet 4  . CRANBERRY PO Take 4,200 mg by mouth 2 (two) times daily.    Marland Kitchen donepezil (ARICEPT) 10 MG tablet TAKE 1 TABLET BY MOUTH AT BEDTIME 90 tablet 1  . HYDROcodone-acetaminophen (NORCO) 7.5-325 MG tablet Take 1 tablet by mouth every 8 (eight) hours as needed for moderate pain.    Marland Kitchen losartan-hydrochlorothiazide (HYZAAR) 100-25 MG tablet Take 1 tablet by mouth daily. Please keep upcoming appointment for additional refills thanks. 90 tablet 0  . metoprolol tartrate (LOPRESSOR) 50 MG tablet Take 1 tablet (50 mg total) by mouth 2 (two) times daily. Please keep upcoming appt for future refills. Thank you 180 tablet 0  . Misc Natural Products (OSTEO BI-FLEX ADV DOUBLE ST PO) Take 1 tablet by mouth 2 (two) times daily.    . Multiple Vitamins-Minerals (CENTRUM SILVER 50+MEN PO) Take 1 tablet by mouth daily.    . nitroGLYCERIN (NITROSTAT) 0.4 MG SL tablet Place 1 tablet (0.4 mg total) under the tongue every 5 (five) minutes as needed for chest pain. 25 tablet 3  . polyethylene glycol (MIRALAX / GLYCOLAX) packet Take 17 g by mouth daily as needed for moderate constipation.     . rosuvastatin (CRESTOR) 10 MG tablet Take 5 mg by mouth 3 (three) times a week.    . sennosides-docusate sodium (SENOKOT-S) 8.6-50 MG tablet Take 1 tablet by mouth 3 (three) times daily as needed for constipation (when taking Norco).     . tamsulosin (FLOMAX) 0.4 MG CAPS capsule Take 0.4 mg by mouth daily with lunch.   11  . amLODipine (NORVASC) 2.5 MG tablet Take 1 tablet (2.5 mg total) by mouth daily. Please make appt with Dr. Marlou Porch before anymore refills. Thank you (Patient not taking: Reported on 08/24/2017) 90 tablet 0   No facility-administered medications prior to visit.      Allergies:   Gabapentin; Lipitor [atorvastatin calcium]; and Niaspan [niacin]   Social History   Socioeconomic  History  . Marital status: Married    Spouse name: Murray Hodgkins  . Number of children: 4  . Years of education: 28  . Highest education level: Not on file  Occupational History    Employer: RETIRED    Comment: retired  Scientific laboratory technician  . Financial resource strain: Not on file  . Food insecurity:    Worry: Not on file    Inability: Not on file  . Transportation needs:    Medical: Not on file    Non-medical: Not on file  Tobacco Use  . Smoking status: Former Smoker    Last attempt to quit: 09/13/1985    Years since quitting: 31.9  . Smokeless tobacco: Never Used  Substance and Sexual Activity  . Alcohol use: No  . Drug use: No  . Sexual activity: Not on file  Lifestyle  . Physical activity:    Days per week: Not on file    Minutes per session: Not on file  . Stress: Not on file  Relationships  . Social connections:    Talks on phone: Not on file    Gets together: Not on file    Attends religious service: Not on file    Active member of club or organization: Not on file    Attends meetings of clubs or organizations: Not on file    Relationship status: Not on file  Other Topics Concern  . Not on file  Social History Narrative   Pt lives at home with his spouse. [name]) [name]   Caffeine Use: - Two cups of coffee daily and one pepsi daily.   Right handed.   Education- High school.     Family History:  The patient's family history includes Aneurysm in his father and mother; Heart disease in his brother; Stroke in his mother.   ROS:   Please see the history of present illness.    Review of Systems  All other systems reviewed and are negative.    PHYSICAL EXAM:   VS:  BP (!) 176/70   Pulse (!) 53   Ht 5\' 7"  (1.702 m)   Wt 167 lb 1.9 oz (75.8 kg)   SpO2 98%   BMI 26.17 kg/m    GEN: Well nourished, well developed, in no acute distress  HEENT: normal  Neck: no JVD, carotid bruits, or masses Cardiac: RRR; soft systolic murmurs, rubs, or gallops, left leg edema  Respiratory:   clear to auscultation bilaterally, normal work of breathing GI: soft, nontender, nondistended, + BS MS: no deformity or atrophy  Skin: warm and dry, no rash Neuro:  Alert and Oriented x 3, Strength and sensation are intact Psych: euthymic mood, full affect   Wt Readings from Last 3 Encounters:  08/24/17 167 lb 1.9 oz (75.8 kg)  01/26/17 166 lb (75.3 kg)  08/12/16 146 lb 12.8 oz (66.6 kg)      Studies/Labs Reviewed:   EKG: Today 08/24/2017-sinus bradycardia first-degree AV block 234 ms LVH PVC 08/12/16-sinus rhythm PVCs interventricular conduction delay, LVH, left bundle branch block like. Personally viewed  Recent Labs: No results found for requested labs within last 8760 hours.   Lipid Panel    Component Value Date/Time   CHOL 159 05/22/2012 1031   TRIG 131.0 05/22/2012 1031   HDL 43.80 05/22/2012 1031   CHOLHDL 4 05/22/2012 1031   VLDL 26.2 05/22/2012 1031   LDLCALC 89 05/22/2012 1031    Additional studies/ records that were reviewed today include:  Prior lab work, EKGs, office notes reviewed  ECHO 2016: - Left ventricle: The cavity size was normal. There was moderate   concentric hypertrophy. Systolic function was normal. The   estimated ejection fraction was in the range of 55% to 60%. Wall   motion was normal; there were no regional wall motion   abnormalities. Grade 2 diastolic dysfunction - elevated LV   filling pressure. - Aortic valve: Bioprosthetic AVR- no obstruction. There was mild   regurgitation. Mean gradient (S): 23 mm Hg. Peak gradient (S): 41   mm Hg. - Mitral valve: Calcified annulus. Moderately thickened and   calcified leaflets . There was moderate regurgitation. - Left atrium: Severely dilated at 61 ml/m2. - Atrial septum: Atrial septal aneurysm  with PFO and left to right   flow by color doppler. - Tricuspid valve: There was mild regurgitation. - Pulmonary arteries: PA peak pressure: 33 mm Hg (S). - Inferior vena cava: The vessel was normal in  size. The   respirophasic diameter changes were in the normal range (>= 50%),   consistent with normal central venous pressure.  Impressions:  - LVEF 55-60%, moderate LVH, normal wall motion, Grade 2 diastolic   dysfunction, bioprosthetic AVR without obstruction, severe LAE,   moderate MR with heavily calcified mitral annulus and leaflets,   atrial septal aneurysm with PFO and left to right shunting, mild   TR, RVSP 33 mmHg.   ASSESSMENT:    1. Hx of CABG   2. Essential hypertension   3. S/P aortic valve replacement with bioprosthetic valve   4. Coronary artery disease due to lipid rich plaque      PLAN:  In order of problems listed above:  Ischemic heart disease status post CABG in 2001  - No anginal symptoms, stable, no CP.  Doing very well.  History of aortic valve replacement, tissue valve 2001  - Interestingly his wife had aortic valve replacement same year.  - EF 60%, moderate LVH, normal bioprosthetic valve. Moderate MR. Doing well.  I will forego echocardiogram.  2016 last check.  Essential hypertension without heart failure  -A bit elevated today.  Continue to monitor.  At last check it was 130/70.  Previous history of severe anemia  - In October 2016, hemoglobin 6.4, cecal mass removed by Dr. Michael Boston. No postoperative chemotherapy was indicated. Been stable. No melena.  Overall has been stable, hemoglobin 12.6.  Hyperlipidemia  - Followed by Dr. Delfina Redwood, doing well.  Last LDL 114.  Knee osteoarthritis  - Doing well. No surgery.  We discussed again. Uses cane.  I do not think he would be a good surgical candidate.  Medication Adjustments/Labs and Tests Ordered: Current medicines are reviewed at length with the patient today.  Concerns regarding medicines are outlined above.  Medication changes, Labs and Tests ordered today are listed in the Patient Instructions below. Patient Instructions  Medication Instructions:  The current medical regimen is  effective;  continue present plan and medications.  Follow-Up: Follow up in 6 months with Dr. Marlou Porch.  You will receive a letter in the mail 2 months before you are due.  Please call us when you receive this letter to schedule your follow up appointment.  If you need a refill on your cardiac medications before your next appointment, please call your pharmacy.  Thank you for choosing [facility]!!        Signed, Candee Furbish, MD  08/24/2017 4:36 PM    [facility] [place], [place], [place]  [address] Phone: [phone]; Fax: [phone]

## 2017-08-24 NOTE — Patient Instructions (Addendum)

## 2017-09-08 ENCOUNTER — Other Ambulatory Visit: Payer: Self-pay | Admitting: Neurology

## 2017-09-13 DIAGNOSIS — M25569 Pain in unspecified knee: Secondary | ICD-10-CM | POA: Diagnosis not present

## 2017-09-13 DIAGNOSIS — G894 Chronic pain syndrome: Secondary | ICD-10-CM | POA: Diagnosis not present

## 2017-09-13 DIAGNOSIS — M2550 Pain in unspecified joint: Secondary | ICD-10-CM | POA: Diagnosis not present

## 2017-09-14 ENCOUNTER — Other Ambulatory Visit: Payer: Self-pay | Admitting: Neurology

## 2017-09-19 ENCOUNTER — Other Ambulatory Visit: Payer: Self-pay | Admitting: Cardiology

## 2017-09-23 ENCOUNTER — Other Ambulatory Visit: Payer: Self-pay | Admitting: Cardiology

## 2017-10-10 ENCOUNTER — Other Ambulatory Visit: Payer: Self-pay | Admitting: Cardiology

## 2017-10-18 ENCOUNTER — Other Ambulatory Visit: Payer: Self-pay | Admitting: Cardiology

## 2017-10-18 NOTE — Telephone Encounter (Signed)
Outpatient Medication Detail    Disp Refills Start End   metoprolol tartrate (LOPRESSOR) 50 MG tablet 180 tablet 3 10/10/2017    Sig - Route: Take 1 tablet (50 mg total) by mouth 2 (two) times daily. - Oral   Sent to pharmacy as: metoprolol tartrate (LOPRESSOR) 50 MG tablet   E-Prescribing Status: Receipt confirmed by pharmacy (10/10/2017 2:32 PM EDT)   Pharmacy   [facility] [place], [place]

## 2017-11-15 DIAGNOSIS — M1990 Unspecified osteoarthritis, unspecified site: Secondary | ICD-10-CM | POA: Diagnosis not present

## 2017-11-15 DIAGNOSIS — G894 Chronic pain syndrome: Secondary | ICD-10-CM | POA: Diagnosis not present

## 2017-11-15 DIAGNOSIS — Z79899 Other long term (current) drug therapy: Secondary | ICD-10-CM | POA: Diagnosis not present

## 2017-11-15 DIAGNOSIS — M2550 Pain in unspecified joint: Secondary | ICD-10-CM | POA: Diagnosis not present

## 2017-11-15 DIAGNOSIS — Z79891 Long term (current) use of opiate analgesic: Secondary | ICD-10-CM | POA: Diagnosis not present

## 2017-11-22 DIAGNOSIS — M21611 Bunion of right foot: Secondary | ICD-10-CM | POA: Diagnosis not present

## 2017-11-22 DIAGNOSIS — L602 Onychogryphosis: Secondary | ICD-10-CM | POA: Diagnosis not present

## 2017-11-22 DIAGNOSIS — M21612 Bunion of left foot: Secondary | ICD-10-CM | POA: Diagnosis not present

## 2017-12-27 ENCOUNTER — Other Ambulatory Visit: Payer: Self-pay | Admitting: Internal Medicine

## 2017-12-27 ENCOUNTER — Ambulatory Visit
Admission: RE | Admit: 2017-12-27 | Discharge: 2017-12-27 | Disposition: A | Discharge status: Home / Self Care | Payer: Medicare Other | Source: Ambulatory Visit | Attending: Internal Medicine | Admitting: Internal Medicine

## 2017-12-27 DIAGNOSIS — N183 Chronic kidney disease, stage 3 unspecified: Secondary | ICD-10-CM

## 2017-12-27 DIAGNOSIS — R7989 Other specified abnormal findings of blood chemistry: Secondary | ICD-10-CM | POA: Diagnosis not present

## 2017-12-27 DIAGNOSIS — Z85038 Personal history of other malignant neoplasm of large intestine: Secondary | ICD-10-CM | POA: Diagnosis not present

## 2017-12-27 DIAGNOSIS — G894 Chronic pain syndrome: Secondary | ICD-10-CM | POA: Diagnosis not present

## 2017-12-27 DIAGNOSIS — R413 Other amnesia: Secondary | ICD-10-CM | POA: Diagnosis not present

## 2017-12-27 DIAGNOSIS — M25522 Pain in left elbow: Secondary | ICD-10-CM | POA: Diagnosis not present

## 2017-12-27 DIAGNOSIS — R52 Pain, unspecified: Secondary | ICD-10-CM

## 2017-12-27 DIAGNOSIS — R0789 Other chest pain: Secondary | ICD-10-CM | POA: Diagnosis not present

## 2017-12-27 DIAGNOSIS — S2232XA Fracture of one rib, left side, initial encounter for closed fracture: Secondary | ICD-10-CM | POA: Diagnosis not present

## 2017-12-27 DIAGNOSIS — M170 Bilateral primary osteoarthritis of knee: Secondary | ICD-10-CM | POA: Diagnosis not present

## 2017-12-27 DIAGNOSIS — I10 Essential (primary) hypertension: Secondary | ICD-10-CM | POA: Diagnosis not present

## 2017-12-27 DIAGNOSIS — E7800 Pure hypercholesterolemia, unspecified: Secondary | ICD-10-CM | POA: Diagnosis not present

## 2017-12-27 DIAGNOSIS — S59902A Unspecified injury of left elbow, initial encounter: Secondary | ICD-10-CM | POA: Diagnosis not present

## 2017-12-27 DIAGNOSIS — Z23 Encounter for immunization: Secondary | ICD-10-CM | POA: Diagnosis not present

## 2017-12-27 DIAGNOSIS — S50312A Abrasion of left elbow, initial encounter: Secondary | ICD-10-CM | POA: Diagnosis not present

## 2018-01-02 DIAGNOSIS — R7989 Other specified abnormal findings of blood chemistry: Secondary | ICD-10-CM | POA: Diagnosis not present

## 2018-01-02 DIAGNOSIS — E748 Other specified disorders of carbohydrate metabolism: Secondary | ICD-10-CM | POA: Diagnosis not present

## 2018-01-03 ENCOUNTER — Other Ambulatory Visit: Payer: Self-pay | Admitting: Internal Medicine

## 2018-01-03 DIAGNOSIS — N179 Acute kidney failure, unspecified: Secondary | ICD-10-CM | POA: Diagnosis not present

## 2018-01-03 DIAGNOSIS — D649 Anemia, unspecified: Secondary | ICD-10-CM | POA: Diagnosis not present

## 2018-01-04 ENCOUNTER — Ambulatory Visit
Admission: RE | Admit: 2018-01-04 | Discharge: 2018-01-04 | Disposition: A | Discharge status: Home / Self Care | Payer: Medicare Other | Source: Ambulatory Visit | Attending: Internal Medicine | Admitting: Internal Medicine

## 2018-01-04 DIAGNOSIS — N179 Acute kidney failure, unspecified: Secondary | ICD-10-CM

## 2018-01-10 DIAGNOSIS — Z79899 Other long term (current) drug therapy: Secondary | ICD-10-CM | POA: Diagnosis not present

## 2018-01-10 DIAGNOSIS — M25569 Pain in unspecified knee: Secondary | ICD-10-CM | POA: Diagnosis not present

## 2018-01-10 DIAGNOSIS — Z79891 Long term (current) use of opiate analgesic: Secondary | ICD-10-CM | POA: Diagnosis not present

## 2018-01-10 DIAGNOSIS — M25519 Pain in unspecified shoulder: Secondary | ICD-10-CM | POA: Diagnosis not present

## 2018-01-10 DIAGNOSIS — G894 Chronic pain syndrome: Secondary | ICD-10-CM | POA: Diagnosis not present

## 2018-01-10 DIAGNOSIS — M25559 Pain in unspecified hip: Secondary | ICD-10-CM | POA: Diagnosis not present

## 2018-01-17 DIAGNOSIS — R93429 Abnormal radiologic findings on diagnostic imaging of unspecified kidney: Secondary | ICD-10-CM | POA: Diagnosis not present

## 2018-01-17 DIAGNOSIS — N183 Chronic kidney disease, stage 3 (moderate): Secondary | ICD-10-CM | POA: Diagnosis not present

## 2018-01-17 DIAGNOSIS — D649 Anemia, unspecified: Secondary | ICD-10-CM | POA: Diagnosis not present

## 2018-01-17 DIAGNOSIS — N179 Acute kidney failure, unspecified: Secondary | ICD-10-CM | POA: Diagnosis not present

## 2018-01-30 ENCOUNTER — Ambulatory Visit: Payer: Medicare Other | Admitting: Neurology

## 2018-01-30 ENCOUNTER — Telehealth: Payer: Self-pay | Admitting: *Deleted

## 2018-01-30 ENCOUNTER — Encounter: Payer: Self-pay | Admitting: Neurology

## 2018-01-30 NOTE — Telephone Encounter (Signed)
Canceled follow up same day due to sickness.

## 2018-02-03 ENCOUNTER — Other Ambulatory Visit: Payer: Self-pay | Admitting: Geriatric Medicine

## 2018-02-03 ENCOUNTER — Other Ambulatory Visit: Payer: Self-pay | Admitting: Neurology

## 2018-02-03 ENCOUNTER — Ambulatory Visit
Admission: RE | Admit: 2018-02-03 | Discharge: 2018-02-03 | Disposition: A | Discharge status: Home / Self Care | Payer: Medicare Other | Source: Ambulatory Visit | Attending: Geriatric Medicine | Admitting: Geriatric Medicine

## 2018-02-03 DIAGNOSIS — R05 Cough: Secondary | ICD-10-CM

## 2018-02-03 DIAGNOSIS — R059 Cough, unspecified: Secondary | ICD-10-CM

## 2018-02-03 DIAGNOSIS — G500 Trigeminal neuralgia: Secondary | ICD-10-CM

## 2018-02-03 DIAGNOSIS — Z79899 Other long term (current) drug therapy: Secondary | ICD-10-CM | POA: Diagnosis not present

## 2018-02-03 DIAGNOSIS — N183 Chronic kidney disease, stage 3 (moderate): Secondary | ICD-10-CM | POA: Diagnosis not present

## 2018-02-03 DIAGNOSIS — R600 Localized edema: Secondary | ICD-10-CM | POA: Diagnosis not present

## 2018-02-03 DIAGNOSIS — R0601 Orthopnea: Secondary | ICD-10-CM | POA: Diagnosis not present

## 2018-02-03 DIAGNOSIS — J90 Pleural effusion, not elsewhere classified: Secondary | ICD-10-CM | POA: Diagnosis not present

## 2018-02-03 DIAGNOSIS — I129 Hypertensive chronic kidney disease with stage 1 through stage 4 chronic kidney disease, or unspecified chronic kidney disease: Secondary | ICD-10-CM | POA: Diagnosis not present

## 2018-02-06 ENCOUNTER — Other Ambulatory Visit: Payer: Self-pay | Admitting: Neurology

## 2018-02-10 ENCOUNTER — Other Ambulatory Visit: Payer: Self-pay | Admitting: Neurology

## 2018-02-21 DIAGNOSIS — I509 Heart failure, unspecified: Secondary | ICD-10-CM | POA: Diagnosis not present

## 2018-02-21 DIAGNOSIS — R634 Abnormal weight loss: Secondary | ICD-10-CM | POA: Diagnosis not present

## 2018-02-21 DIAGNOSIS — N184 Chronic kidney disease, stage 4 (severe): Secondary | ICD-10-CM | POA: Diagnosis not present

## 2018-02-23 ENCOUNTER — Other Ambulatory Visit (HOSPITAL_COMMUNITY): Payer: Self-pay | Admitting: Internal Medicine

## 2018-02-23 DIAGNOSIS — I509 Heart failure, unspecified: Secondary | ICD-10-CM

## 2018-02-27 ENCOUNTER — Ambulatory Visit (HOSPITAL_COMMUNITY): Payer: Medicare Other | Attending: Cardiology

## 2018-02-27 ENCOUNTER — Other Ambulatory Visit: Payer: Self-pay

## 2018-02-27 DIAGNOSIS — I509 Heart failure, unspecified: Secondary | ICD-10-CM | POA: Insufficient documentation

## 2018-02-27 MED ORDER — PERFLUTREN LIPID MICROSPHERE
1.0000 mL | INTRAVENOUS | Status: AC | PRN
  Administered 2018-02-27: 2 mL via INTRAVENOUS

## 2018-02-28 ENCOUNTER — Other Ambulatory Visit: Payer: Self-pay | Admitting: Internal Medicine

## 2018-02-28 ENCOUNTER — Ambulatory Visit
Admission: RE | Admit: 2018-02-28 | Discharge: 2018-02-28 | Disposition: A | Discharge status: Home / Self Care | Payer: Medicare Other | Source: Ambulatory Visit | Attending: Internal Medicine | Admitting: Internal Medicine

## 2018-02-28 DIAGNOSIS — S0990XA Unspecified injury of head, initial encounter: Secondary | ICD-10-CM

## 2018-02-28 DIAGNOSIS — I509 Heart failure, unspecified: Secondary | ICD-10-CM | POA: Diagnosis not present

## 2018-02-28 DIAGNOSIS — S4992XA Unspecified injury of left shoulder and upper arm, initial encounter: Secondary | ICD-10-CM | POA: Diagnosis not present

## 2018-02-28 DIAGNOSIS — M25512 Pain in left shoulder: Secondary | ICD-10-CM | POA: Diagnosis not present

## 2018-02-28 DIAGNOSIS — I5021 Acute systolic (congestive) heart failure: Secondary | ICD-10-CM | POA: Diagnosis not present

## 2018-02-28 DIAGNOSIS — M25522 Pain in left elbow: Secondary | ICD-10-CM

## 2018-02-28 DIAGNOSIS — W19XXXA Unspecified fall, initial encounter: Secondary | ICD-10-CM | POA: Diagnosis not present

## 2018-02-28 DIAGNOSIS — N183 Chronic kidney disease, stage 3 (moderate): Secondary | ICD-10-CM | POA: Diagnosis not present

## 2018-03-01 ENCOUNTER — Telehealth: Payer: Self-pay | Admitting: Cardiology

## 2018-03-01 NOTE — Telephone Encounter (Signed)
New Message:   Patient daughter calling to get a sooner appt. The patient is not feeling well at all. patient had a ECHO this 02/27/18 caregiver states that his Dr. Did not want him to be alone because his heart is working on 15 percent. Please call patient caregiver pam his daughter at [phone].

## 2018-03-01 NOTE — Telephone Encounter (Signed)
Scheduled patient for evaluation with Dr. Marlou Porch this Friday at 1500. DPR was grateful for call and agrees with treatment plan.

## 2018-03-01 NOTE — Telephone Encounter (Signed)
Thank you for bringing this to my attention.  I will be happy to see him on Friday if I have a spot available. Candee Furbish, MD

## 2018-03-03 ENCOUNTER — Other Ambulatory Visit: Payer: Self-pay | Admitting: Urology

## 2018-03-03 ENCOUNTER — Encounter: Payer: Self-pay | Admitting: Cardiology

## 2018-03-03 ENCOUNTER — Ambulatory Visit (INDEPENDENT_AMBULATORY_CARE_PROVIDER_SITE_OTHER): Payer: Medicare Other | Admitting: Cardiology

## 2018-03-03 VITALS — BP 120/80 | HR 88 | Ht 67.0 in | Wt 158.0 lb

## 2018-03-03 DIAGNOSIS — Z953 Presence of xenogenic heart valve: Secondary | ICD-10-CM

## 2018-03-03 DIAGNOSIS — I255 Ischemic cardiomyopathy: Secondary | ICD-10-CM

## 2018-03-03 DIAGNOSIS — I2583 Coronary atherosclerosis due to lipid rich plaque: Secondary | ICD-10-CM | POA: Diagnosis not present

## 2018-03-03 DIAGNOSIS — I2510 Atherosclerotic heart disease of native coronary artery without angina pectoris: Secondary | ICD-10-CM

## 2018-03-03 DIAGNOSIS — D49511 Neoplasm of unspecified behavior of right kidney: Secondary | ICD-10-CM

## 2018-03-03 DIAGNOSIS — I5021 Acute systolic (congestive) heart failure: Secondary | ICD-10-CM | POA: Diagnosis not present

## 2018-03-03 DIAGNOSIS — Z951 Presence of aortocoronary bypass graft: Secondary | ICD-10-CM

## 2018-03-03 DIAGNOSIS — D4101 Neoplasm of uncertain behavior of right kidney: Secondary | ICD-10-CM

## 2018-03-03 DIAGNOSIS — R310 Gross hematuria: Secondary | ICD-10-CM | POA: Diagnosis not present

## 2018-03-03 DIAGNOSIS — I10 Essential (primary) hypertension: Secondary | ICD-10-CM

## 2018-03-03 NOTE — Progress Notes (Signed)
Cardiology Office Note:    Date:  03/05/2018   ID:  Michael Blanchard, DOB Jun 13, 1925, MRN [id number]  PCP:  Seward Carol, MD  Cardiologist:  Candee Furbish, MD  Electrophysiologist:  None   Referring MD: Seward Carol, MD     History of Present Illness:    Michael Blanchard is a 84 y.o. male here for follow-up of echocardiogram that was recently performed that showed a marked reduced ejection fraction.  Former patient of Dr. Sherryl Barters.  Had coronary artery bypass surgery and aortic valve replacement in 08-Jul-1999.  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  Dr. Johney Maine.  His daughter Michael Blanchard had been helping him for several years.  She ran an agency that provided long-term care for people in home.  In Jul 07, 2016 he had left kneecap and elbow fracture.  Both he and his wife who died in 07/07/16 had bypass surgery months apart.  He comes in today with Michael Blanchard, sitting in a wheelchair, mild increased work of breathing at rest but noticeable increased work of breathing with minimal exertion, NYHA class III-IV symptoms.  He notes that he has been sleeping in his recliner.  Difficult for him to lay flat secondary to orthopnea.  His lower extremities have been swelling more than usual.  Clearly over the last several weeks he has demonstrated a market decline.  Also has lack of appetite, weight loss, symptoms of what appear to be cardiac cachexia.  Past Medical History:  Diagnosis Date  . Arthritis    bil knees, right shoulder  . AVD (aortic valve disease)   . Chronic kidney disease    stage 3 kidney disease  . Complication of anesthesia    family states pt memory was bad 3-4 weeks after surgery  . Coronary atherosclerosis   . Fatigue   . Hyperlipidemia   . Hypertension   . Stroke [facility])    "I had a very milk sstroke one time the doctor said", current mini strokes  . Trigeminal neuralgia     Past Surgical History:  Procedure Laterality Date  . AORTIC VALVE REPLACEMENT       [place]-Edwards Pericardial Valve, MR Safe 1.5T  . CARDIAC CATHETERIZATION  06/22/1999   SEVERE HYPOKINESIA OF THE MID TO DISTAL INFERIOR WALL AND APEX.MODERATE LEFT VENTRICULAR DYSFUNCTION WITH EF 45%  . COLONOSCOPY N/A 01/12/2015   Procedure: COLONOSCOPY;  Surgeon: Wilford Corner, MD;  Location: [facility] ENDOSCOPY;  Service: Endoscopy;  Laterality: N/A;  . CORONARY ARTERY BYPASS GRAFT  07-08-1999  . ESOPHAGOGASTRODUODENOSCOPY N/A 01/10/2015   Procedure: ESOPHAGOGASTRODUODENOSCOPY (EGD);  Surgeon: Clarene Essex, MD;  Location: [facility] ENDOSCOPY;  Service: Endoscopy;  Laterality: N/A;    Current Medications: Current Meds  Medication Sig  . Calcium Carbonate-Vitamin D (CALCIUM 600 + D PO) Take 1 tablet by mouth at bedtime.   . carbamazepine (TEGRETOL) 200 MG tablet TAKE 1 TABLET BY MOUTH TWICE DAILY  . carvedilol (COREG) 6.25 MG tablet Take 6.25 mg by mouth 2 (two) times daily.  . clopidogrel (PLAVIX) 75 MG tablet TAKE 1 TABLET BY MOUTH DAILY. GENERIC EQUIVALENT FOR PLAVIX  . CRANBERRY PO Take 4,200 mg by mouth 2 (two) times daily.  Marland Kitchen donepezil (ARICEPT) 10 MG tablet TAKE 1 TABLET BY MOUTH AT BEDTIME  . furosemide (LASIX) 20 MG tablet Take 20 mg by mouth daily. Or as directed for weight gain  . HYDROcodone-acetaminophen (NORCO) 7.5-325 MG tablet Take 1 tablet by mouth every 8 (eight) hours as  needed for moderate pain.  . Misc Natural Products (OSTEO BI-FLEX ADV DOUBLE ST PO) Take 1 tablet by mouth 2 (two) times daily.  . Multiple Vitamins-Minerals (CENTRUM SILVER 50+MEN PO) Take 1 tablet by mouth daily.  . nitroGLYCERIN (NITROSTAT) 0.4 MG SL tablet Place 1 tablet (0.4 mg total) under the tongue every 5 (five) minutes as needed for chest pain.  . polyethylene glycol (MIRALAX / GLYCOLAX) packet Take 17 g by mouth daily as needed for moderate constipation.   . sennosides-docusate sodium (SENOKOT-S) 8.6-50 MG tablet Take 1 tablet by mouth 3 (three) times daily as needed for constipation (when taking Norco).    . tamsulosin (FLOMAX) 0.4 MG CAPS capsule Take 0.4 mg by mouth daily with lunch.      Allergies:   Gabapentin; Lipitor [atorvastatin calcium]; and Niaspan [niacin]   Social History   Socioeconomic History  . Marital status: Married    Spouse name: Murray Hodgkins  . Number of children: 4  . Years of education: 82  . Highest education level: Not on file  Occupational History    Employer: RETIRED    Comment: retired  Scientific laboratory technician  . Financial resource strain: Not on file  . Food insecurity:    Worry: Not on file    Inability: Not on file  . Transportation needs:    Medical: Not on file    Non-medical: Not on file  Tobacco Use  . Smoking status: Former Smoker    Last attempt to quit: 09/13/1985    Years since quitting: 32.4  . Smokeless tobacco: Never Used  Substance and Sexual Activity  . Alcohol use: No  . Drug use: No  . Sexual activity: Not on file  Lifestyle  . Physical activity:    Days per week: Not on file    Minutes per session: Not on file  . Stress: Not on file  Relationships  . Social connections:    Talks on phone: Not on file    Gets together: Not on file    Attends religious service: Not on file    Active member of club or organization: Not on file    Attends meetings of clubs or organizations: Not on file    Relationship status: Not on file  Other Topics Concern  . Not on file  Social History Narrative   Pt lives at home with his spouse. [name]) [name]   Caffeine Use: - Two cups of coffee daily and one pepsi daily.   Right handed.   Education- High school.     Family History: The patient's family history includes Aneurysm in his father and mother; Heart disease in his brother; Stroke in his mother.  ROS:   Please see the history of present illness.    Positive for weakness, lack of appetite, weight loss, occasional nausea, significant shortness of breath with activity, denies chest pain, no syncope.  All other systems reviewed and are  negative.  EKGs/Labs/Other Studies Reviewed:    The following studies were reviewed today: ECHO 02/27/18: - Left ventricle: The cavity size was mildly dilated. There was   mild concentric hypertrophy. Systolic function was normal. The   estimated ejection fraction was in the range of 10% to 15%. Wall   motion was normal; there were no regional wall motion   abnormalities. Features are consistent with a pseudonormal left   ventricular filling pattern, with concomitant abnormal relaxation   and increased filling pressure (grade 2 diastolic dysfunction).   Doppler parameters are consistent with  elevated ventricular   end-diastolic filling pressure. - Aortic valve: S/P AVR with a bioprosthesis with severely   thickened, severely calcified leaflets. There was mild stenosis.   Mean gradient (S): 13 mm Hg. Peak gradient (S): 27 mm Hg. - Mitral valve: The findings are consistent with mild stenosis.   There was moderate regurgitation. - Left atrium: The atrium was mildly dilated. - Right ventricle: The cavity size was mildly dilated. Wall   thickness was normal. Systolic function was moderately reduced. - Right atrium: The atrium was mildly dilated. - Inferior vena cava: The vessel was dilated. The respirophasic   diameter changes were blunted (< 50%), consistent with elevated   central venous pressure.  Impressions:  - Since the prior study on 10/25/2016there has been a significant   change, LVEF has decreased from 55-60% to 10-15%. Transaortic   gradients across the bioprosthetic valve are mildly elevated,   however most probably represent low flow/low gradient severe   aortic stenosis. RVEF is moderately decreased.  Prior mean gradient across the valve was 23 mmHg  EKG:  EKG is not ordered today.  Previous ECG on 08/24/2017 showed sinus bradycardia rate 53 with interventricular conduction delay left bundle branch blocklike appearance, nonspecific ST-T wave changes.  Personally  reviewed.  Recent Labs: No results found for requested labs within last 8760 hours.  Recent Lipid Panel    Component Value Date/Time   CHOL 159 05/22/2012 1031   TRIG 131.0 05/22/2012 1031   HDL 43.80 05/22/2012 1031   CHOLHDL 4 05/22/2012 1031   VLDL 26.2 05/22/2012 1031   LDLCALC 89 05/22/2012 1031    Physical Exam:    VS:  BP 120/80   Pulse 88   Ht 5\' 7"  (1.702 m)   Wt 158 lb (71.7 kg)   SpO2 97%   BMI 24.75 kg/m     Wt Readings from Last 3 Encounters:  03/03/18 158 lb (71.7 kg)  08/24/17 167 lb 1.9 oz (75.8 kg)  01/26/17 166 lb (75.3 kg)     GEN: Elderly, sitting in wheelchair, mildly increased work of breathing in no acute distress HEENT: Normal NECK: Mid neck JVD; No carotid bruits LYMPHATICS: No lymphadenopathy CARDIAC: RRR, 2/6 systolic right upper sternal border murmur, I do hear closure of aortic valve, no rubs, gallops RESPIRATORY: Mildly decreased breath sounds at bases, no wheezes or rhonchi  ABDOMEN: Soft, non-tender, non-distended MUSCULOSKELETAL: 1-2+ bilateral lower extremity edema; No deformity  SKIN: Warm and dry NEUROLOGIC:  Alert and oriented x 3 PSYCHIATRIC:  Normal affect   ASSESSMENT:    1. Acute systolic heart failure ([place])   2. Coronary artery disease due to lipid rich plaque   3. S/P aortic valve replacement with bioprosthetic valve   4. Hx of CABG   5. Essential hypertension   6. Ischemic cardiomyopathy    PLAN:    In order of problems listed above:  Markedly reduced ejection fraction/left ventricular function - Echocardiogram shows EF of 15% down from 55%.  Bioprosthetic aortic valve is thickened, gradient likely under estimated, but prior gradient was 23 mm mean.  Ischemic cardiomyopathy.  Concerned about this decrease.  This markedly increases his risk for cardiovascular morbidity mortality.  -He is currently demonstrating orthopnea, stage D symptoms, class IV.  He is sitting up in a recliner.  Michael Blanchard his caregiver daughter lives  in [place] is helping him. - We had frank discussion-DNR discussion and I was able to fill out his DNR form for him discussing that if he  were to develop a fatal cardiac arrhythmia, given his overall underlying condition, meaningful existence following CPR, ventilator management would be markedly reduced.  End-stage heart failure type symptoms. - In looking at his aortic valve, it is still opening on echocardiogram.  I still hear in A2 .  I think that his cardiomyopathy is not secondary to severe stenosis of his bioprosthetic valve.  There is still what appears to be adequate valve opening/movement on echocardiogram as noted above.  Given his nausea, weakness, lethargy, recent falls, weight loss, I do not think that he would be an invasive candidate at this point.  Discussed this frankly with he and his daughter.  He is also working up at this time a renal mass.  He had some transient hematuria. - In discussion with Michael Blanchard, he is worried about needing to go to a nursing home.  He states that he wants to die at home.  History of aortic valve replacement 2001 - Appears severely thickened and calcified on most recent echocardiogram.  His mean gradient is less than what it was in the past however his ejection fraction is also markedly reduced as well.  He does however have what appears to be adequate excursion of his valve leaflets visually and he still has A2 on physical exam.  This would point against return of severe aortic stenosis as cause for his marked deterioration of ejection fraction.  Essential hypertension - Continuing to monitor.  Prior cecal mass anemia - Stable  Knee osteoarthritis - would not be a surgical candidate.  -I have given him furosemide to take on a daily basis instead of as needed, 40 mg once a day.  His creatinine still remains normal. - Low-dose beta-blocker has been initiated recently to hopefully help support his markedly reduced ejection fraction.  - If he was able to  tolerate, low-dose Delene Loll would be helpful and this should be considered during his close clinic follow-up on Monday. - I also offered him admission to the hospital given his current symptoms to poorly provide some degree of relief for him.  At this time, he was hesitant.  1 hour spent with patient and family.  Medication Adjustments/Labs and Tests Ordered: Current medicines are reviewed at length with the patient today.  Concerns regarding medicines are outlined above.  No orders of the defined types were placed in this encounter.  No orders of the defined types were placed in this encounter.   Patient Instructions  Medication Instructions:  Take Furosemide 40 as instructed. Continue all other medications as listed.  If you need a refill on your cardiac medications before your next appointment, please call your pharmacy.   Follow-Up: Please keep appointment as scheduled for Monday 03/06/2018 with Truitt Merle, NP.  Thank you for choosing [facility]!!          Signed, Candee Furbish, MD  03/05/2018 9:22 AM    [facility]

## 2018-03-03 NOTE — Patient Instructions (Addendum)
Medication Instructions:  Take Furosemide 40 as instructed. Continue all other medications as listed.  If you need a refill on your cardiac medications before your next appointment, please call your pharmacy.   Follow-Up: Please keep appointment as scheduled for Monday 03/06/2018 with Truitt Merle, NP.  Thank you for choosing [facility]!!

## 2018-03-06 ENCOUNTER — Encounter: Payer: Self-pay | Admitting: Nurse Practitioner

## 2018-03-06 ENCOUNTER — Other Ambulatory Visit: Payer: Self-pay | Admitting: Nurse Practitioner

## 2018-03-06 ENCOUNTER — Ambulatory Visit (INDEPENDENT_AMBULATORY_CARE_PROVIDER_SITE_OTHER): Payer: Medicare Other | Admitting: Nurse Practitioner

## 2018-03-06 ENCOUNTER — Other Ambulatory Visit: Payer: Self-pay | Admitting: *Deleted

## 2018-03-06 VITALS — BP 118/74 | HR 60 | Ht 67.0 in | Wt 153.0 lb

## 2018-03-06 DIAGNOSIS — I255 Ischemic cardiomyopathy: Secondary | ICD-10-CM

## 2018-03-06 DIAGNOSIS — I10 Essential (primary) hypertension: Secondary | ICD-10-CM

## 2018-03-06 DIAGNOSIS — I5021 Acute systolic (congestive) heart failure: Secondary | ICD-10-CM

## 2018-03-06 DIAGNOSIS — I2583 Coronary atherosclerosis due to lipid rich plaque: Secondary | ICD-10-CM | POA: Diagnosis not present

## 2018-03-06 DIAGNOSIS — I2510 Atherosclerotic heart disease of native coronary artery without angina pectoris: Secondary | ICD-10-CM

## 2018-03-06 DIAGNOSIS — Z951 Presence of aortocoronary bypass graft: Secondary | ICD-10-CM

## 2018-03-06 NOTE — Patient Instructions (Addendum)
We will be checking the following labs today - BMET & CBC    Medication Instructions:   Continue with your current medicines. BUT Let's start alternating the Lasix 20 mg a day with 40 mg a day   If you need a refill on your cardiac medications before your next appointment, please call your pharmacy.     Testing/Procedures To Be Arranged:  N/A  Follow-Up:     At [facility], you and your health needs are our priority.  As part of our continuing mission to provide you with exceptional heart care, we have created designated Provider Care Teams.  These Care Teams include your primary Cardiologist (physician) and Advanced Practice Providers (APPs -  Physician Assistants and Nurse Practitioners) who all work together to provide you with the care you need, when you need it.  Special Instructions:  . I will call and refer you to Hospice.  Call the [place] office at [phone] if you have any questions, problems or concerns.

## 2018-03-06 NOTE — Progress Notes (Signed)
CARDIOLOGY OFFICE NOTE  Date:  03/06/2018    Michael Blanchard Date of Birth: 12-Apr-1925 Medical Record #[id number]  PCP:  Seward Carol, MD  Cardiologist:  Marisa Cyphers    Chief Complaint  Patient presents with  . Congestive Heart Failure    Follow up visit - seen for Dr.Skains    History of Present Illness: Michael Blanchard is a 84 y.o. male who presents today for a 3 day check. Seen for Dr. Marlou Porch. Former patient of Dr. Sherryl Barters.   He has a known ischemic CM, remote CABG/AVR in 2001, cecum tumor in 2016.   He was here this past Friday for follow up of an echo that had been performed and showed a marked reduced ejection fraction.    At his visit this past Friday - he was having increased work of breathing - felt to have acute systolic HF. Admission was offered and he declined. Lasix was started. He had had several weeks of marked decline with lack of appetite, weight loss and what would appear to be cardiac cachexia.   Comes in today. Here with his daughter Michael Blanchard. He has been on 40 mg of Lasix over the weekend. He has had good diuresis over the weekend. Swelling has gotten better but remains short of breath. Says his breathing is "awful". No chest pain. Reiterated that he wishes to die at home. He does not wish to go into the hospital. Apparently going to have MRI to work up a renal mass. He would be quiet high risk for any procedure going forward. He is asking about Hospice services - his wife had Hospice and he had a good experience.   Past Medical History:  Diagnosis Date  . Arthritis    bil knees, right shoulder  . AVD (aortic valve disease)   . Chronic kidney disease    stage 3 kidney disease  . Complication of anesthesia    family states pt memory was bad 3-4 weeks after surgery  . Coronary atherosclerosis   . Fatigue   . Hyperlipidemia   . Hypertension   . Stroke [facility])    "I had a very milk sstroke one time the doctor said", current mini strokes  .  Trigeminal neuralgia     Past Surgical History:  Procedure Laterality Date  . AORTIC VALVE REPLACEMENT     Carpentier-Edwards Pericardial Valve, MR Safe 1.5T  . CARDIAC CATHETERIZATION  06/22/1999   SEVERE HYPOKINESIA OF THE MID TO DISTAL INFERIOR WALL AND APEX.MODERATE LEFT VENTRICULAR DYSFUNCTION WITH EF 45%  . COLONOSCOPY N/A 01/12/2015   Procedure: COLONOSCOPY;  Surgeon: Wilford Corner, MD;  Location: [facility] ENDOSCOPY;  Service: Endoscopy;  Laterality: N/A;  . CORONARY ARTERY BYPASS GRAFT  2001  . ESOPHAGOGASTRODUODENOSCOPY N/A 01/10/2015   Procedure: ESOPHAGOGASTRODUODENOSCOPY (EGD);  Surgeon: Clarene Essex, MD;  Location: [facility] ENDOSCOPY;  Service: Endoscopy;  Laterality: N/A;     Medications: Current Meds  Medication Sig  . Calcium Carbonate-Vitamin D (CALCIUM 600 + D PO) Take 1 tablet by mouth at bedtime.   . carbamazepine (TEGRETOL) 200 MG tablet TAKE 1 TABLET BY MOUTH TWICE DAILY  . carvedilol (COREG) 6.25 MG tablet Take 6.25 mg by mouth 2 (two) times daily.  . clopidogrel (PLAVIX) 75 MG tablet TAKE 1 TABLET BY MOUTH DAILY. GENERIC EQUIVALENT FOR PLAVIX  . CRANBERRY PO Take 4,200 mg by mouth 2 (two) times daily.  Marland Kitchen donepezil (ARICEPT) 10 MG tablet TAKE 1 TABLET BY MOUTH AT BEDTIME  . furosemide (  LASIX) 20 MG tablet Take 20 mg by mouth daily. Alternate 20 mg a day with 40 mg a day  . HYDROcodone-acetaminophen (NORCO) 7.5-325 MG tablet Take 1 tablet by mouth every 8 (eight) hours as needed for moderate pain.  . Misc Natural Products (OSTEO BI-FLEX ADV DOUBLE ST PO) Take 1 tablet by mouth 2 (two) times daily.  . Multiple Vitamins-Minerals (CENTRUM SILVER 50+MEN PO) Take 1 tablet by mouth daily.  . nitroGLYCERIN (NITROSTAT) 0.4 MG SL tablet Place 1 tablet (0.4 mg total) under the tongue every 5 (five) minutes as needed for chest pain.  . polyethylene glycol (MIRALAX / GLYCOLAX) packet Take 17 g by mouth daily as needed for moderate constipation.   . rosuvastatin (CRESTOR) 10 MG tablet  Take 5 mg by mouth 3 (three) times a week.  . sennosides-docusate sodium (SENOKOT-S) 8.6-50 MG tablet Take 1 tablet by mouth 3 (three) times daily as needed for constipation (when taking Norco).   . tamsulosin (FLOMAX) 0.4 MG CAPS capsule Take 0.4 mg by mouth daily with lunch.      Allergies: Allergies  Allergen Reactions  . Gabapentin     Unknown rxn  . Lipitor [Atorvastatin Calcium]     Unknown rxn  . Niaspan [Niacin]     Unknown rxn    Social History: The patient  reports that he quit smoking about 32 years ago. He has never used smokeless tobacco. He reports that he does not drink alcohol or use drugs.   Family History: The patient's family history includes Aneurysm in his father and mother; Heart disease in his brother; Stroke in his mother.   Review of Systems: Please see the history of present illness.   Otherwise, the review of systems is positive for none.   All other systems are reviewed and negative.   Physical Exam: VS:  BP 118/74   Pulse 60   Ht 5\' 7"  (1.702 m)   Wt 153 lb (69.4 kg)   BMI 23.96 kg/m  .  BMI Body mass index is 23.96 kg/m.  Wt Readings from Last 3 Encounters:  03/06/18 153 lb (69.4 kg)  03/03/18 158 lb (71.7 kg)  08/24/17 167 lb 1.9 oz (75.8 kg)    General: Elderly. Alert and chronically ill appearing. He is no acute distress.   HEENT: Normal.  Neck: Supple, no JVD, carotid bruits, or masses noted.  Cardiac: Regular rate and rhythm. Hr is a little fast on exam. Trace edema.  Respiratory:  Lungs are fairly clear to auscultation bilaterally with normal work of breathing.  GI: Soft and nontender.  MS: No deformity or atrophy. Gait not tested.  Skin: Warm and dry. Color is normal.  Neuro:  Strength and sensation are intact and no gross focal deficits noted.  Psych: Alert, appropriate and with normal affect.   LABORATORY DATA:  EKG:  EKG is not ordered today.  Lab Results  Component Value Date   WBC 6.3 05/01/2015   HGB 11.4 (L)  05/01/2015   HCT 35.3 (L) 05/01/2015   PLT 190 05/01/2015   GLUCOSE 75 05/01/2015   CHOL 159 05/22/2012   TRIG 131.0 05/22/2012   HDL 43.80 05/22/2012   LDLCALC 89 05/22/2012   ALT 9 (L) 01/07/2015   AST 20 01/07/2015   NA 138 05/01/2015   K 4.5 05/01/2015   CL 106 05/01/2015   CREATININE 1.09 05/01/2015   BUN 20 05/01/2015   CO2 26 05/01/2015   TSH 1.721 01/07/2015   INR 1.27 01/07/2015  HGBA1C 5.5 02/14/2015       BNP (last 3 results) No results for input(s): BNP in the last 8760 hours.  ProBNP (last 3 results) No results for input(s): PROBNP in the last 8760 hours.   Other Studies Reviewed Today:  Echo Study Conclusions 02/2018  - Left ventricle: The cavity size was mildly dilated. There was   mild concentric hypertrophy. Systolic function was normal. The   estimated ejection fraction was in the range of 10% to 15%. Wall   motion was normal; there were no regional wall motion   abnormalities. Features are consistent with a pseudonormal left   ventricular filling pattern, with concomitant abnormal relaxation   and increased filling pressure (grade 2 diastolic dysfunction).   Doppler parameters are consistent with elevated ventricular   end-diastolic filling pressure. - Aortic valve: S/P AVR with a bioprosthesis with severely   thickened, severely calcified leaflets. There was mild stenosis.   Mean gradient (S): 13 mm Hg. Peak gradient (S): 27 mm Hg. - Mitral valve: The findings are consistent with mild stenosis.   There was moderate regurgitation. - Left atrium: The atrium was mildly dilated. - Right ventricle: The cavity size was mildly dilated. Wall   thickness was normal. Systolic function was moderately reduced. - Right atrium: The atrium was mildly dilated. - Inferior vena cava: The vessel was dilated. The respirophasic   diameter changes were blunted (< 50%), consistent with elevated   central venous pressure.  Impressions:  - Since the prior  study on 10/25/2016there has been a significant   change, LVEF has decreased from 55-60% to 10-15%. Transaortic   gradients across the bioprosthetic valve are mildly elevated,   however most probably represent low flow/low gradient severe   aortic stenosis. RVEF is moderately decreased.  Assessment/Plan:  1. Acute systolic HF - cardiac cachexia - overall prognosis is felt to be poor - clinically with some improvement in his swelling but shortness of breath persists as well as orthopnea. He would like a referral to Hospice. [place] lab today. He is a DNR. Would favor comfort care going forward.   His address is actually [address]; [place]. The address in his record is his daughter's house.   2. Ischemic CM - no active chest pain.   3. Remote CABG/AVR  4. HTN - BP soft. Would leave on current regimen for now.   5. Renal mass - he would be high risk for any procedure. They are considering not proceeding with MRI. Routing my note to urology today as well.   6. History of aortic valve replacement 2001 - Noted by Dr. Marlou Porch that it appears severely thickened and calcified on most recent echocardiogram.  His mean gradient is less than what it was in the past however his ejection fraction is also markedly reduced as well.  He does however have what appears to be adequate excursion of his valve leaflets visually and he still has A2 on physical exam.  This would point against return of severe aortic stenosis as cause for his marked deterioration of ejection fraction.  Current medicines are reviewed with the patient today.  The patient does not have concerns regarding medicines other than what has been noted above.  The following changes have been made:  See above.  Labs/ tests ordered today include:    Orders Placed This Encounter  Procedures  . Basic metabolic panel  . CBC     Disposition:   Referral to Hospice today.  Patient is agreeable to this plan and will  call if any problems develop in the interim.   SignedTruitt Merle, NP  03/06/2018 8:29 AM  [facility] [address] [place], [place]  [address] Phone: [phone] Fax: [phone]

## 2018-03-07 DIAGNOSIS — G500 Trigeminal neuralgia: Secondary | ICD-10-CM | POA: Diagnosis not present

## 2018-03-07 DIAGNOSIS — I5040 Unspecified combined systolic (congestive) and diastolic (congestive) heart failure: Secondary | ICD-10-CM | POA: Diagnosis not present

## 2018-03-07 DIAGNOSIS — I359 Nonrheumatic aortic valve disorder, unspecified: Secondary | ICD-10-CM | POA: Diagnosis not present

## 2018-03-07 DIAGNOSIS — I10 Essential (primary) hypertension: Secondary | ICD-10-CM | POA: Diagnosis not present

## 2018-03-07 DIAGNOSIS — E785 Hyperlipidemia, unspecified: Secondary | ICD-10-CM | POA: Diagnosis not present

## 2018-03-07 DIAGNOSIS — N401 Enlarged prostate with lower urinary tract symptoms: Secondary | ICD-10-CM | POA: Diagnosis not present

## 2018-03-07 DIAGNOSIS — G309 Alzheimer's disease, unspecified: Secondary | ICD-10-CM | POA: Diagnosis not present

## 2018-03-07 DIAGNOSIS — N189 Chronic kidney disease, unspecified: Secondary | ICD-10-CM | POA: Diagnosis not present

## 2018-03-07 LAB — BASIC METABOLIC PANEL
BUN/Creatinine Ratio: 15 (ref 10–24)
BUN: 27 mg/dL (ref 10–36)
CO2: 22 mmol/L (ref 20–29)
Calcium: 9.6 mg/dL (ref 8.6–10.2)
Chloride: 98 mmol/L (ref 96–106)
Creatinine, Ser: 1.78 mg/dL — ABNORMAL HIGH (ref 0.76–1.27)
GFR calc Af Amer: 38 mL/min/{1.73_m2} — ABNORMAL LOW (ref >59)
GFR calc non Af Amer: 33 mL/min/{1.73_m2} — ABNORMAL LOW (ref >59)
Glucose: 147 mg/dL — ABNORMAL HIGH (ref 65–99)
Potassium: 4.3 mmol/L (ref 3.5–5.2)
Sodium: 139 mmol/L (ref 134–144)

## 2018-03-07 LAB — CBC
Hematocrit: 41.2 % (ref 37.5–51.0)
Hemoglobin: 13.6 g/dL (ref 13.0–17.7)
MCH: 32.8 pg (ref 26.6–33.0)
MCHC: 33 g/dL (ref 31.5–35.7)
MCV: 99 fL — ABNORMAL HIGH (ref 79–97)
Platelets: 217 10*3/uL (ref 150–450)
RBC: 4.15 x10E6/uL (ref 4.14–5.80)
RDW: 13.5 % (ref 12.3–15.4)
WBC: 7.8 10*3/uL (ref 3.4–10.8)

## 2018-03-09 DIAGNOSIS — I5040 Unspecified combined systolic (congestive) and diastolic (congestive) heart failure: Secondary | ICD-10-CM | POA: Diagnosis not present

## 2018-03-09 DIAGNOSIS — G500 Trigeminal neuralgia: Secondary | ICD-10-CM | POA: Diagnosis not present

## 2018-03-09 DIAGNOSIS — N189 Chronic kidney disease, unspecified: Secondary | ICD-10-CM | POA: Diagnosis not present

## 2018-03-09 DIAGNOSIS — E785 Hyperlipidemia, unspecified: Secondary | ICD-10-CM | POA: Diagnosis not present

## 2018-03-09 DIAGNOSIS — I10 Essential (primary) hypertension: Secondary | ICD-10-CM | POA: Diagnosis not present

## 2018-03-09 DIAGNOSIS — I359 Nonrheumatic aortic valve disorder, unspecified: Secondary | ICD-10-CM | POA: Diagnosis not present

## 2018-03-10 DIAGNOSIS — N189 Chronic kidney disease, unspecified: Secondary | ICD-10-CM | POA: Diagnosis not present

## 2018-03-10 DIAGNOSIS — I359 Nonrheumatic aortic valve disorder, unspecified: Secondary | ICD-10-CM | POA: Diagnosis not present

## 2018-03-10 DIAGNOSIS — E785 Hyperlipidemia, unspecified: Secondary | ICD-10-CM | POA: Diagnosis not present

## 2018-03-10 DIAGNOSIS — G500 Trigeminal neuralgia: Secondary | ICD-10-CM | POA: Diagnosis not present

## 2018-03-10 DIAGNOSIS — I10 Essential (primary) hypertension: Secondary | ICD-10-CM | POA: Diagnosis not present

## 2018-03-10 DIAGNOSIS — I5040 Unspecified combined systolic (congestive) and diastolic (congestive) heart failure: Secondary | ICD-10-CM | POA: Diagnosis not present

## 2018-03-14 DIAGNOSIS — I10 Essential (primary) hypertension: Secondary | ICD-10-CM | POA: Diagnosis not present

## 2018-03-14 DIAGNOSIS — N189 Chronic kidney disease, unspecified: Secondary | ICD-10-CM | POA: Diagnosis not present

## 2018-03-14 DIAGNOSIS — I5040 Unspecified combined systolic (congestive) and diastolic (congestive) heart failure: Secondary | ICD-10-CM | POA: Diagnosis not present

## 2018-03-14 DIAGNOSIS — E785 Hyperlipidemia, unspecified: Secondary | ICD-10-CM | POA: Diagnosis not present

## 2018-03-14 DIAGNOSIS — G500 Trigeminal neuralgia: Secondary | ICD-10-CM | POA: Diagnosis not present

## 2018-03-14 DIAGNOSIS — I359 Nonrheumatic aortic valve disorder, unspecified: Secondary | ICD-10-CM | POA: Diagnosis not present

## 2018-03-15 DIAGNOSIS — N189 Chronic kidney disease, unspecified: Secondary | ICD-10-CM | POA: Diagnosis not present

## 2018-03-15 DIAGNOSIS — G309 Alzheimer's disease, unspecified: Secondary | ICD-10-CM | POA: Diagnosis not present

## 2018-03-15 DIAGNOSIS — G500 Trigeminal neuralgia: Secondary | ICD-10-CM | POA: Diagnosis not present

## 2018-03-15 DIAGNOSIS — I359 Nonrheumatic aortic valve disorder, unspecified: Secondary | ICD-10-CM | POA: Diagnosis not present

## 2018-03-15 DIAGNOSIS — I10 Essential (primary) hypertension: Secondary | ICD-10-CM | POA: Diagnosis not present

## 2018-03-15 DIAGNOSIS — I5040 Unspecified combined systolic (congestive) and diastolic (congestive) heart failure: Secondary | ICD-10-CM | POA: Diagnosis not present

## 2018-03-15 DIAGNOSIS — E785 Hyperlipidemia, unspecified: Secondary | ICD-10-CM | POA: Diagnosis not present

## 2018-03-15 DIAGNOSIS — N401 Enlarged prostate with lower urinary tract symptoms: Secondary | ICD-10-CM | POA: Diagnosis not present

## 2018-03-20 ENCOUNTER — Telehealth: Payer: Self-pay | Admitting: *Deleted

## 2018-03-20 NOTE — Telephone Encounter (Signed)
Faxing to Hospice @ [phone] pts confirmation of medications.

## 2018-03-21 ENCOUNTER — Telehealth: Payer: Self-pay | Admitting: *Deleted

## 2018-03-21 DIAGNOSIS — N189 Chronic kidney disease, unspecified: Secondary | ICD-10-CM | POA: Diagnosis not present

## 2018-03-21 DIAGNOSIS — E785 Hyperlipidemia, unspecified: Secondary | ICD-10-CM | POA: Diagnosis not present

## 2018-03-21 DIAGNOSIS — I5040 Unspecified combined systolic (congestive) and diastolic (congestive) heart failure: Secondary | ICD-10-CM | POA: Diagnosis not present

## 2018-03-21 DIAGNOSIS — G500 Trigeminal neuralgia: Secondary | ICD-10-CM | POA: Diagnosis not present

## 2018-03-21 DIAGNOSIS — I359 Nonrheumatic aortic valve disorder, unspecified: Secondary | ICD-10-CM | POA: Diagnosis not present

## 2018-03-21 DIAGNOSIS — I10 Essential (primary) hypertension: Secondary | ICD-10-CM | POA: Diagnosis not present

## 2018-03-21 NOTE — Telephone Encounter (Signed)
Faxed to hospice @ [phone] pt's signed admissions letter to hospice.

## 2018-03-23 ENCOUNTER — Other Ambulatory Visit: Payer: Medicare Other

## 2018-03-24 DIAGNOSIS — E785 Hyperlipidemia, unspecified: Secondary | ICD-10-CM | POA: Diagnosis not present

## 2018-03-24 DIAGNOSIS — I5040 Unspecified combined systolic (congestive) and diastolic (congestive) heart failure: Secondary | ICD-10-CM | POA: Diagnosis not present

## 2018-03-24 DIAGNOSIS — N189 Chronic kidney disease, unspecified: Secondary | ICD-10-CM | POA: Diagnosis not present

## 2018-03-24 DIAGNOSIS — G500 Trigeminal neuralgia: Secondary | ICD-10-CM | POA: Diagnosis not present

## 2018-03-24 DIAGNOSIS — I359 Nonrheumatic aortic valve disorder, unspecified: Secondary | ICD-10-CM | POA: Diagnosis not present

## 2018-03-24 DIAGNOSIS — I10 Essential (primary) hypertension: Secondary | ICD-10-CM | POA: Diagnosis not present

## 2018-03-29 DIAGNOSIS — I359 Nonrheumatic aortic valve disorder, unspecified: Secondary | ICD-10-CM | POA: Diagnosis not present

## 2018-03-29 DIAGNOSIS — I10 Essential (primary) hypertension: Secondary | ICD-10-CM | POA: Diagnosis not present

## 2018-03-29 DIAGNOSIS — G500 Trigeminal neuralgia: Secondary | ICD-10-CM | POA: Diagnosis not present

## 2018-03-29 DIAGNOSIS — I5040 Unspecified combined systolic (congestive) and diastolic (congestive) heart failure: Secondary | ICD-10-CM | POA: Diagnosis not present

## 2018-03-29 DIAGNOSIS — N189 Chronic kidney disease, unspecified: Secondary | ICD-10-CM | POA: Diagnosis not present

## 2018-03-29 DIAGNOSIS — E785 Hyperlipidemia, unspecified: Secondary | ICD-10-CM | POA: Diagnosis not present

## 2018-03-31 DIAGNOSIS — N189 Chronic kidney disease, unspecified: Secondary | ICD-10-CM | POA: Diagnosis not present

## 2018-03-31 DIAGNOSIS — I10 Essential (primary) hypertension: Secondary | ICD-10-CM | POA: Diagnosis not present

## 2018-03-31 DIAGNOSIS — G500 Trigeminal neuralgia: Secondary | ICD-10-CM | POA: Diagnosis not present

## 2018-03-31 DIAGNOSIS — I359 Nonrheumatic aortic valve disorder, unspecified: Secondary | ICD-10-CM | POA: Diagnosis not present

## 2018-03-31 DIAGNOSIS — E785 Hyperlipidemia, unspecified: Secondary | ICD-10-CM | POA: Diagnosis not present

## 2018-03-31 DIAGNOSIS — I5040 Unspecified combined systolic (congestive) and diastolic (congestive) heart failure: Secondary | ICD-10-CM | POA: Diagnosis not present

## 2018-04-04 DIAGNOSIS — E785 Hyperlipidemia, unspecified: Secondary | ICD-10-CM | POA: Diagnosis not present

## 2018-04-04 DIAGNOSIS — I10 Essential (primary) hypertension: Secondary | ICD-10-CM | POA: Diagnosis not present

## 2018-04-04 DIAGNOSIS — G500 Trigeminal neuralgia: Secondary | ICD-10-CM | POA: Diagnosis not present

## 2018-04-04 DIAGNOSIS — I5040 Unspecified combined systolic (congestive) and diastolic (congestive) heart failure: Secondary | ICD-10-CM | POA: Diagnosis not present

## 2018-04-04 DIAGNOSIS — N189 Chronic kidney disease, unspecified: Secondary | ICD-10-CM | POA: Diagnosis not present

## 2018-04-04 DIAGNOSIS — I359 Nonrheumatic aortic valve disorder, unspecified: Secondary | ICD-10-CM | POA: Diagnosis not present

## 2018-04-07 DIAGNOSIS — I359 Nonrheumatic aortic valve disorder, unspecified: Secondary | ICD-10-CM | POA: Diagnosis not present

## 2018-04-07 DIAGNOSIS — E785 Hyperlipidemia, unspecified: Secondary | ICD-10-CM | POA: Diagnosis not present

## 2018-04-07 DIAGNOSIS — N189 Chronic kidney disease, unspecified: Secondary | ICD-10-CM | POA: Diagnosis not present

## 2018-04-07 DIAGNOSIS — I10 Essential (primary) hypertension: Secondary | ICD-10-CM | POA: Diagnosis not present

## 2018-04-07 DIAGNOSIS — I5040 Unspecified combined systolic (congestive) and diastolic (congestive) heart failure: Secondary | ICD-10-CM | POA: Diagnosis not present

## 2018-04-07 DIAGNOSIS — G500 Trigeminal neuralgia: Secondary | ICD-10-CM | POA: Diagnosis not present

## 2018-04-11 DIAGNOSIS — E785 Hyperlipidemia, unspecified: Secondary | ICD-10-CM | POA: Diagnosis not present

## 2018-04-11 DIAGNOSIS — G500 Trigeminal neuralgia: Secondary | ICD-10-CM | POA: Diagnosis not present

## 2018-04-11 DIAGNOSIS — I359 Nonrheumatic aortic valve disorder, unspecified: Secondary | ICD-10-CM | POA: Diagnosis not present

## 2018-04-11 DIAGNOSIS — N189 Chronic kidney disease, unspecified: Secondary | ICD-10-CM | POA: Diagnosis not present

## 2018-04-11 DIAGNOSIS — I10 Essential (primary) hypertension: Secondary | ICD-10-CM | POA: Diagnosis not present

## 2018-04-11 DIAGNOSIS — I5040 Unspecified combined systolic (congestive) and diastolic (congestive) heart failure: Secondary | ICD-10-CM | POA: Diagnosis not present

## 2018-04-13 DIAGNOSIS — G500 Trigeminal neuralgia: Secondary | ICD-10-CM | POA: Diagnosis not present

## 2018-04-13 DIAGNOSIS — I359 Nonrheumatic aortic valve disorder, unspecified: Secondary | ICD-10-CM | POA: Diagnosis not present

## 2018-04-13 DIAGNOSIS — E785 Hyperlipidemia, unspecified: Secondary | ICD-10-CM | POA: Diagnosis not present

## 2018-04-13 DIAGNOSIS — I5040 Unspecified combined systolic (congestive) and diastolic (congestive) heart failure: Secondary | ICD-10-CM | POA: Diagnosis not present

## 2018-04-13 DIAGNOSIS — N189 Chronic kidney disease, unspecified: Secondary | ICD-10-CM | POA: Diagnosis not present

## 2018-04-13 DIAGNOSIS — I10 Essential (primary) hypertension: Secondary | ICD-10-CM | POA: Diagnosis not present

## 2018-04-14 DIAGNOSIS — G500 Trigeminal neuralgia: Secondary | ICD-10-CM | POA: Diagnosis not present

## 2018-04-14 DIAGNOSIS — I359 Nonrheumatic aortic valve disorder, unspecified: Secondary | ICD-10-CM | POA: Diagnosis not present

## 2018-04-14 DIAGNOSIS — E785 Hyperlipidemia, unspecified: Secondary | ICD-10-CM | POA: Diagnosis not present

## 2018-04-14 DIAGNOSIS — I10 Essential (primary) hypertension: Secondary | ICD-10-CM | POA: Diagnosis not present

## 2018-04-14 DIAGNOSIS — I5040 Unspecified combined systolic (congestive) and diastolic (congestive) heart failure: Secondary | ICD-10-CM | POA: Diagnosis not present

## 2018-04-14 DIAGNOSIS — N189 Chronic kidney disease, unspecified: Secondary | ICD-10-CM | POA: Diagnosis not present

## 2018-04-15 DIAGNOSIS — G309 Alzheimer's disease, unspecified: Secondary | ICD-10-CM | POA: Diagnosis not present

## 2018-04-15 DIAGNOSIS — I359 Nonrheumatic aortic valve disorder, unspecified: Secondary | ICD-10-CM | POA: Diagnosis not present

## 2018-04-15 DIAGNOSIS — G500 Trigeminal neuralgia: Secondary | ICD-10-CM | POA: Diagnosis not present

## 2018-04-15 DIAGNOSIS — I10 Essential (primary) hypertension: Secondary | ICD-10-CM | POA: Diagnosis not present

## 2018-04-15 DIAGNOSIS — N189 Chronic kidney disease, unspecified: Secondary | ICD-10-CM | POA: Diagnosis not present

## 2018-04-15 DIAGNOSIS — I5040 Unspecified combined systolic (congestive) and diastolic (congestive) heart failure: Secondary | ICD-10-CM | POA: Diagnosis not present

## 2018-04-15 DIAGNOSIS — N401 Enlarged prostate with lower urinary tract symptoms: Secondary | ICD-10-CM | POA: Diagnosis not present

## 2018-04-15 DIAGNOSIS — E785 Hyperlipidemia, unspecified: Secondary | ICD-10-CM | POA: Diagnosis not present

## 2018-04-18 DIAGNOSIS — I359 Nonrheumatic aortic valve disorder, unspecified: Secondary | ICD-10-CM | POA: Diagnosis not present

## 2018-04-18 DIAGNOSIS — I10 Essential (primary) hypertension: Secondary | ICD-10-CM | POA: Diagnosis not present

## 2018-04-18 DIAGNOSIS — I5040 Unspecified combined systolic (congestive) and diastolic (congestive) heart failure: Secondary | ICD-10-CM | POA: Diagnosis not present

## 2018-04-18 DIAGNOSIS — G500 Trigeminal neuralgia: Secondary | ICD-10-CM | POA: Diagnosis not present

## 2018-04-18 DIAGNOSIS — E785 Hyperlipidemia, unspecified: Secondary | ICD-10-CM | POA: Diagnosis not present

## 2018-04-18 DIAGNOSIS — N189 Chronic kidney disease, unspecified: Secondary | ICD-10-CM | POA: Diagnosis not present

## 2018-04-21 DIAGNOSIS — G500 Trigeminal neuralgia: Secondary | ICD-10-CM | POA: Diagnosis not present

## 2018-04-21 DIAGNOSIS — I10 Essential (primary) hypertension: Secondary | ICD-10-CM | POA: Diagnosis not present

## 2018-04-21 DIAGNOSIS — I5040 Unspecified combined systolic (congestive) and diastolic (congestive) heart failure: Secondary | ICD-10-CM | POA: Diagnosis not present

## 2018-04-21 DIAGNOSIS — N189 Chronic kidney disease, unspecified: Secondary | ICD-10-CM | POA: Diagnosis not present

## 2018-04-21 DIAGNOSIS — E785 Hyperlipidemia, unspecified: Secondary | ICD-10-CM | POA: Diagnosis not present

## 2018-04-21 DIAGNOSIS — I359 Nonrheumatic aortic valve disorder, unspecified: Secondary | ICD-10-CM | POA: Diagnosis not present

## 2018-04-23 DIAGNOSIS — I359 Nonrheumatic aortic valve disorder, unspecified: Secondary | ICD-10-CM | POA: Diagnosis not present

## 2018-04-23 DIAGNOSIS — I10 Essential (primary) hypertension: Secondary | ICD-10-CM | POA: Diagnosis not present

## 2018-04-23 DIAGNOSIS — I5040 Unspecified combined systolic (congestive) and diastolic (congestive) heart failure: Secondary | ICD-10-CM | POA: Diagnosis not present

## 2018-04-23 DIAGNOSIS — N189 Chronic kidney disease, unspecified: Secondary | ICD-10-CM | POA: Diagnosis not present

## 2018-04-23 DIAGNOSIS — E785 Hyperlipidemia, unspecified: Secondary | ICD-10-CM | POA: Diagnosis not present

## 2018-04-23 DIAGNOSIS — G500 Trigeminal neuralgia: Secondary | ICD-10-CM | POA: Diagnosis not present

## 2018-04-24 DIAGNOSIS — I5040 Unspecified combined systolic (congestive) and diastolic (congestive) heart failure: Secondary | ICD-10-CM | POA: Diagnosis not present

## 2018-04-24 DIAGNOSIS — N189 Chronic kidney disease, unspecified: Secondary | ICD-10-CM | POA: Diagnosis not present

## 2018-04-24 DIAGNOSIS — E785 Hyperlipidemia, unspecified: Secondary | ICD-10-CM | POA: Diagnosis not present

## 2018-04-24 DIAGNOSIS — I10 Essential (primary) hypertension: Secondary | ICD-10-CM | POA: Diagnosis not present

## 2018-04-24 DIAGNOSIS — G500 Trigeminal neuralgia: Secondary | ICD-10-CM | POA: Diagnosis not present

## 2018-04-24 DIAGNOSIS — I359 Nonrheumatic aortic valve disorder, unspecified: Secondary | ICD-10-CM | POA: Diagnosis not present

## 2018-04-25 DIAGNOSIS — I5040 Unspecified combined systolic (congestive) and diastolic (congestive) heart failure: Secondary | ICD-10-CM | POA: Diagnosis not present

## 2018-04-25 DIAGNOSIS — G500 Trigeminal neuralgia: Secondary | ICD-10-CM | POA: Diagnosis not present

## 2018-04-25 DIAGNOSIS — N189 Chronic kidney disease, unspecified: Secondary | ICD-10-CM | POA: Diagnosis not present

## 2018-04-25 DIAGNOSIS — I359 Nonrheumatic aortic valve disorder, unspecified: Secondary | ICD-10-CM | POA: Diagnosis not present

## 2018-04-25 DIAGNOSIS — I10 Essential (primary) hypertension: Secondary | ICD-10-CM | POA: Diagnosis not present

## 2018-04-25 DIAGNOSIS — E785 Hyperlipidemia, unspecified: Secondary | ICD-10-CM | POA: Diagnosis not present

## 2018-04-28 DIAGNOSIS — N189 Chronic kidney disease, unspecified: Secondary | ICD-10-CM | POA: Diagnosis not present

## 2018-04-28 DIAGNOSIS — I10 Essential (primary) hypertension: Secondary | ICD-10-CM | POA: Diagnosis not present

## 2018-04-28 DIAGNOSIS — I359 Nonrheumatic aortic valve disorder, unspecified: Secondary | ICD-10-CM | POA: Diagnosis not present

## 2018-04-28 DIAGNOSIS — E785 Hyperlipidemia, unspecified: Secondary | ICD-10-CM | POA: Diagnosis not present

## 2018-04-28 DIAGNOSIS — G500 Trigeminal neuralgia: Secondary | ICD-10-CM | POA: Diagnosis not present

## 2018-04-28 DIAGNOSIS — I5040 Unspecified combined systolic (congestive) and diastolic (congestive) heart failure: Secondary | ICD-10-CM | POA: Diagnosis not present

## 2018-05-01 DIAGNOSIS — I5040 Unspecified combined systolic (congestive) and diastolic (congestive) heart failure: Secondary | ICD-10-CM | POA: Diagnosis not present

## 2018-05-01 DIAGNOSIS — E785 Hyperlipidemia, unspecified: Secondary | ICD-10-CM | POA: Diagnosis not present

## 2018-05-01 DIAGNOSIS — I359 Nonrheumatic aortic valve disorder, unspecified: Secondary | ICD-10-CM | POA: Diagnosis not present

## 2018-05-01 DIAGNOSIS — G500 Trigeminal neuralgia: Secondary | ICD-10-CM | POA: Diagnosis not present

## 2018-05-01 DIAGNOSIS — I10 Essential (primary) hypertension: Secondary | ICD-10-CM | POA: Diagnosis not present

## 2018-05-01 DIAGNOSIS — N189 Chronic kidney disease, unspecified: Secondary | ICD-10-CM | POA: Diagnosis not present

## 2018-05-04 DIAGNOSIS — I10 Essential (primary) hypertension: Secondary | ICD-10-CM | POA: Diagnosis not present

## 2018-05-04 DIAGNOSIS — N189 Chronic kidney disease, unspecified: Secondary | ICD-10-CM | POA: Diagnosis not present

## 2018-05-04 DIAGNOSIS — E785 Hyperlipidemia, unspecified: Secondary | ICD-10-CM | POA: Diagnosis not present

## 2018-05-04 DIAGNOSIS — G500 Trigeminal neuralgia: Secondary | ICD-10-CM | POA: Diagnosis not present

## 2018-05-04 DIAGNOSIS — I359 Nonrheumatic aortic valve disorder, unspecified: Secondary | ICD-10-CM | POA: Diagnosis not present

## 2018-05-04 DIAGNOSIS — I5040 Unspecified combined systolic (congestive) and diastolic (congestive) heart failure: Secondary | ICD-10-CM | POA: Diagnosis not present

## 2018-05-08 DIAGNOSIS — I10 Essential (primary) hypertension: Secondary | ICD-10-CM | POA: Diagnosis not present

## 2018-05-08 DIAGNOSIS — G500 Trigeminal neuralgia: Secondary | ICD-10-CM | POA: Diagnosis not present

## 2018-05-08 DIAGNOSIS — N189 Chronic kidney disease, unspecified: Secondary | ICD-10-CM | POA: Diagnosis not present

## 2018-05-08 DIAGNOSIS — E785 Hyperlipidemia, unspecified: Secondary | ICD-10-CM | POA: Diagnosis not present

## 2018-05-08 DIAGNOSIS — I5040 Unspecified combined systolic (congestive) and diastolic (congestive) heart failure: Secondary | ICD-10-CM | POA: Diagnosis not present

## 2018-05-08 DIAGNOSIS — I359 Nonrheumatic aortic valve disorder, unspecified: Secondary | ICD-10-CM | POA: Diagnosis not present

## 2018-05-10 DIAGNOSIS — N189 Chronic kidney disease, unspecified: Secondary | ICD-10-CM | POA: Diagnosis not present

## 2018-05-10 DIAGNOSIS — G500 Trigeminal neuralgia: Secondary | ICD-10-CM | POA: Diagnosis not present

## 2018-05-10 DIAGNOSIS — I5040 Unspecified combined systolic (congestive) and diastolic (congestive) heart failure: Secondary | ICD-10-CM | POA: Diagnosis not present

## 2018-05-10 DIAGNOSIS — I359 Nonrheumatic aortic valve disorder, unspecified: Secondary | ICD-10-CM | POA: Diagnosis not present

## 2018-05-10 DIAGNOSIS — E785 Hyperlipidemia, unspecified: Secondary | ICD-10-CM | POA: Diagnosis not present

## 2018-05-10 DIAGNOSIS — I10 Essential (primary) hypertension: Secondary | ICD-10-CM | POA: Diagnosis not present

## 2018-05-11 DIAGNOSIS — I359 Nonrheumatic aortic valve disorder, unspecified: Secondary | ICD-10-CM | POA: Diagnosis not present

## 2018-05-11 DIAGNOSIS — I10 Essential (primary) hypertension: Secondary | ICD-10-CM | POA: Diagnosis not present

## 2018-05-11 DIAGNOSIS — I5040 Unspecified combined systolic (congestive) and diastolic (congestive) heart failure: Secondary | ICD-10-CM | POA: Diagnosis not present

## 2018-05-11 DIAGNOSIS — N189 Chronic kidney disease, unspecified: Secondary | ICD-10-CM | POA: Diagnosis not present

## 2018-05-11 DIAGNOSIS — G500 Trigeminal neuralgia: Secondary | ICD-10-CM | POA: Diagnosis not present

## 2018-05-11 DIAGNOSIS — E785 Hyperlipidemia, unspecified: Secondary | ICD-10-CM | POA: Diagnosis not present

## 2018-05-14 DIAGNOSIS — E785 Hyperlipidemia, unspecified: Secondary | ICD-10-CM | POA: Diagnosis not present

## 2018-05-14 DIAGNOSIS — I10 Essential (primary) hypertension: Secondary | ICD-10-CM | POA: Diagnosis not present

## 2018-05-14 DIAGNOSIS — I5040 Unspecified combined systolic (congestive) and diastolic (congestive) heart failure: Secondary | ICD-10-CM | POA: Diagnosis not present

## 2018-05-14 DIAGNOSIS — N401 Enlarged prostate with lower urinary tract symptoms: Secondary | ICD-10-CM | POA: Diagnosis not present

## 2018-05-14 DIAGNOSIS — G309 Alzheimer's disease, unspecified: Secondary | ICD-10-CM | POA: Diagnosis not present

## 2018-05-14 DIAGNOSIS — N189 Chronic kidney disease, unspecified: Secondary | ICD-10-CM | POA: Diagnosis not present

## 2018-05-14 DIAGNOSIS — G500 Trigeminal neuralgia: Secondary | ICD-10-CM | POA: Diagnosis not present

## 2018-05-14 DIAGNOSIS — I359 Nonrheumatic aortic valve disorder, unspecified: Secondary | ICD-10-CM | POA: Diagnosis not present

## 2018-05-15 DIAGNOSIS — I359 Nonrheumatic aortic valve disorder, unspecified: Secondary | ICD-10-CM | POA: Diagnosis not present

## 2018-05-15 DIAGNOSIS — I5040 Unspecified combined systolic (congestive) and diastolic (congestive) heart failure: Secondary | ICD-10-CM | POA: Diagnosis not present

## 2018-05-15 DIAGNOSIS — I10 Essential (primary) hypertension: Secondary | ICD-10-CM | POA: Diagnosis not present

## 2018-05-15 DIAGNOSIS — E785 Hyperlipidemia, unspecified: Secondary | ICD-10-CM | POA: Diagnosis not present

## 2018-05-15 DIAGNOSIS — N189 Chronic kidney disease, unspecified: Secondary | ICD-10-CM | POA: Diagnosis not present

## 2018-05-15 DIAGNOSIS — G500 Trigeminal neuralgia: Secondary | ICD-10-CM | POA: Diagnosis not present

## 2018-05-17 DIAGNOSIS — I5040 Unspecified combined systolic (congestive) and diastolic (congestive) heart failure: Secondary | ICD-10-CM | POA: Diagnosis not present

## 2018-05-17 DIAGNOSIS — I359 Nonrheumatic aortic valve disorder, unspecified: Secondary | ICD-10-CM | POA: Diagnosis not present

## 2018-05-17 DIAGNOSIS — N189 Chronic kidney disease, unspecified: Secondary | ICD-10-CM | POA: Diagnosis not present

## 2018-05-17 DIAGNOSIS — G500 Trigeminal neuralgia: Secondary | ICD-10-CM | POA: Diagnosis not present

## 2018-05-17 DIAGNOSIS — I10 Essential (primary) hypertension: Secondary | ICD-10-CM | POA: Diagnosis not present

## 2018-05-17 DIAGNOSIS — E785 Hyperlipidemia, unspecified: Secondary | ICD-10-CM | POA: Diagnosis not present

## 2018-05-18 DIAGNOSIS — I5040 Unspecified combined systolic (congestive) and diastolic (congestive) heart failure: Secondary | ICD-10-CM | POA: Diagnosis not present

## 2018-05-18 DIAGNOSIS — G500 Trigeminal neuralgia: Secondary | ICD-10-CM | POA: Diagnosis not present

## 2018-05-18 DIAGNOSIS — N189 Chronic kidney disease, unspecified: Secondary | ICD-10-CM | POA: Diagnosis not present

## 2018-05-18 DIAGNOSIS — I10 Essential (primary) hypertension: Secondary | ICD-10-CM | POA: Diagnosis not present

## 2018-05-18 DIAGNOSIS — I359 Nonrheumatic aortic valve disorder, unspecified: Secondary | ICD-10-CM | POA: Diagnosis not present

## 2018-05-18 DIAGNOSIS — E785 Hyperlipidemia, unspecified: Secondary | ICD-10-CM | POA: Diagnosis not present

## 2018-05-19 IMAGING — CR DG ELBOW COMPLETE 3+V*L*
4 series · 4 of 4 positions shown · non-contrast
Comparison: None.

CLINICAL DATA: Fall yesterday with left elbow pain.

EXAM:
LEFT ELBOW - COMPLETE 3+ VIEW

[x elbow obl left (1 of 2)]
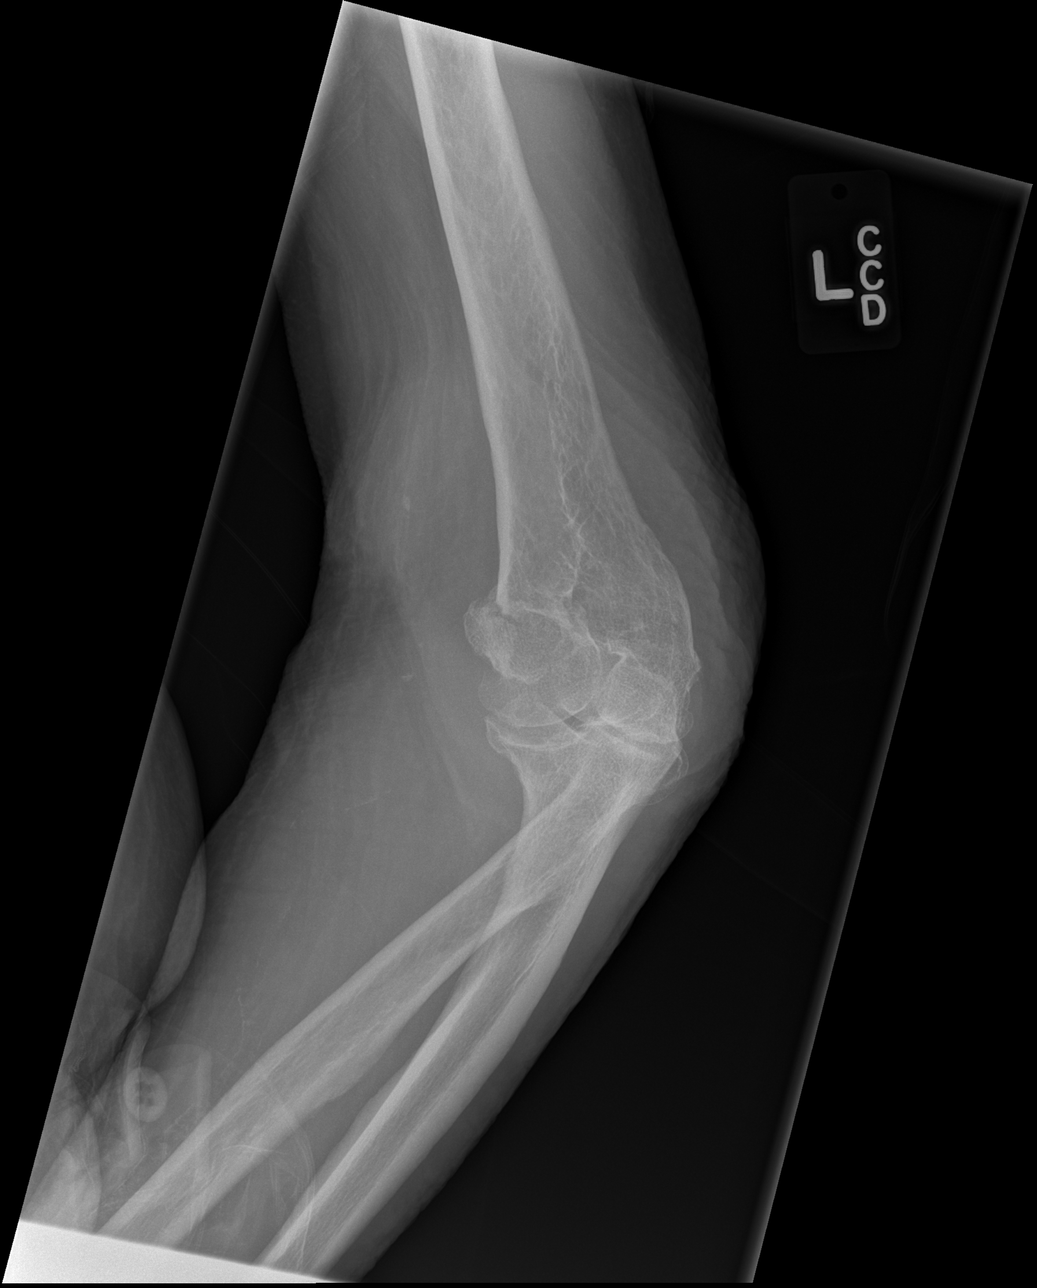

[x elbow ap left]
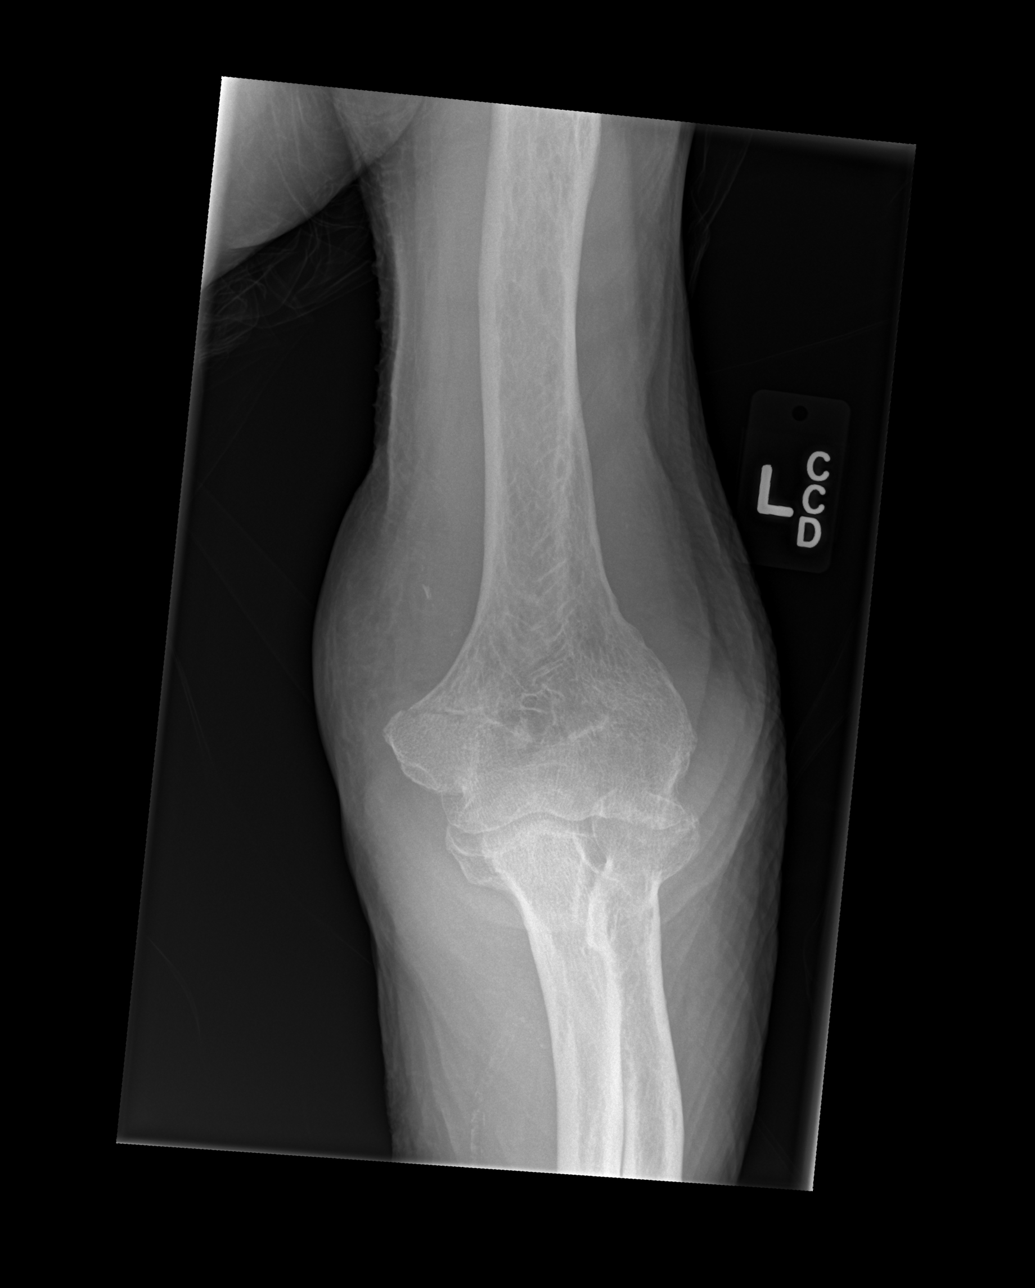

[x elbow obl left (2 of 2)]
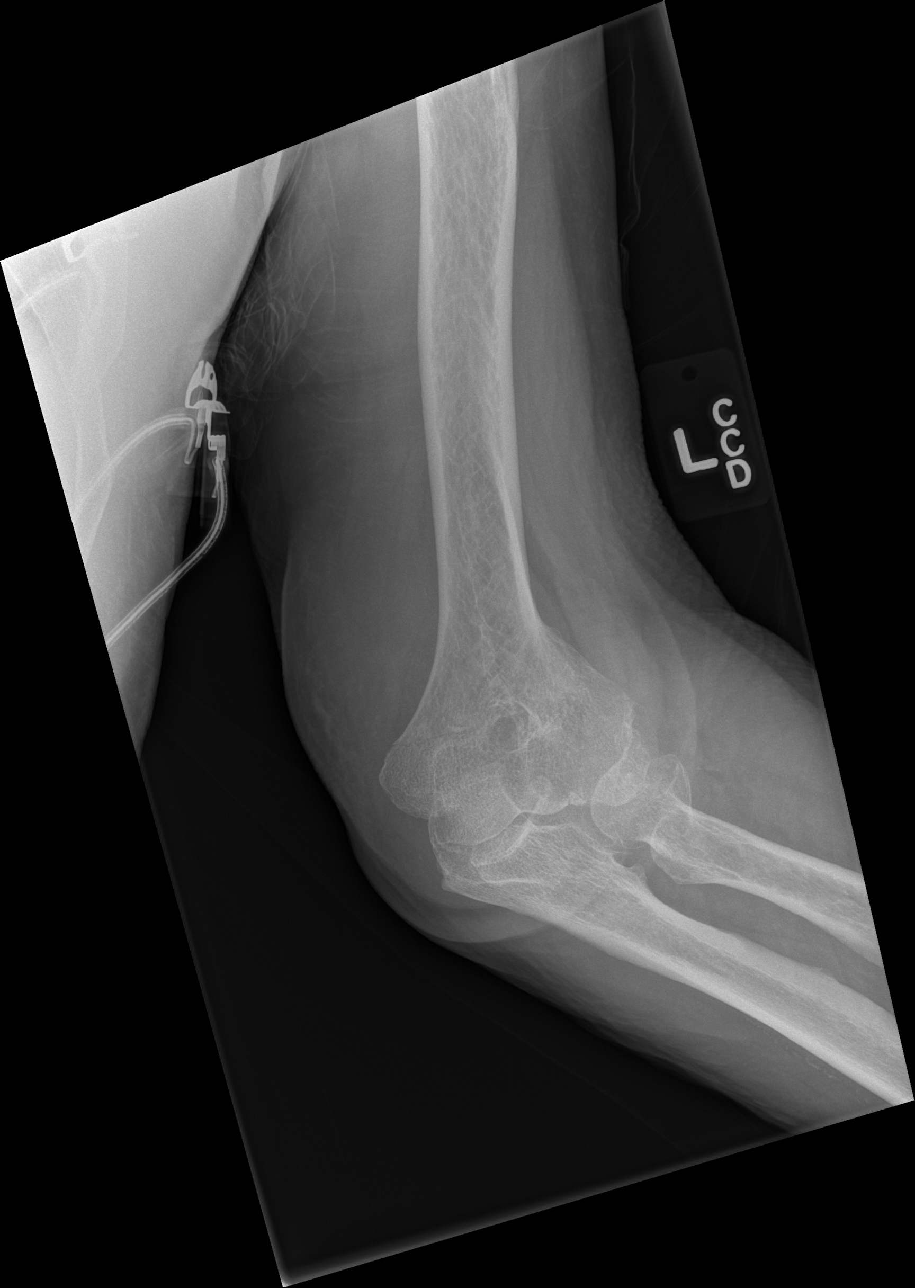

[x elbow lat left]
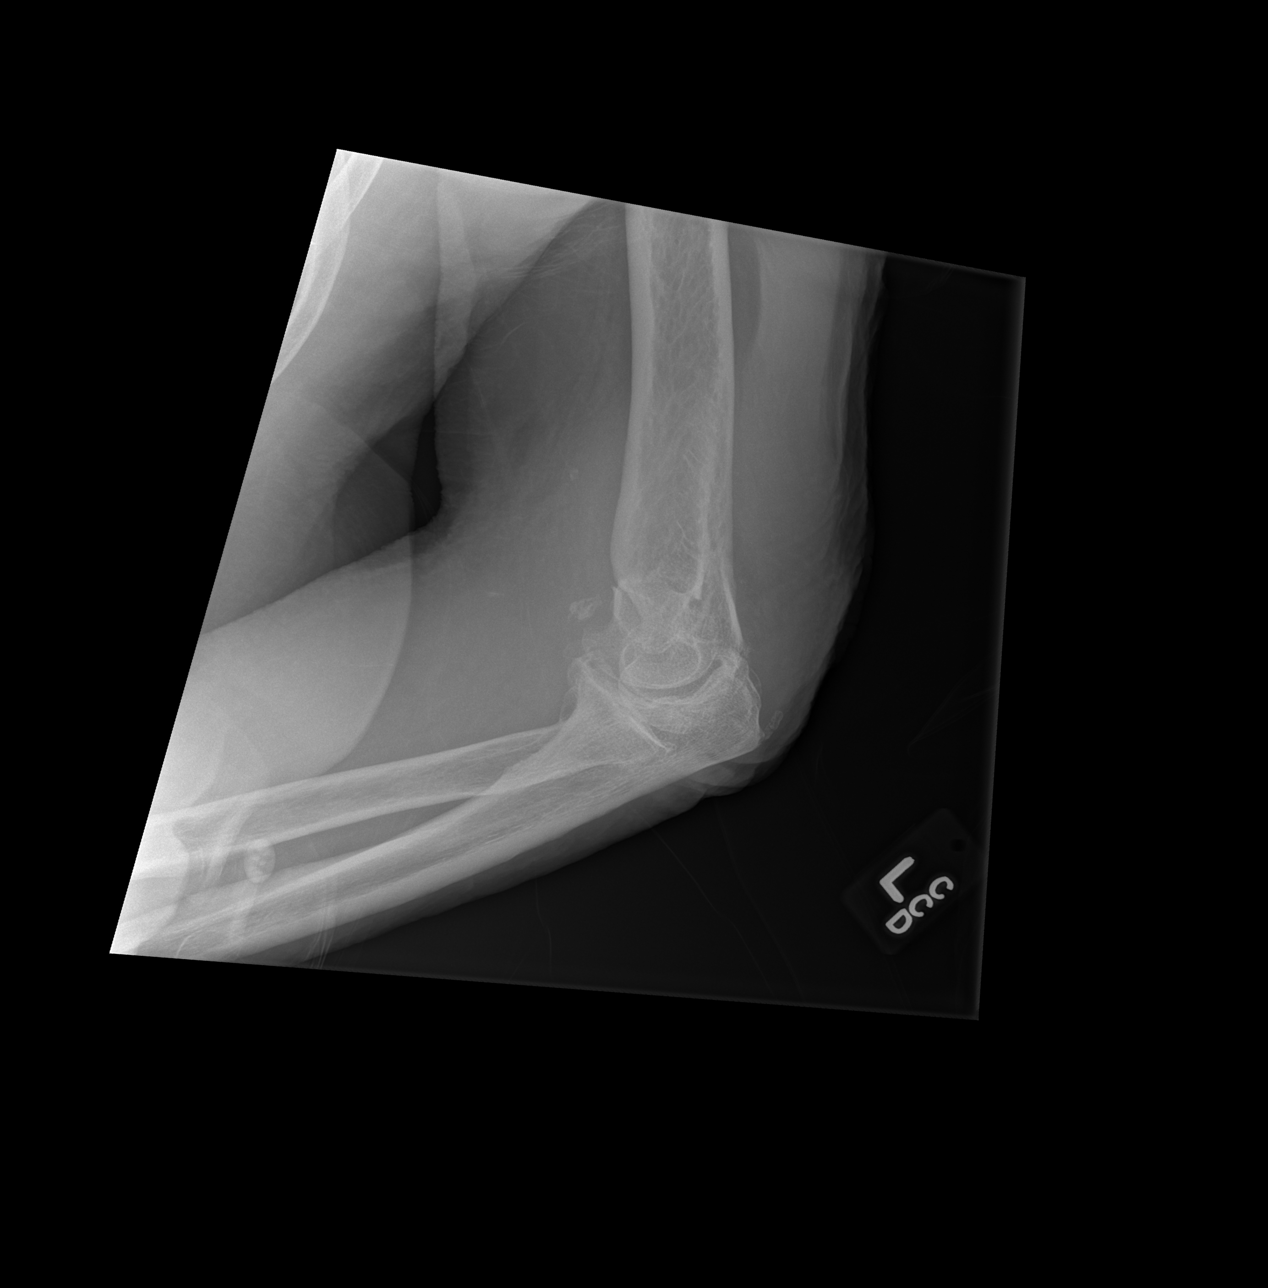

[4 of 4 positions shown; findings below may reference images not displayed]

FINDINGS: Nondisplaced supracondylar fracture in the left distal humerus with
left elbow joint effusion. No additional fracture. No dislocation.
No suspicious focal osseous lesion. Moderate osteoarthritis in the
left elbow joint. Small enthesophyte at the posterior left
olecranon. No radiopaque foreign body.
IMPRESSION: Nondisplaced supracondylar fracture in the left distal humerus with
left elbow joint effusion. No dislocation in the left elbow.

## 2018-05-22 ENCOUNTER — Ambulatory Visit: Payer: Medicare Other | Admitting: Neurology

## 2018-05-22 DIAGNOSIS — N189 Chronic kidney disease, unspecified: Secondary | ICD-10-CM | POA: Diagnosis not present

## 2018-05-22 DIAGNOSIS — I10 Essential (primary) hypertension: Secondary | ICD-10-CM | POA: Diagnosis not present

## 2018-05-22 DIAGNOSIS — E785 Hyperlipidemia, unspecified: Secondary | ICD-10-CM | POA: Diagnosis not present

## 2018-05-22 DIAGNOSIS — G500 Trigeminal neuralgia: Secondary | ICD-10-CM | POA: Diagnosis not present

## 2018-05-22 DIAGNOSIS — I359 Nonrheumatic aortic valve disorder, unspecified: Secondary | ICD-10-CM | POA: Diagnosis not present

## 2018-05-22 DIAGNOSIS — I5040 Unspecified combined systolic (congestive) and diastolic (congestive) heart failure: Secondary | ICD-10-CM | POA: Diagnosis not present

## 2018-05-25 DIAGNOSIS — I5040 Unspecified combined systolic (congestive) and diastolic (congestive) heart failure: Secondary | ICD-10-CM | POA: Diagnosis not present

## 2018-05-25 DIAGNOSIS — E785 Hyperlipidemia, unspecified: Secondary | ICD-10-CM | POA: Diagnosis not present

## 2018-05-25 DIAGNOSIS — N189 Chronic kidney disease, unspecified: Secondary | ICD-10-CM | POA: Diagnosis not present

## 2018-05-25 DIAGNOSIS — I10 Essential (primary) hypertension: Secondary | ICD-10-CM | POA: Diagnosis not present

## 2018-05-25 DIAGNOSIS — I359 Nonrheumatic aortic valve disorder, unspecified: Secondary | ICD-10-CM | POA: Diagnosis not present

## 2018-05-25 DIAGNOSIS — G500 Trigeminal neuralgia: Secondary | ICD-10-CM | POA: Diagnosis not present

## 2018-05-26 DIAGNOSIS — N189 Chronic kidney disease, unspecified: Secondary | ICD-10-CM | POA: Diagnosis not present

## 2018-05-26 DIAGNOSIS — I5040 Unspecified combined systolic (congestive) and diastolic (congestive) heart failure: Secondary | ICD-10-CM | POA: Diagnosis not present

## 2018-05-26 DIAGNOSIS — I359 Nonrheumatic aortic valve disorder, unspecified: Secondary | ICD-10-CM | POA: Diagnosis not present

## 2018-05-26 DIAGNOSIS — E785 Hyperlipidemia, unspecified: Secondary | ICD-10-CM | POA: Diagnosis not present

## 2018-05-26 DIAGNOSIS — I10 Essential (primary) hypertension: Secondary | ICD-10-CM | POA: Diagnosis not present

## 2018-05-26 DIAGNOSIS — G500 Trigeminal neuralgia: Secondary | ICD-10-CM | POA: Diagnosis not present

## 2018-05-29 DIAGNOSIS — N189 Chronic kidney disease, unspecified: Secondary | ICD-10-CM | POA: Diagnosis not present

## 2018-05-29 DIAGNOSIS — E785 Hyperlipidemia, unspecified: Secondary | ICD-10-CM | POA: Diagnosis not present

## 2018-05-29 DIAGNOSIS — I10 Essential (primary) hypertension: Secondary | ICD-10-CM | POA: Diagnosis not present

## 2018-05-29 DIAGNOSIS — I5040 Unspecified combined systolic (congestive) and diastolic (congestive) heart failure: Secondary | ICD-10-CM | POA: Diagnosis not present

## 2018-05-29 DIAGNOSIS — I359 Nonrheumatic aortic valve disorder, unspecified: Secondary | ICD-10-CM | POA: Diagnosis not present

## 2018-05-29 DIAGNOSIS — G500 Trigeminal neuralgia: Secondary | ICD-10-CM | POA: Diagnosis not present

## 2018-06-01 DIAGNOSIS — G500 Trigeminal neuralgia: Secondary | ICD-10-CM | POA: Diagnosis not present

## 2018-06-01 DIAGNOSIS — E785 Hyperlipidemia, unspecified: Secondary | ICD-10-CM | POA: Diagnosis not present

## 2018-06-01 DIAGNOSIS — N189 Chronic kidney disease, unspecified: Secondary | ICD-10-CM | POA: Diagnosis not present

## 2018-06-01 DIAGNOSIS — I10 Essential (primary) hypertension: Secondary | ICD-10-CM | POA: Diagnosis not present

## 2018-06-01 DIAGNOSIS — I359 Nonrheumatic aortic valve disorder, unspecified: Secondary | ICD-10-CM | POA: Diagnosis not present

## 2018-06-01 DIAGNOSIS — I5040 Unspecified combined systolic (congestive) and diastolic (congestive) heart failure: Secondary | ICD-10-CM | POA: Diagnosis not present

## 2018-06-05 DIAGNOSIS — N189 Chronic kidney disease, unspecified: Secondary | ICD-10-CM | POA: Diagnosis not present

## 2018-06-05 DIAGNOSIS — E785 Hyperlipidemia, unspecified: Secondary | ICD-10-CM | POA: Diagnosis not present

## 2018-06-05 DIAGNOSIS — I359 Nonrheumatic aortic valve disorder, unspecified: Secondary | ICD-10-CM | POA: Diagnosis not present

## 2018-06-05 DIAGNOSIS — I10 Essential (primary) hypertension: Secondary | ICD-10-CM | POA: Diagnosis not present

## 2018-06-05 DIAGNOSIS — G500 Trigeminal neuralgia: Secondary | ICD-10-CM | POA: Diagnosis not present

## 2018-06-05 DIAGNOSIS — I5040 Unspecified combined systolic (congestive) and diastolic (congestive) heart failure: Secondary | ICD-10-CM | POA: Diagnosis not present

## 2018-06-08 DIAGNOSIS — E785 Hyperlipidemia, unspecified: Secondary | ICD-10-CM | POA: Diagnosis not present

## 2018-06-08 DIAGNOSIS — I10 Essential (primary) hypertension: Secondary | ICD-10-CM | POA: Diagnosis not present

## 2018-06-08 DIAGNOSIS — G500 Trigeminal neuralgia: Secondary | ICD-10-CM | POA: Diagnosis not present

## 2018-06-08 DIAGNOSIS — I359 Nonrheumatic aortic valve disorder, unspecified: Secondary | ICD-10-CM | POA: Diagnosis not present

## 2018-06-08 DIAGNOSIS — N189 Chronic kidney disease, unspecified: Secondary | ICD-10-CM | POA: Diagnosis not present

## 2018-06-08 DIAGNOSIS — I5040 Unspecified combined systolic (congestive) and diastolic (congestive) heart failure: Secondary | ICD-10-CM | POA: Diagnosis not present

## 2018-06-09 DIAGNOSIS — N189 Chronic kidney disease, unspecified: Secondary | ICD-10-CM | POA: Diagnosis not present

## 2018-06-09 DIAGNOSIS — G500 Trigeminal neuralgia: Secondary | ICD-10-CM | POA: Diagnosis not present

## 2018-06-09 DIAGNOSIS — E785 Hyperlipidemia, unspecified: Secondary | ICD-10-CM | POA: Diagnosis not present

## 2018-06-09 DIAGNOSIS — I5040 Unspecified combined systolic (congestive) and diastolic (congestive) heart failure: Secondary | ICD-10-CM | POA: Diagnosis not present

## 2018-06-09 DIAGNOSIS — I10 Essential (primary) hypertension: Secondary | ICD-10-CM | POA: Diagnosis not present

## 2018-06-09 DIAGNOSIS — I359 Nonrheumatic aortic valve disorder, unspecified: Secondary | ICD-10-CM | POA: Diagnosis not present

## 2018-06-14 DIAGNOSIS — G309 Alzheimer's disease, unspecified: Secondary | ICD-10-CM | POA: Diagnosis not present

## 2018-06-14 DIAGNOSIS — N400 Enlarged prostate without lower urinary tract symptoms: Secondary | ICD-10-CM | POA: Diagnosis not present

## 2018-06-14 DIAGNOSIS — I359 Nonrheumatic aortic valve disorder, unspecified: Secondary | ICD-10-CM | POA: Diagnosis not present

## 2018-06-14 DIAGNOSIS — F0280 Dementia in other diseases classified elsewhere without behavioral disturbance: Secondary | ICD-10-CM | POA: Diagnosis not present

## 2018-06-14 DIAGNOSIS — I5040 Unspecified combined systolic (congestive) and diastolic (congestive) heart failure: Secondary | ICD-10-CM | POA: Diagnosis not present

## 2018-06-14 DIAGNOSIS — N189 Chronic kidney disease, unspecified: Secondary | ICD-10-CM | POA: Diagnosis not present

## 2018-06-14 DIAGNOSIS — E785 Hyperlipidemia, unspecified: Secondary | ICD-10-CM | POA: Diagnosis not present

## 2018-06-14 DIAGNOSIS — I130 Hypertensive heart and chronic kidney disease with heart failure and stage 1 through stage 4 chronic kidney disease, or unspecified chronic kidney disease: Secondary | ICD-10-CM | POA: Diagnosis not present

## 2018-06-14 DIAGNOSIS — G500 Trigeminal neuralgia: Secondary | ICD-10-CM | POA: Diagnosis not present

## 2018-06-14 DIAGNOSIS — N2889 Other specified disorders of kidney and ureter: Secondary | ICD-10-CM | POA: Diagnosis not present

## 2018-06-15 DIAGNOSIS — N189 Chronic kidney disease, unspecified: Secondary | ICD-10-CM | POA: Diagnosis not present

## 2018-06-15 DIAGNOSIS — I130 Hypertensive heart and chronic kidney disease with heart failure and stage 1 through stage 4 chronic kidney disease, or unspecified chronic kidney disease: Secondary | ICD-10-CM | POA: Diagnosis not present

## 2018-06-15 DIAGNOSIS — I5040 Unspecified combined systolic (congestive) and diastolic (congestive) heart failure: Secondary | ICD-10-CM | POA: Diagnosis not present

## 2018-06-15 DIAGNOSIS — E785 Hyperlipidemia, unspecified: Secondary | ICD-10-CM | POA: Diagnosis not present

## 2018-06-15 DIAGNOSIS — G500 Trigeminal neuralgia: Secondary | ICD-10-CM | POA: Diagnosis not present

## 2018-06-15 DIAGNOSIS — I359 Nonrheumatic aortic valve disorder, unspecified: Secondary | ICD-10-CM | POA: Diagnosis not present

## 2018-07-14 DIAGNOSIS — G309 Alzheimer's disease, unspecified: Secondary | ICD-10-CM | POA: Diagnosis not present

## 2018-07-14 DIAGNOSIS — N2889 Other specified disorders of kidney and ureter: Secondary | ICD-10-CM | POA: Diagnosis not present

## 2018-07-14 DIAGNOSIS — F0280 Dementia in other diseases classified elsewhere without behavioral disturbance: Secondary | ICD-10-CM | POA: Diagnosis not present

## 2018-07-14 DIAGNOSIS — N400 Enlarged prostate without lower urinary tract symptoms: Secondary | ICD-10-CM | POA: Diagnosis not present

## 2018-07-14 DIAGNOSIS — G500 Trigeminal neuralgia: Secondary | ICD-10-CM | POA: Diagnosis not present

## 2018-07-14 DIAGNOSIS — I359 Nonrheumatic aortic valve disorder, unspecified: Secondary | ICD-10-CM | POA: Diagnosis not present

## 2018-07-14 DIAGNOSIS — I5040 Unspecified combined systolic (congestive) and diastolic (congestive) heart failure: Secondary | ICD-10-CM | POA: Diagnosis not present

## 2018-07-14 DIAGNOSIS — N189 Chronic kidney disease, unspecified: Secondary | ICD-10-CM | POA: Diagnosis not present

## 2018-07-14 DIAGNOSIS — I130 Hypertensive heart and chronic kidney disease with heart failure and stage 1 through stage 4 chronic kidney disease, or unspecified chronic kidney disease: Secondary | ICD-10-CM | POA: Diagnosis not present

## 2018-07-14 DIAGNOSIS — E785 Hyperlipidemia, unspecified: Secondary | ICD-10-CM | POA: Diagnosis not present

## 2018-07-16 DIAGNOSIS — E785 Hyperlipidemia, unspecified: Secondary | ICD-10-CM | POA: Diagnosis not present

## 2018-07-16 DIAGNOSIS — N189 Chronic kidney disease, unspecified: Secondary | ICD-10-CM | POA: Diagnosis not present

## 2018-07-16 DIAGNOSIS — I130 Hypertensive heart and chronic kidney disease with heart failure and stage 1 through stage 4 chronic kidney disease, or unspecified chronic kidney disease: Secondary | ICD-10-CM | POA: Diagnosis not present

## 2018-07-16 DIAGNOSIS — I5040 Unspecified combined systolic (congestive) and diastolic (congestive) heart failure: Secondary | ICD-10-CM | POA: Diagnosis not present

## 2018-07-16 DIAGNOSIS — I359 Nonrheumatic aortic valve disorder, unspecified: Secondary | ICD-10-CM | POA: Diagnosis not present

## 2018-07-16 DIAGNOSIS — G500 Trigeminal neuralgia: Secondary | ICD-10-CM | POA: Diagnosis not present

## 2018-07-23 ENCOUNTER — Telehealth: Payer: Self-pay | Admitting: Cardiology

## 2018-07-23 NOTE — Telephone Encounter (Signed)
Noted  

## 2018-07-23 NOTE — Telephone Encounter (Signed)
Carlos from hospice called and says that pt's daughter called this morning reporting that pt wt has gone down 5lbs from yesterday and pt is a little dizzy. (158->153). She wanted to know if they can hold lasix today. I agree that lasix should be held today. Hospice Case manager is to see pt tomorrow. Patient is still a little short of breath and the hospice nurse is calling in some Ativan as pt is somewhat anxious (his brother just died of a heart attack).  I advised that they can use wt and symptoms to occasionally skip a day of lasix. They can also call for a virtual visit if they feel lasix daily dosing needs to be adjusted.

## 2018-07-24 DIAGNOSIS — I359 Nonrheumatic aortic valve disorder, unspecified: Secondary | ICD-10-CM | POA: Diagnosis not present

## 2018-07-24 DIAGNOSIS — I130 Hypertensive heart and chronic kidney disease with heart failure and stage 1 through stage 4 chronic kidney disease, or unspecified chronic kidney disease: Secondary | ICD-10-CM | POA: Diagnosis not present

## 2018-07-24 DIAGNOSIS — I5040 Unspecified combined systolic (congestive) and diastolic (congestive) heart failure: Secondary | ICD-10-CM | POA: Diagnosis not present

## 2018-07-24 DIAGNOSIS — N189 Chronic kidney disease, unspecified: Secondary | ICD-10-CM | POA: Diagnosis not present

## 2018-07-24 DIAGNOSIS — G500 Trigeminal neuralgia: Secondary | ICD-10-CM | POA: Diagnosis not present

## 2018-07-24 DIAGNOSIS — E785 Hyperlipidemia, unspecified: Secondary | ICD-10-CM | POA: Diagnosis not present

## 2018-08-14 DEATH — deceased

## 2020-04-19 IMAGING — DX DG CHEST 2V
2 series · 2 of 2 positions shown · non-contrast
Comparison: 12/27/2017

CLINICAL DATA: Short of breath for several days

EXAM:
CHEST - 2 VIEW

[dg chest 2 view (1 of 2)]
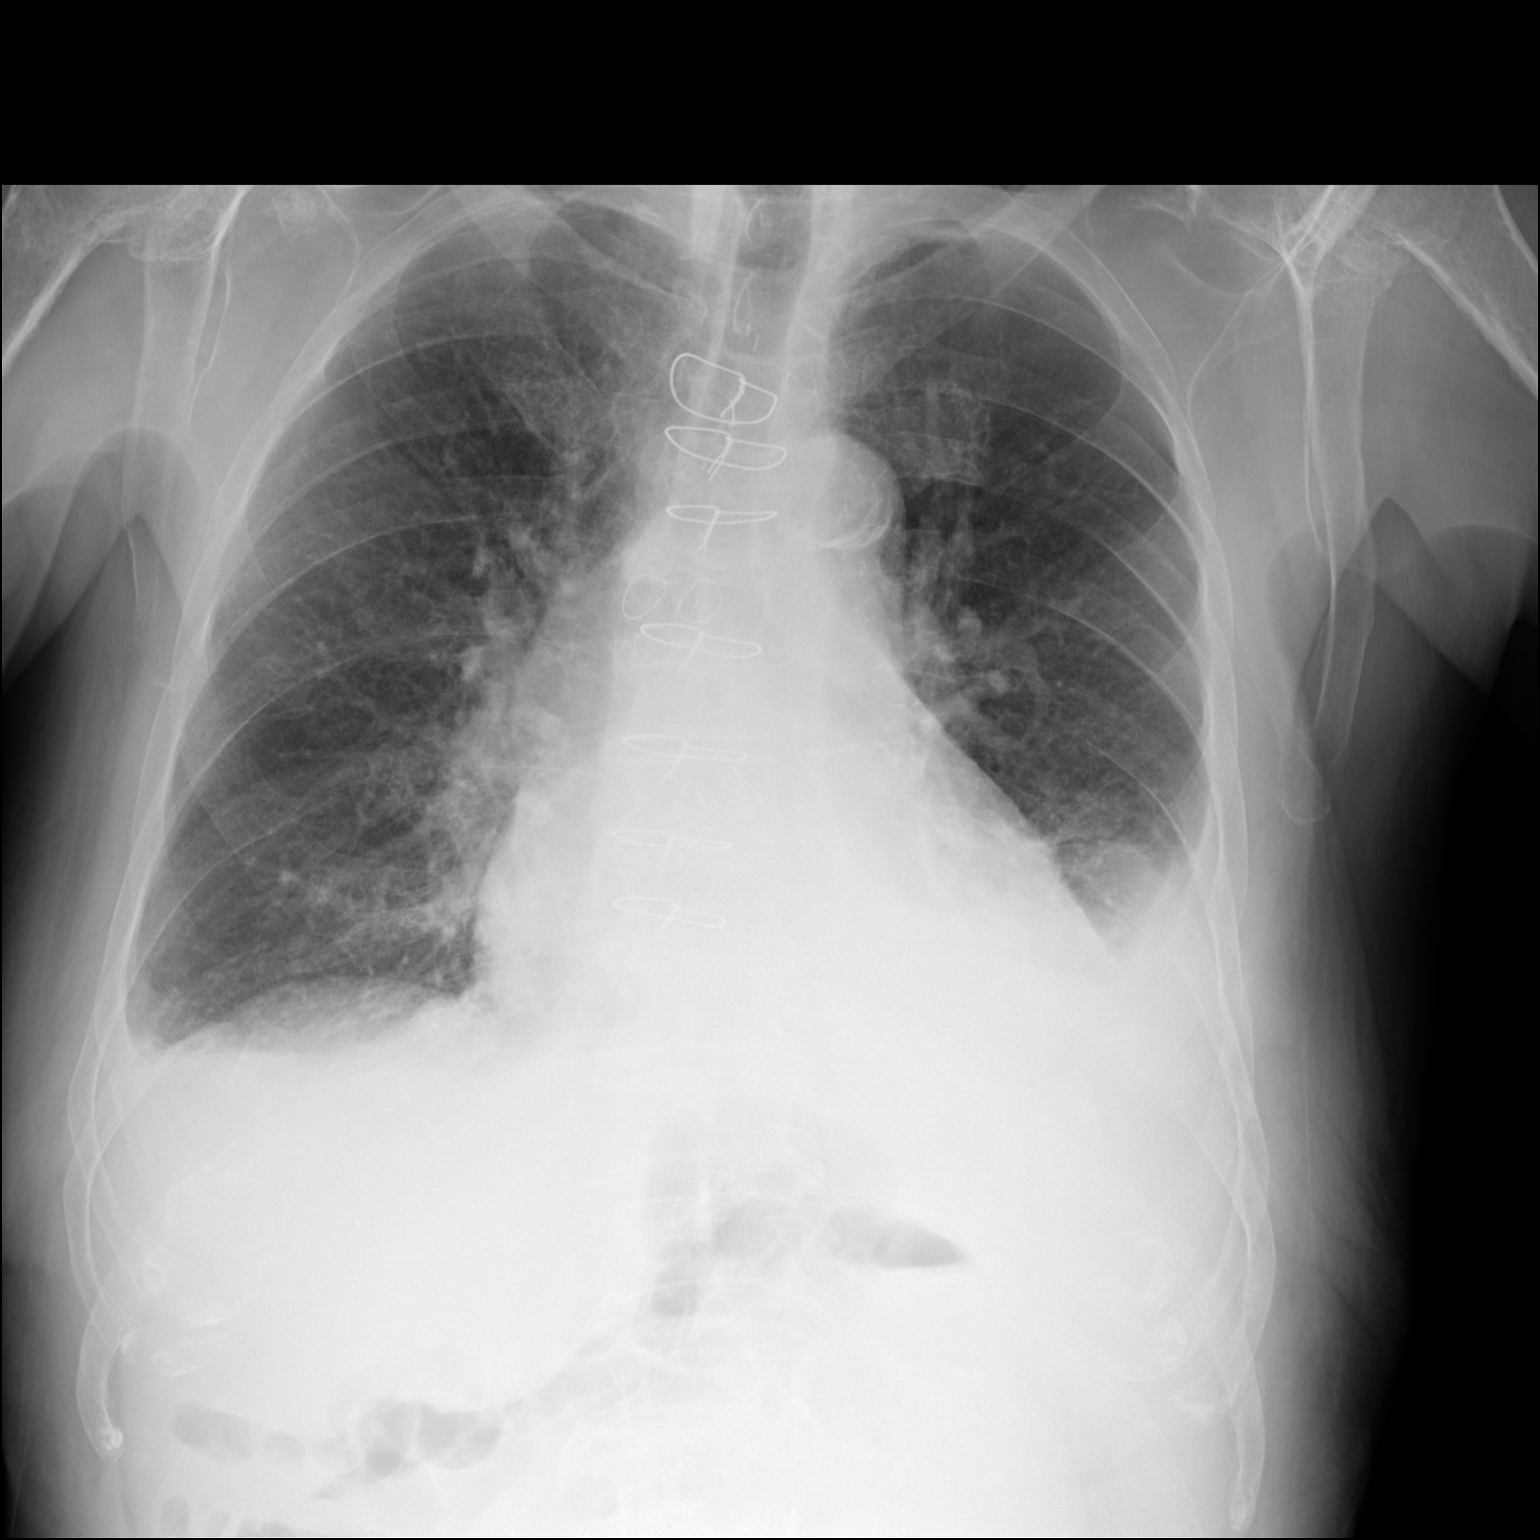

[dg chest 2 view (2 of 2)]
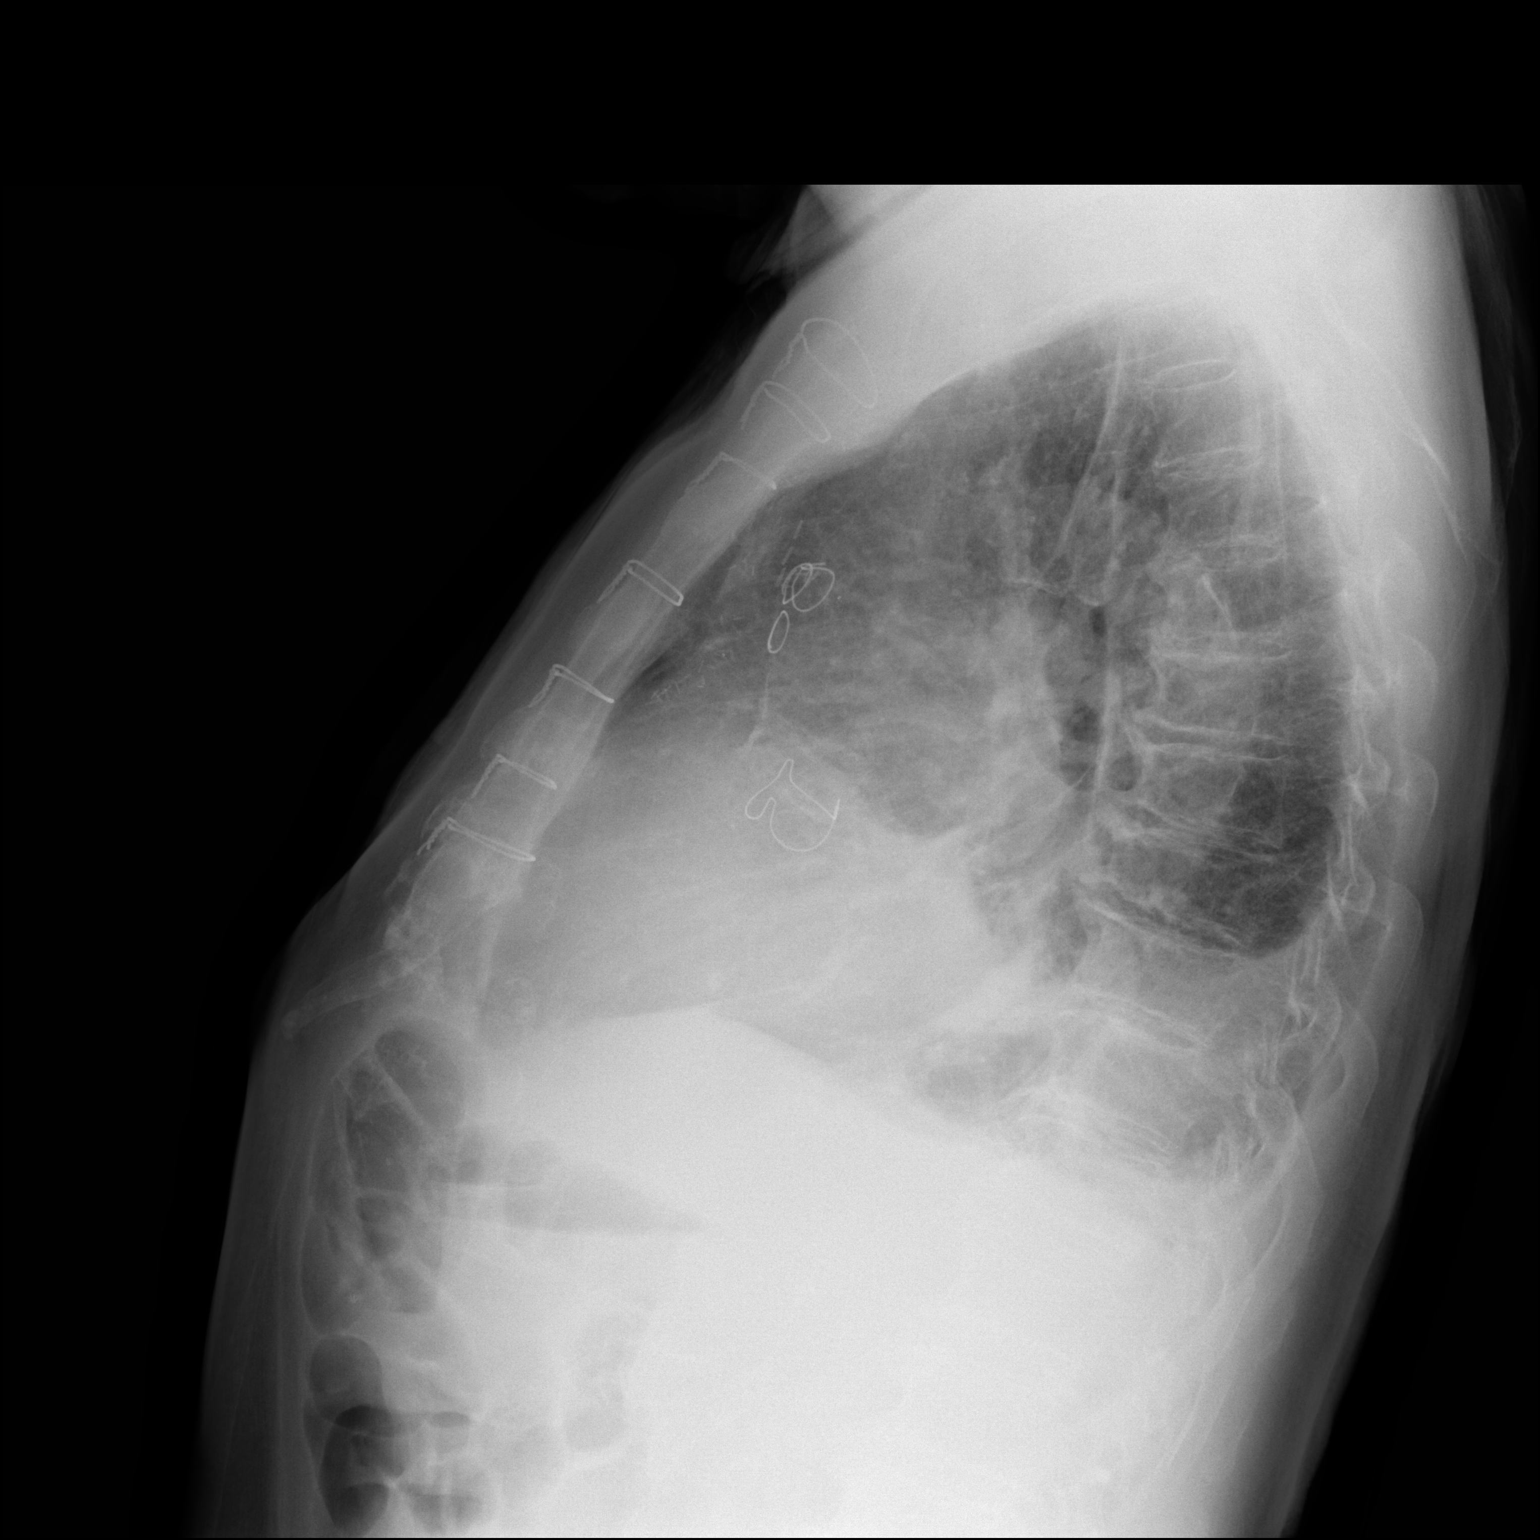

[2 of 2 positions shown; findings below may reference images not displayed]

FINDINGS: The heart is moderately enlarged. Vascular congestion. Bibasilar
hazy airspace disease. Small bilateral pleural effusions left
greater than right. Postoperative changes from CABG. No
pneumothorax.
IMPRESSION: Mild volume overload with mild bibasilar edema and small bilateral
effusions.
